# Patient Record
Sex: Female | Born: 1990 | Race: Black or African American | Hispanic: No | Marital: Single | Smoking: Former smoker
Health system: Southern US, Community
[De-identification: ages and names within clinical notes are randomized; demographics above are authoritative.]

## PROBLEM LIST (undated history)

## (undated) ENCOUNTER — Inpatient Hospital Stay: Payer: Self-pay

## (undated) DIAGNOSIS — Z789 Other specified health status: Secondary | ICD-10-CM

## (undated) DIAGNOSIS — O24419 Gestational diabetes mellitus in pregnancy, unspecified control: Secondary | ICD-10-CM

## (undated) DIAGNOSIS — Z803 Family history of malignant neoplasm of breast: Secondary | ICD-10-CM

## (undated) DIAGNOSIS — I10 Essential (primary) hypertension: Secondary | ICD-10-CM

## (undated) HISTORY — DX: Family history of malignant neoplasm of breast: Z80.3

## (undated) HISTORY — DX: Gestational diabetes mellitus in pregnancy, unspecified control: O24.419

## (undated) HISTORY — PX: DILATION AND CURETTAGE OF UTERUS: SHX78

---
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
## 2009-04-16 ENCOUNTER — Emergency Department: Payer: Self-pay | Admitting: Emergency Medicine

## 2011-06-24 ENCOUNTER — Emergency Department: Payer: Self-pay | Admitting: Internal Medicine

## 2013-05-08 ENCOUNTER — Emergency Department: Payer: Self-pay | Admitting: Emergency Medicine

## 2013-05-08 LAB — URINALYSIS, COMPLETE
Bilirubin,UR: NEGATIVE
Blood: NEGATIVE
Glucose,UR: NEGATIVE mg/dL (ref 0–75)
Nitrite: NEGATIVE
Ph: 5 (ref 4.5–8.0)
Protein: 30
RBC,UR: 5 /HPF (ref 0–5)
Specific Gravity: 1.027 (ref 1.003–1.030)
Squamous Epithelial: 6
WBC UR: 14 /HPF (ref 0–5)

## 2013-05-08 LAB — COMPREHENSIVE METABOLIC PANEL
Albumin: 4.2 g/dL (ref 3.4–5.0)
Alkaline Phosphatase: 73 U/L (ref 50–136)
Anion Gap: 6 — ABNORMAL LOW (ref 7–16)
BUN: 14 mg/dL (ref 7–18)
Bilirubin,Total: 0.8 mg/dL (ref 0.2–1.0)
Calcium, Total: 9.5 mg/dL (ref 8.5–10.1)
Chloride: 104 mmol/L (ref 98–107)
Co2: 27 mmol/L (ref 21–32)
Creatinine: 0.74 mg/dL (ref 0.60–1.30)
EGFR (African American): >60
EGFR (Non-African Amer.): >60
Glucose: 96 mg/dL (ref 65–99)
Osmolality: 274 (ref 275–301)
Potassium: 3.2 mmol/L — ABNORMAL LOW (ref 3.5–5.1)
SGOT(AST): 22 U/L (ref 15–37)
SGPT (ALT): 17 U/L (ref 12–78)
Sodium: 137 mmol/L (ref 136–145)
Total Protein: 8.7 g/dL — ABNORMAL HIGH (ref 6.4–8.2)

## 2013-05-08 LAB — CBC
HCT: 41.1 % (ref 35.0–47.0)
HGB: 14.6 g/dL (ref 12.0–16.0)
MCH: 30.3 pg (ref 26.0–34.0)
MCHC: 35.6 g/dL (ref 32.0–36.0)
MCV: 85 fL (ref 80–100)
Platelet: 278 10*3/uL (ref 150–440)
RBC: 4.82 10*6/uL (ref 3.80–5.20)
RDW: 13 % (ref 11.5–14.5)
WBC: 7.7 10*3/uL (ref 3.6–11.0)

## 2013-05-08 LAB — PREGNANCY, URINE: Pregnancy Test, Urine: NEGATIVE m[IU]/mL

## 2013-07-13 ENCOUNTER — Emergency Department: Payer: Self-pay | Admitting: Emergency Medicine

## 2013-07-13 LAB — CBC
HCT: 34.2 % — ABNORMAL LOW (ref 35.0–47.0)
HGB: 12.3 g/dL (ref 12.0–16.0)
MCH: 30.3 pg (ref 26.0–34.0)
MCHC: 36.1 g/dL — ABNORMAL HIGH (ref 32.0–36.0)
MCV: 84 fL (ref 80–100)
Platelet: 268 10*3/uL (ref 150–440)
RBC: 4.08 10*6/uL (ref 3.80–5.20)
RDW: 12.7 % (ref 11.5–14.5)
WBC: 7.7 10*3/uL (ref 3.6–11.0)

## 2013-07-13 LAB — HCG, QUANTITATIVE, PREGNANCY: Beta Hcg, Quant.: 16304 m[IU]/mL — ABNORMAL HIGH

## 2014-01-21 ENCOUNTER — Inpatient Hospital Stay: Payer: Self-pay

## 2014-01-21 LAB — URINALYSIS, COMPLETE
Bacteria: NONE SEEN
Bilirubin,UR: NEGATIVE
Blood: NEGATIVE
Glucose,UR: NEGATIVE mg/dL (ref 0–75)
Leukocyte Esterase: NEGATIVE
Nitrite: NEGATIVE
Ph: 7 (ref 4.5–8.0)
Protein: 30
RBC,UR: 1 /HPF (ref 0–5)
Specific Gravity: 1.027 (ref 1.003–1.030)
Squamous Epithelial: 9
WBC UR: 2 /HPF (ref 0–5)

## 2014-01-21 LAB — COMPREHENSIVE METABOLIC PANEL
Albumin: 3.9 g/dL (ref 3.4–5.0)
Alkaline Phosphatase: 53 U/L
Anion Gap: 12 (ref 7–16)
BUN: 10 mg/dL (ref 7–18)
Bilirubin,Total: 0.5 mg/dL (ref 0.2–1.0)
Calcium, Total: 9.3 mg/dL (ref 8.5–10.1)
Chloride: 105 mmol/L (ref 98–107)
Co2: 22 mmol/L (ref 21–32)
Creatinine: 0.68 mg/dL (ref 0.60–1.30)
EGFR (African American): >60
EGFR (Non-African Amer.): >60
Glucose: 84 mg/dL (ref 65–99)
Osmolality: 276 (ref 275–301)
Potassium: 3.2 mmol/L — ABNORMAL LOW (ref 3.5–5.1)
SGOT(AST): 15 U/L (ref 15–37)
SGPT (ALT): 13 U/L (ref 12–78)
Sodium: 139 mmol/L (ref 136–145)
Total Protein: 8.4 g/dL — ABNORMAL HIGH (ref 6.4–8.2)

## 2014-01-21 LAB — LIPASE, BLOOD: Lipase: 111 U/L (ref 73–393)

## 2014-01-21 LAB — CBC
HCT: 37.5 % (ref 35.0–47.0)
HGB: 13.2 g/dL (ref 12.0–16.0)
MCH: 29.8 pg (ref 26.0–34.0)
MCHC: 35.1 g/dL (ref 32.0–36.0)
MCV: 85 fL (ref 80–100)
Platelet: 270 10*3/uL (ref 150–440)
RBC: 4.43 10*6/uL (ref 3.80–5.20)
RDW: 12.8 % (ref 11.5–14.5)
WBC: 7.7 10*3/uL (ref 3.6–11.0)

## 2014-01-21 LAB — HCG, QUANTITATIVE, PREGNANCY: Beta Hcg, Quant.: 31008 m[IU]/mL — ABNORMAL HIGH

## 2014-01-21 LAB — MAGNESIUM: Magnesium: 1.6 mg/dL — ABNORMAL LOW

## 2014-01-22 LAB — DRUG SCREEN, URINE
Amphetamines, Ur Screen: NEGATIVE (ref ?–1000)
Barbiturates, Ur Screen: NEGATIVE (ref ?–200)
Benzodiazepine, Ur Scrn: NEGATIVE (ref ?–200)
Cannabinoid 50 Ng, Ur ~~LOC~~: POSITIVE (ref ?–50)
Cocaine Metabolite,Ur ~~LOC~~: NEGATIVE (ref ?–300)
MDMA (Ecstasy)Ur Screen: NEGATIVE (ref ?–500)
Methadone, Ur Screen: NEGATIVE (ref ?–300)
Opiate, Ur Screen: NEGATIVE (ref ?–300)
Phencyclidine (PCP) Ur S: NEGATIVE (ref ?–25)
Tricyclic, Ur Screen: NEGATIVE (ref ?–1000)

## 2014-01-22 LAB — BASIC METABOLIC PANEL
Anion Gap: 8 (ref 7–16)
BUN: 3 mg/dL — ABNORMAL LOW (ref 7–18)
Calcium, Total: 9.2 mg/dL (ref 8.5–10.1)
Chloride: 107 mmol/L (ref 98–107)
Co2: 23 mmol/L (ref 21–32)
Creatinine: 0.5 mg/dL — ABNORMAL LOW (ref 0.60–1.30)
EGFR (African American): >60
EGFR (Non-African Amer.): >60
Glucose: 103 mg/dL — ABNORMAL HIGH (ref 65–99)
Osmolality: 272 (ref 275–301)
Potassium: 3.8 mmol/L (ref 3.5–5.1)
Sodium: 138 mmol/L (ref 136–145)

## 2014-01-22 LAB — MAGNESIUM: Magnesium: 2.3 mg/dL

## 2014-01-24 LAB — KETONES, URINE: Ketone: NEGATIVE

## 2014-01-29 ENCOUNTER — Emergency Department: Payer: Self-pay | Admitting: Emergency Medicine

## 2014-01-29 LAB — CBC
HCT: 37.4 % (ref 35.0–47.0)
HGB: 13.2 g/dL (ref 12.0–16.0)
MCH: 29.8 pg (ref 26.0–34.0)
MCHC: 35.4 g/dL (ref 32.0–36.0)
MCV: 84 fL (ref 80–100)
Platelet: 296 10*3/uL (ref 150–440)
RBC: 4.44 10*6/uL (ref 3.80–5.20)
RDW: 12.9 % (ref 11.5–14.5)
WBC: 7 10*3/uL (ref 3.6–11.0)

## 2014-01-29 LAB — URINALYSIS, COMPLETE
Bacteria: NONE SEEN
Bilirubin,UR: NEGATIVE
Glucose,UR: NEGATIVE mg/dL (ref 0–75)
Leukocyte Esterase: NEGATIVE
Nitrite: NEGATIVE
Ph: 6 (ref 4.5–8.0)
Protein: 30
RBC,UR: 2 /HPF (ref 0–5)
Specific Gravity: 1.019 (ref 1.003–1.030)
Squamous Epithelial: 2
WBC UR: 4 /HPF (ref 0–5)

## 2014-01-29 LAB — GC/CHLAMYDIA PROBE AMP

## 2014-01-29 LAB — WET PREP, GENITAL

## 2014-01-29 LAB — HCG, QUANTITATIVE, PREGNANCY: Beta Hcg, Quant.: 51865 m[IU]/mL — ABNORMAL HIGH

## 2014-02-28 ENCOUNTER — Emergency Department: Payer: Self-pay | Admitting: Emergency Medicine

## 2014-02-28 LAB — CBC WITH DIFFERENTIAL/PLATELET
Basophil #: 0.1 10*3/uL (ref 0.0–0.1)
Basophil %: 1.1 %
Eosinophil #: 0.1 10*3/uL (ref 0.0–0.7)
Eosinophil %: 0.8 %
HCT: 35 % (ref 35.0–47.0)
HGB: 12.2 g/dL (ref 12.0–16.0)
Lymphocyte #: 3.4 10*3/uL (ref 1.0–3.6)
Lymphocyte %: 51.3 %
MCH: 29.5 pg (ref 26.0–34.0)
MCHC: 34.8 g/dL (ref 32.0–36.0)
MCV: 85 fL (ref 80–100)
Monocyte #: 0.3 x10 3/mm (ref 0.2–0.9)
Monocyte %: 5.2 %
Neutrophil #: 2.8 10*3/uL (ref 1.4–6.5)
Neutrophil %: 41.6 %
Platelet: 291 10*3/uL (ref 150–440)
RBC: 4.12 10*6/uL (ref 3.80–5.20)
RDW: 13.1 % (ref 11.5–14.5)
WBC: 6.7 10*3/uL (ref 3.6–11.0)

## 2014-02-28 LAB — URINALYSIS, COMPLETE
Bacteria: NONE SEEN
Bilirubin,UR: NEGATIVE
Blood: NEGATIVE
Glucose,UR: NEGATIVE mg/dL (ref 0–75)
Leukocyte Esterase: NEGATIVE
Nitrite: NEGATIVE
Ph: 7 (ref 4.5–8.0)
Protein: NEGATIVE
RBC,UR: NONE SEEN /HPF (ref 0–5)
Specific Gravity: 1.004 (ref 1.003–1.030)
Squamous Epithelial: 1
WBC UR: NONE SEEN /HPF (ref 0–5)

## 2014-02-28 LAB — GC/CHLAMYDIA PROBE AMP

## 2014-02-28 LAB — HCG, QUANTITATIVE, PREGNANCY: Beta Hcg, Quant.: 1317 m[IU]/mL — ABNORMAL HIGH

## 2014-02-28 LAB — WET PREP, GENITAL

## 2014-08-03 ENCOUNTER — Emergency Department: Payer: Self-pay | Admitting: Emergency Medicine

## 2014-08-03 LAB — CBC
HCT: 36.6 % (ref 35.0–47.0)
HGB: 12.6 g/dL (ref 12.0–16.0)
MCH: 29.4 pg (ref 26.0–34.0)
MCHC: 34.5 g/dL (ref 32.0–36.0)
MCV: 85 fL (ref 80–100)
Platelet: 286 10*3/uL (ref 150–440)
RBC: 4.29 10*6/uL (ref 3.80–5.20)
RDW: 13.2 % (ref 11.5–14.5)
WBC: 5.4 10*3/uL (ref 3.6–11.0)

## 2014-08-03 LAB — COMPREHENSIVE METABOLIC PANEL
Albumin: 3.3 g/dL — ABNORMAL LOW (ref 3.4–5.0)
Alkaline Phosphatase: 47 U/L
Anion Gap: 6 — ABNORMAL LOW (ref 7–16)
BUN: 14 mg/dL (ref 7–18)
Bilirubin,Total: 0.4 mg/dL (ref 0.2–1.0)
Calcium, Total: 8.3 mg/dL — ABNORMAL LOW (ref 8.5–10.1)
Chloride: 108 mmol/L — ABNORMAL HIGH (ref 98–107)
Co2: 25 mmol/L (ref 21–32)
Creatinine: 0.7 mg/dL (ref 0.60–1.30)
EGFR (African American): >60
EGFR (Non-African Amer.): >60
Glucose: 85 mg/dL (ref 65–99)
Osmolality: 277 (ref 275–301)
Potassium: 3.9 mmol/L (ref 3.5–5.1)
SGOT(AST): 11 U/L — ABNORMAL LOW (ref 15–37)
SGPT (ALT): 17 U/L
Sodium: 139 mmol/L (ref 136–145)
Total Protein: 7.6 g/dL (ref 6.4–8.2)

## 2014-08-03 LAB — URINALYSIS, COMPLETE
Bacteria: NONE SEEN
Bilirubin,UR: NEGATIVE
Glucose,UR: NEGATIVE mg/dL (ref 0–75)
Nitrite: NEGATIVE
Ph: 6 (ref 4.5–8.0)
Protein: NEGATIVE
RBC,UR: 31 /HPF (ref 0–5)
Specific Gravity: 1.023 (ref 1.003–1.030)
Squamous Epithelial: 3
WBC UR: 11 /HPF (ref 0–5)

## 2014-08-03 LAB — WET PREP, GENITAL

## 2014-08-03 LAB — GC/CHLAMYDIA PROBE AMP

## 2014-08-05 LAB — URINE CULTURE

## 2015-02-04 NOTE — H&P (Signed)
L&D Evaluation:  History Expanded:  HPI 24yo G4P0 at [facility] 2 days by LMP and US in the ER. SHe has had two sab at 9 weeks and 8 weeks and an EAB at 14 weeks. SHe presents to the ER with n/v that will not stop after phenergan and zofran. she has 2+ ketones in her urine, she also has pain in her midepigastric area from the wretching. She just found out she is pregnant. Her ua is clear.   Gravida 4   Term 0   PreTerm 0   Abortion 3   Living 0   Blood Type (Maternal) pending   Group B Strep Results Maternal (Result >5wks must be treated as unknown) unknown/result > 5 weeks ago   Maternal HIV Unknown   Maternal Syphilis Ab Unknown   Maternal Varicella Unknown   Rubella Results (Maternal) unknown   Maternal T-Dap Unknown   [facility]   Presents with nausea/vomiting   Patient's Medical History No Chronic Illness   Patient's Surgical History none   Medications none   Allergies NKDA   Social History none  MJ every day   Family History Non-Contributory   ROS:  ROS All systems were reviewed.  HEENT, CNS, GI, GU, Respiratory, CV, Renal and Musculoskeletal systems were found to be normal.   Exam:  Vital Signs stable   Urine Protein negative dipstick, ketones   General no apparent distress, looks very ill   Mental Status clear   Chest clear   Heart normal sinus rhythm   Abdomen soft NT   Back no CVAT   Edema no edema   Pelvic no external lesions   Mebranes Intact   FHT fhy 160 on us   Skin dry   Lymph no lymphadenopathy   Other dehydrated   Impression:  Impression dehydration, hyperemesis   Plan:  Plan UA, fluids   Comments will replenish with banana bag and iv nausea meds. will advance to [facility] in am if feeling better. will do continuous n/v meds and start zantac for her burning, no pain meds for her epigastric pain as we need to follow this.   Follow Up Appointment need to schedule. in 1 week   Electronic Signatures: Adria DevonKlett, Danyela Posas  (MD)  (Signed 27-Apr-15 21:34)  Authored: L&D Evaluation   Last Updated: 27-Apr-15 21:34 by Adria DevonKlett, Renesmay Nesbitt (MD)

## 2015-04-01 ENCOUNTER — Emergency Department
Admission: EM | Admit: 2015-04-01 | Discharge: 2015-04-01 | Disposition: A | Discharge status: Home / Self Care | Payer: 59 | Attending: Emergency Medicine | Admitting: Emergency Medicine

## 2015-04-01 ENCOUNTER — Encounter: Payer: Self-pay | Admitting: Emergency Medicine

## 2015-04-01 DIAGNOSIS — Z720 Tobacco use: Secondary | ICD-10-CM | POA: Insufficient documentation

## 2015-04-01 DIAGNOSIS — J029 Acute pharyngitis, unspecified: Secondary | ICD-10-CM | POA: Diagnosis present

## 2015-04-01 DIAGNOSIS — J020 Streptococcal pharyngitis: Secondary | ICD-10-CM | POA: Insufficient documentation

## 2015-04-01 MED ORDER — MAGIC MOUTHWASH W/LIDOCAINE: 5.0000 mL | Freq: Four times a day (QID) | ORAL | Status: DC

## 2015-04-01 MED ORDER — MAGIC MOUTHWASH
10.0000 mL | Freq: Once | ORAL | Status: AC
  Administered 2015-04-01: 10 mL via ORAL
  Filled 2015-04-01: qty 10

## 2015-04-01 MED ORDER — AMOXICILLIN 500 MG PO CAPS: 500.0000 mg | ORAL_CAPSULE | Freq: Three times a day (TID) | ORAL | Status: DC

## 2015-04-01 NOTE — ED Notes (Signed)
Swollen tonsils since yesterday

## 2015-04-01 NOTE — Discharge Instructions (Signed)

## 2015-04-01 NOTE — ED Provider Notes (Signed)
[facility] Emergency Department Provider Note  ____________________________________________  Time seen: Approximately 7:59 AM  I have reviewed the triage vital signs and the nursing notes.   HISTORY  Chief Complaint Sore Throat    HPI Sierra Jordan is a 24 y.o. female  complaining of swollen tonsils were started yesterday. Patient denies any URI signs and symptoms. Patient denies any nausea, vomiting or diarrhea. Patient's is painful to swallow although she can tolerate fluids. Patient rated her pain discomfort as a 5/10. Describes the pain as sharp swallowing.   History reviewed. No pertinent past medical history.  There are no active problems to display for this patient.   History reviewed. No pertinent past surgical history.  Current Outpatient Rx  Name  Route  Sig  Dispense  Refill  . Alum & Mag Hydroxide-Simeth (MAGIC MOUTHWASH W/LIDOCAINE) SOLN   Oral   Take 5 mLs by mouth 4 (four) times daily.   100 mL   0   . amoxicillin (AMOXIL) 500 MG capsule   Oral   Take 1 capsule (500 mg total) by mouth 3 (three) times daily.   30 capsule   0     Allergies Review of patient's allergies indicates no known allergies.  No family history on file.  Social History History  Substance Use Topics  . Smoking status: Current Every Day Smoker  . Smokeless tobacco: Not on file  . Alcohol Use: Yes    Review of Systems Constitutional: No fever/chills Eyes: No visual changes. ENT service throat and swollen glands.  Cardiovascular: Denies chest pain. Respiratory: Denies shortness of breath. Gastrointestinal: No abdominal pain.  No nausea, no vomiting.  No diarrhea.  No constipation. Genitourinary: Negative for dysuria. Musculoskeletal: Negative for back pain. Skin: Negative for rash. Neurological: Negative for headaches, focal weakness or numbness. 10-point ROS otherwise negative.  ____________________________________________   PHYSICAL  EXAM:  VITAL SIGNS: ED Triage Vitals  Enc Vitals Group     BP 04/01/15 0740 122/80 mmHg     Pulse Rate 04/01/15 0740 76     Resp 04/01/15 0740 18     Temp 04/01/15 0740 98.3 F (36.8 C)     Temp Source 04/01/15 0740 Oral     SpO2 04/01/15 0740 100 %     Weight 04/01/15 0740 205 lb (92.987 kg)     Height 04/01/15 0740 5\' 4"  (1.626 m)     Head Cir --      Peak Flow --      Pain Score --      Pain Loc --      Pain Edu? --      Excl. in GC? --     Constitutional: Alert and oriented. Well appearing and in no acute distress. Eyes: Conjunctivae are normal. PERRL. EOMI. Head: Atraumatic. Nose: No congestion/rhinnorhea. Mouth/Throat: Mucous membranes are moist.  Oropharynx  erythematous. Bilateral exudative tonsils which are edematous  Neck: No stridor.No cervical spine tenderness to palpation. Hematological/Lymphatic/Immunilogical: Bilateral cervical lymphadenopathy. Cardiovascular: Normal rate, regular rhythm. Grossly normal heart sounds.  Good peripheral circulation. Respiratory: Normal respiratory effort.  No retractions. Lungs CTAB. Gastrointestinal: Soft and nontender. No distention. No abdominal bruits. No CVA tenderness. Musculoskeletal: No lower extremity tenderness nor edema.  No joint effusions. Neurologic:  Normal speech and language. No gross focal neurologic deficits are appreciated. Speech is normal. No gait instability. Skin:  Skin is warm, dry and intact. No rash noted. Psychiatric: Mood and affect are normal. Speech and behavior are normal.  ____________________________________________  LABS (all labs ordered are listed, but only abnormal results are displayed)  Labs Reviewed - No data to display ____________________________________ positive rapid strep ________   ____________________________________________  RADIOLOGY   ____________________________________________   PROCEDURES  Procedure(s) performed: None  Critical Care performed:  No  ____________________________________________   INITIAL IMPRESSION / ASSESSMENT AND PLAN / ED COURSE  Pertinent labs & imaging results that were available during my care of the patient were reviewed by me and considered in my medical decision making (see chart for details).  Strep pharyngitis. Patient given Magic mouthwash is given immediate relief. Patient advised to take amoxicillin's Magic mouthwash as needed. Patient given a work note for today. Patient advised follow-up family doctor "clinic. Patient advised return ER if condition worsens. ____________________________________________   FINAL CLINICAL IMPRESSION(S) / ED DIAGNOSES  Final diagnoses:  Strep pharyngitis      Joni Reining, PA-C 04/01/15 1027  Loleta Rose, MD 04/05/15 [phone]

## 2015-04-04 LAB — POCT RAPID STREP A: Streptococcus, Group A Screen (Direct): POSITIVE — AB

## 2016-03-13 ENCOUNTER — Encounter: Payer: Self-pay | Admitting: Emergency Medicine

## 2016-03-13 DIAGNOSIS — Z792 Long term (current) use of antibiotics: Secondary | ICD-10-CM | POA: Diagnosis not present

## 2016-03-13 DIAGNOSIS — F17200 Nicotine dependence, unspecified, uncomplicated: Secondary | ICD-10-CM | POA: Insufficient documentation

## 2016-03-13 DIAGNOSIS — O26891 Other specified pregnancy related conditions, first trimester: Secondary | ICD-10-CM | POA: Diagnosis not present

## 2016-03-13 DIAGNOSIS — O209 Hemorrhage in early pregnancy, unspecified: Secondary | ICD-10-CM | POA: Diagnosis present

## 2016-03-13 DIAGNOSIS — O200 Threatened abortion: Secondary | ICD-10-CM | POA: Diagnosis not present

## 2016-03-13 DIAGNOSIS — Z3A01 Less than 8 weeks gestation of pregnancy: Secondary | ICD-10-CM | POA: Diagnosis not present

## 2016-03-13 DIAGNOSIS — N760 Acute vaginitis: Secondary | ICD-10-CM | POA: Diagnosis not present

## 2016-03-13 LAB — POCT PREGNANCY, URINE: Preg Test, Ur: POSITIVE — AB

## 2016-03-13 NOTE — ED Notes (Signed)
Patient states that she is [redacted] weeks pregnant. Patient reports that she has had vaginal bleeding and abd cramping times 3 days. Patient reports that she has had 3 previous miscarriages and 2 abortions.

## 2016-03-14 ENCOUNTER — Emergency Department: Payer: Medicaid Other

## 2016-03-14 ENCOUNTER — Emergency Department
Admission: EM | Admit: 2016-03-14 | Discharge: 2016-03-14 | Disposition: A | Discharge status: Home / Self Care | Payer: Medicaid Other | Attending: Emergency Medicine | Admitting: Emergency Medicine

## 2016-03-14 DIAGNOSIS — B9689 Other specified bacterial agents as the cause of diseases classified elsewhere: Secondary | ICD-10-CM

## 2016-03-14 DIAGNOSIS — N760 Acute vaginitis: Secondary | ICD-10-CM

## 2016-03-14 DIAGNOSIS — O200 Threatened abortion: Secondary | ICD-10-CM

## 2016-03-14 LAB — CBC WITH DIFFERENTIAL/PLATELET
Basophils Absolute: 0.2 10*3/uL — ABNORMAL HIGH (ref 0–0.1)
Basophils Relative: 2 %
Eosinophils Absolute: 0.1 10*3/uL (ref 0–0.7)
Eosinophils Relative: 1 %
HCT: 33.2 % — ABNORMAL LOW (ref 35.0–47.0)
Hemoglobin: 12 g/dL (ref 12.0–16.0)
Lymphocytes Relative: 41 %
Lymphs Abs: 3.5 10*3/uL (ref 1.0–3.6)
MCH: 29.6 pg (ref 26.0–34.0)
MCHC: 36 g/dL (ref 32.0–36.0)
MCV: 82.3 fL (ref 80.0–100.0)
Monocytes Absolute: 0.6 10*3/uL (ref 0.2–0.9)
Monocytes Relative: 6 %
Neutro Abs: 4.3 10*3/uL (ref 1.4–6.5)
Neutrophils Relative %: 50 %
Platelets: 251 10*3/uL (ref 150–440)
RBC: 4.04 MIL/uL (ref 3.80–5.20)
RDW: 12.8 % (ref 11.5–14.5)
WBC: 8.5 10*3/uL (ref 3.6–11.0)

## 2016-03-14 LAB — URINALYSIS COMPLETE WITH MICROSCOPIC (ARMC ONLY)
Bacteria, UA: NONE SEEN
Bilirubin Urine: NEGATIVE
Glucose, UA: NEGATIVE mg/dL
Hgb urine dipstick: NEGATIVE
Ketones, ur: NEGATIVE mg/dL
Leukocytes, UA: NEGATIVE
Nitrite: NEGATIVE
Protein, ur: NEGATIVE mg/dL
RBC / HPF: NONE SEEN RBC/hpf (ref 0–5)
Specific Gravity, Urine: 1.026 (ref 1.005–1.030)
pH: 6 (ref 5.0–8.0)

## 2016-03-14 LAB — CHLAMYDIA/NGC RT PCR (ARMC ONLY)
Chlamydia Tr: NOT DETECTED
N gonorrhoeae: NOT DETECTED

## 2016-03-14 LAB — ABO/RH: ABO/RH(D): A POS

## 2016-03-14 LAB — HCG, QUANTITATIVE, PREGNANCY: hCG, Beta Chain, Quant, S: 7830 m[IU]/mL — ABNORMAL HIGH (ref <5)

## 2016-03-14 LAB — WET PREP, GENITAL
Sperm: NONE SEEN
Trich, Wet Prep: NONE SEEN
Yeast Wet Prep HPF POC: NONE SEEN

## 2016-03-14 MED ORDER — METRONIDAZOLE 0.75 % VA GEL: 1.0000 | Freq: Every day | VAGINAL | Status: DC

## 2016-03-14 NOTE — ED Provider Notes (Signed)
[facility] Emergency Department Provider Note   ____________________________________________  Time seen: Approximately 1:47 AM  I have reviewed the triage vital signs and the nursing notes.   HISTORY  Chief Complaint Vaginal Bleeding    HPI Sierra Jordan is a 25 y.o. female who presents to the ED from home with a chief complaint of vaginal bleeding. Patient reports she is approximately [redacted] weeks pregnant, [id number] who began to have vaginal bleeding x 2 days; tonight passing clots. Last sexual intercourse 2 weeks ago. Denies associated lightheadedness, chest pain, shortness of breath, nausea, burning, dysuria, diarrhea. Denies recent travel or trauma. Nothing makes her symptoms better or worse.   Past medical history None  There are no active problems to display for this patient.   History reviewed. No pertinent past surgical history.  Current Outpatient Rx  Name  Route  Sig  Dispense  Refill  . Alum & Mag Hydroxide-Simeth (MAGIC MOUTHWASH W/LIDOCAINE) SOLN   Oral   Take 5 mLs by mouth 4 (four) times daily.   100 mL   0   . amoxicillin (AMOXIL) 500 MG capsule   Oral   Take 1 capsule (500 mg total) by mouth 3 (three) times daily.   30 capsule   0   . metroNIDAZOLE (METROGEL VAGINAL) 0.75 % vaginal gel   Vaginal   Place 1 Applicatorful vaginally at bedtime. x5 nights   70 g   1     Allergies Review of patient's allergies indicates no known allergies.  No family history on file.  Social History Social History  Substance Use Topics  . Smoking status: Current Every Day Smoker  . Smokeless tobacco: None  . Alcohol Use: Yes    Review of Systems  Constitutional: No fever/chills. Eyes: No visual changes. ENT: No sore throat. Cardiovascular: Denies chest pain. Respiratory: Denies shortness of breath. Gastrointestinal: No abdominal pain.  No nausea, no vomiting.  No diarrhea.  No constipation. Genitourinary: Positive for vaginal  bleeding. Negative for dysuria. Musculoskeletal: Negative for back pain. Skin: Negative for rash. Neurological: Negative for headaches, focal weakness or numbness.  10-point ROS otherwise negative.  ____________________________________________   PHYSICAL EXAM:  VITAL SIGNS: ED Triage Vitals  Enc Vitals Group     BP 03/13/16 2335 117/72 mmHg     Pulse Rate 03/13/16 2335 118     Resp 03/13/16 2335 18     Temp 03/13/16 2335 98.1 F (36.7 C)     Temp Source 03/13/16 2335 Oral     SpO2 03/13/16 2335 98 %     Weight 03/13/16 2335 210 lb (95.255 kg)     Height 03/13/16 2335  (1.6 m)     Head Cir --      Peak Flow --      Pain Score 03/13/16 2336 5     Pain Loc --      Pain Edu? --      Excl. in GC? --     Constitutional: Alert and oriented. Well appearing and in no acute distress. Eyes: Conjunctivae are normal. PERRL. EOMI. Head: Atraumatic. Nose: No congestion/rhinnorhea. Mouth/Throat: Mucous membranes are moist.  Oropharynx non-erythematous. Neck: No stridor.   Cardiovascular: Normal rate, regular rhythm. Grossly normal heart sounds.  Good peripheral circulation. Respiratory: Normal respiratory effort.  No retractions. Lungs CTAB. Gastrointestinal: Soft and nontender. No distention. No abdominal bruits. No CVA tenderness. Musculoskeletal: No lower extremity tenderness nor edema.  No joint effusions. Neurologic:  Normal speech and language. No  gross focal neurologic deficits are appreciated. No gait instability. Skin:  Skin is warm, dry and intact. No rash noted. Psychiatric: Mood and affect are normal. Speech and behavior are normal.  ____________________________________________   LABS (all labs ordered are listed, but only abnormal results are displayed)  Labs Reviewed  WET PREP, GENITAL - Abnormal; Notable for the following:    Clue Cells Wet Prep HPF POC PRESENT (*)    WBC, Wet Prep HPF POC FEW (*)    All other components within normal limits  HCG,  QUANTITATIVE, PREGNANCY - Abnormal; Notable for the following:    hCG, Beta Chain, Quant, S 7830 (*)    All other components within normal limits  CBC WITH DIFFERENTIAL/PLATELET - Abnormal; Notable for the following:    HCT 33.2 (*)    Basophils Absolute 0.2 (*)    All other components within normal limits  URINALYSIS COMPLETEWITH MICROSCOPIC ([facility] ONLY) - Abnormal; Notable for the following:    Color, Urine YELLOW (*)    APPearance CLEAR (*)    Squamous Epithelial / LPF 0-5 (*)    All other components within normal limits  POCT PREGNANCY, URINE - Abnormal; Notable for the following:    Preg Test, Ur POSITIVE (*)    All other components within normal limits  CHLAMYDIA/NGC RT PCR ([facility] ONLY)  ABO/RH   ____________________________________________  EKG  None ____________________________________________  RADIOLOGY  OB ultrasound interpreted per Dr. Manus GunningEhinger: Probable early intrauterine gestational sac and yolk sac, but no fetal pole, or cardiac activity yet visualized. Recommend follow-up quantitative B-HCG levels and follow-up US in 10 days to confirm and assess viability. ____________________________________________   PROCEDURES  Procedure(s) performed:   Pelvic exam: External exam WNL without rashes, lesions or vesicles. Speculum exam reveals no vaginal bleeding; mild discharge. Cervical os closed. Bimanual exam WNL.  Critical Care performed: No  ____________________________________________   INITIAL IMPRESSION / ASSESSMENT AND PLAN / ED COURSE  Pertinent labs & imaging results that were available during my care of the patient were reviewed by me and considered in my medical decision making (see chart for details).  25 year old female approximately [redacted] weeks pregnant with reported vaginal bleeding and pelvic cramping. Laboratory and urinalysis results are unremarkable. Will proceed with OB ultrasound.  ----------------------------------------- 3:26 AM on  03/14/2016 -----------------------------------------  Updated patient of imaging results. Prescription for MetroGel, advised pelvic rest and close follow-up with OB. Strict return precautions given. Patient verbalizes understanding and agrees with plan of care. ____________________________________________   FINAL CLINICAL IMPRESSION(S) / ED DIAGNOSES  Final diagnoses:  Threatened abortion in first trimester  Bacterial vaginosis      NEW MEDICATIONS STARTED DURING THIS VISIT:  New Prescriptions   METRONIDAZOLE (METROGEL VAGINAL) 0.75 % VAGINAL GEL    Place 1 Applicatorful vaginally at bedtime. x5 nights     Note:  This document was prepared using Dragon voice recognition software and may include unintentional dictation errors.    Irean HongJade J Sung, MD 03/14/16 [phone]

## 2016-03-14 NOTE — Discharge Instructions (Signed)
1. Apply MetroGel vaginal suppositories nightly 5 nights. 2. Avoid using tampons, douching or sexual intercourse until seen by your doctor. 3. Return to the ER for worsening symptoms, soaking more than 1 pad per hour, feeling faint or other concerns.  Bacterial Vaginosis Bacterial vaginosis is an infection of the vagina. It happens when too many germs (bacteria) grow in the vagina. Having this infection puts you at risk for getting other infections from sex. Treating this infection can help lower your risk for other infections, such as:   Chlamydia.  Gonorrhea.  HIV.  Herpes. HOME CARE  Take your medicine as told by your doctor.  Finish your medicine even if you start to feel better.  Tell your sex partner that you have an infection. They should see their doctor for treatment.  During treatment:  Avoid sex or use condoms correctly.  Do not douche.  Do not drink alcohol unless your doctor tells you it is ok.  Do not breastfeed unless your doctor tells you it is ok. GET HELP IF:  You are not getting better after 3 days of treatment.  You have more grey fluid (discharge) coming from your vagina than before.  You have more pain than before.  You have a fever. MAKE SURE YOU:   Understand these instructions.  Will watch your condition.  Will get help right away if you are not doing well or get worse.   This information is not intended to replace advice given to you by your health care provider. Make sure you discuss any questions you have with your health care provider.   Document Released: 06/22/2008 Document Revised: 10/04/2014 Document Reviewed: 04/25/2013 Elsevier Interactive Patient Education 2016 [employer]  Vaginal Bleeding During Pregnancy, First Trimester A small amount of bleeding (spotting) from the vagina is common in early pregnancy. Sometimes the bleeding is normal and is not a problem, and sometimes it is a sign of something serious. Be sure to tell  your doctor about any bleeding from your vagina right away. HOME CARE  Watch your condition for any changes.  Follow your doctor's instructions about how active you can be.  If you are on bed rest:  You may need to stay in bed and only get up to use the bathroom.  You may be allowed to do some activities.  If you need help, make plans for someone to help you.  Write down:  The number of pads you use each day.  How often you change pads.  How soaked (saturated) your pads are.  Do not use tampons.  Do not douche.  Do not have sex or orgasms until your doctor says it is okay.  If you pass any tissue from your vagina, save the tissue so you can show it to your doctor.  Only take medicines as told by your doctor.  Do not take aspirin because it can make you bleed.  Keep all follow-up visits as told by your doctor. GET HELP IF:   You bleed from your vagina.  You have cramps.  You have labor pains.  You have a fever that does not go away after you take medicine. GET HELP RIGHT AWAY IF:   You have very bad cramps in your back or belly (abdomen).  You pass large clots or tissue from your vagina.  You bleed more.  You feel light-headed or weak.  You pass out (faint).  You have chills.  You are leaking fluid or have a gush of fluid  from your vagina.  You pass out while pooping (having a bowel movement). MAKE SURE YOU:  Understand these instructions.  Will watch your condition.  Will get help right away if you are not doing well or get worse.   This information is not intended to replace advice given to you by your health care provider. Make sure you discuss any questions you have with your health care provider.   Document Released: 01/28/2014 Document Reviewed: 01/28/2014 Elsevier Interactive Patient Education [employer]  Threatened Miscarriage A threatened miscarriage is when you have vaginal bleeding during your first 20 weeks of pregnancy  but the pregnancy has not ended. Your doctor will do tests to make sure you are still pregnant. The cause of the bleeding may not be known. This condition does not mean your pregnancy will end. It does increase the risk of it ending (complete miscarriage). HOME CARE   Make sure you keep all your doctor visits for prenatal care.  Get plenty of rest.  Do not have sex or use tampons if you have vaginal bleeding.  Do not douche.  Do not smoke or use drugs.  Do not drink alcohol.  Avoid caffeine. GET HELP IF:  You have light bleeding from your vagina.  You have belly pain or cramping.  You have a fever. GET HELP RIGHT AWAY IF:   You have heavy bleeding from your vagina.  You have clots of blood coming from your vagina.  You have bad pain or cramps in your low back or belly.  You have fever, chills, and bad belly pain. MAKE SURE YOU:   Understand these instructions.  Will watch your condition.  Will get help right away if you are not doing well or get worse.   This information is not intended to replace advice given to you by your health care provider. Make sure you discuss any questions you have with your health care provider.   Document Released: 08/26/2008 Document Revised: 09/18/2013 Document Reviewed: 07/10/2013 Elsevier Interactive Patient Education 2016 [employer] Inc[employer]  Pelvic Rest Pelvic rest is sometimes recommended for women when:   The placenta is partially or completely covering the opening of the cervix (placenta previa).  There is bleeding between the uterine wall and the amniotic sac in the first trimester (subchorionic hemorrhage).  The cervix begins to open without labor starting (incompetent cervix, cervical insufficiency).  The labor is too early (preterm labor). HOME CARE INSTRUCTIONS  Do not have sexual intercourse, stimulation, or an orgasm.  Do not use tampons, douche, or put anything in the vagina.  Do not lift anything over 10 pounds (4.5  kg).  Avoid strenuous activity or straining your pelvic muscles. SEEK MEDICAL CARE IF:  You have any vaginal bleeding during pregnancy. Treat this as a potential emergency.  You have cramping pain felt low in the stomach (stronger than menstrual cramps).  You notice vaginal discharge (watery, mucus, or bloody).  You have a low, dull backache.  There are regular contractions or uterine tightening. SEEK IMMEDIATE MEDICAL CARE IF: You have vaginal bleeding and have placenta previa.    This information is not intended to replace advice given to you by your health care provider. Make sure you discuss any questions you have with your health care provider.   Document Released: 01/08/2011 Document Revised: 12/06/2011 Document Reviewed: 03/17/2015 Elsevier Interactive Patient Education [employer]

## 2016-03-14 NOTE — ED Notes (Signed)
Discharge instructions reviewed with patient. Patient verbalized understanding. Patient ambulated to lobby without difficulty.   

## 2016-04-17 ENCOUNTER — Encounter: Payer: Self-pay | Admitting: Emergency Medicine

## 2016-04-17 DIAGNOSIS — O210 Mild hyperemesis gravidarum: Secondary | ICD-10-CM | POA: Insufficient documentation

## 2016-04-17 DIAGNOSIS — Z792 Long term (current) use of antibiotics: Secondary | ICD-10-CM | POA: Insufficient documentation

## 2016-04-17 DIAGNOSIS — O99511 Diseases of the respiratory system complicating pregnancy, first trimester: Secondary | ICD-10-CM | POA: Insufficient documentation

## 2016-04-17 DIAGNOSIS — R05 Cough: Secondary | ICD-10-CM | POA: Insufficient documentation

## 2016-04-17 DIAGNOSIS — Z3A10 10 weeks gestation of pregnancy: Secondary | ICD-10-CM | POA: Diagnosis not present

## 2016-04-17 DIAGNOSIS — O219 Vomiting of pregnancy, unspecified: Secondary | ICD-10-CM | POA: Diagnosis present

## 2016-04-17 DIAGNOSIS — R509 Fever, unspecified: Secondary | ICD-10-CM | POA: Insufficient documentation

## 2016-04-17 LAB — CBC
HCT: 35.2 % (ref 35.0–47.0)
Hemoglobin: 12.6 g/dL (ref 12.0–16.0)
MCH: 29.5 pg (ref 26.0–34.0)
MCHC: 35.8 g/dL (ref 32.0–36.0)
MCV: 82.3 fL (ref 80.0–100.0)
Platelets: 285 10*3/uL (ref 150–440)
RBC: 4.27 MIL/uL (ref 3.80–5.20)
RDW: 12.6 % (ref 11.5–14.5)
WBC: 9.4 10*3/uL (ref 3.6–11.0)

## 2016-04-17 LAB — URINALYSIS COMPLETE WITH MICROSCOPIC (ARMC ONLY)
Bacteria, UA: NONE SEEN
Bilirubin Urine: NEGATIVE
Glucose, UA: NEGATIVE mg/dL
Hgb urine dipstick: NEGATIVE
Leukocytes, UA: NEGATIVE
Nitrite: NEGATIVE
Protein, ur: 100 mg/dL — AB
Specific Gravity, Urine: 1.028 (ref 1.005–1.030)
pH: 6 (ref 5.0–8.0)

## 2016-04-17 LAB — COMPREHENSIVE METABOLIC PANEL
ALT: 17 U/L (ref 14–54)
AST: 20 U/L (ref 15–41)
Albumin: 4.1 g/dL (ref 3.5–5.0)
Alkaline Phosphatase: 45 U/L (ref 38–126)
Anion gap: 10 (ref 5–15)
BUN: 15 mg/dL (ref 6–20)
CO2: 20 mmol/L — ABNORMAL LOW (ref 22–32)
Calcium: 9.6 mg/dL (ref 8.9–10.3)
Chloride: 106 mmol/L (ref 101–111)
Creatinine, Ser: 0.38 mg/dL — ABNORMAL LOW (ref 0.44–1.00)
GFR calc Af Amer: >60 mL/min (ref >60)
GFR calc non Af Amer: >60 mL/min (ref >60)
Glucose, Bld: 113 mg/dL — ABNORMAL HIGH (ref 65–99)
Potassium: 3.4 mmol/L — ABNORMAL LOW (ref 3.5–5.1)
Sodium: 136 mmol/L (ref 135–145)
Total Bilirubin: 0.5 mg/dL (ref 0.3–1.2)
Total Protein: 8 g/dL (ref 6.5–8.1)

## 2016-04-17 LAB — POCT PREGNANCY, URINE: Preg Test, Ur: POSITIVE — AB

## 2016-04-17 LAB — LIPASE, BLOOD: Lipase: 21 U/L (ref 11–51)

## 2016-04-17 MED ORDER — METOCLOPRAMIDE HCL 5 MG/ML IJ SOLN
10.0000 mg | Freq: Once | INTRAMUSCULAR | Status: AC
  Administered 2016-04-17: 10 mg via INTRAVENOUS

## 2016-04-17 MED ORDER — METOCLOPRAMIDE HCL 5 MG/ML IJ SOLN
INTRAMUSCULAR | Status: AC
  Administered 2016-04-17: 10 mg via INTRAVENOUS
  Filled 2016-04-17: qty 2

## 2016-04-17 MED ORDER — SODIUM CHLORIDE 0.9 % IV BOLUS (SEPSIS)
1000.0000 mL | Freq: Once | INTRAVENOUS | Status: AC
  Administered 2016-04-17: 1000 mL via INTRAVENOUS

## 2016-04-17 NOTE — ED Notes (Signed)
Patient reports that she is [redacted] weeks pregnant and has nausea and vomiting all day. Patient reports decrease intake from the nausea.

## 2016-04-18 ENCOUNTER — Emergency Department
Admission: EM | Admit: 2016-04-18 | Discharge: 2016-04-18 | Disposition: A | Discharge status: Home / Self Care | Payer: Medicaid Other | Attending: Emergency Medicine | Admitting: Emergency Medicine

## 2016-04-18 MED ORDER — METOCLOPRAMIDE HCL 5 MG/ML IJ SOLN
INTRAMUSCULAR | Status: AC
  Filled 2016-04-18: qty 2

## 2016-04-18 MED ORDER — ALUM & MAG HYDROXIDE-SIMETH 200-200-20 MG/5ML PO SUSP
ORAL | Status: AC
  Filled 2016-04-18: qty 30

## 2016-04-18 NOTE — ED Notes (Signed)
Pt states is [redacted] weeks pregnant and has had nausea and vomiting since this am. Pt states she is not able to hold fluids down. Pt states has generalized abd pain with vomiting. Pt denies known fever or diarrhea. Pt states has had chills. Pt appears in no acute distress with moist oral mucus membranes, skin normal color warm and dry. Call bell at side, warm blankets provided and explanation of delay provided. Pt verbalizes understanding. Pt denies vaginal discharge or bleeding.

## 2016-04-22 ENCOUNTER — Encounter: Payer: Self-pay | Admitting: Emergency Medicine

## 2016-04-22 ENCOUNTER — Observation Stay
Admission: EM | Admit: 2016-04-22 | Discharge: 2016-04-24 | Disposition: A | Discharge status: Home / Self Care | Payer: Medicaid Other | Attending: Obstetrics and Gynecology | Admitting: Obstetrics and Gynecology

## 2016-04-22 DIAGNOSIS — O211 Hyperemesis gravidarum with metabolic disturbance: Secondary | ICD-10-CM | POA: Diagnosis not present

## 2016-04-22 DIAGNOSIS — E876 Hypokalemia: Secondary | ICD-10-CM

## 2016-04-22 DIAGNOSIS — Z3A11 11 weeks gestation of pregnancy: Secondary | ICD-10-CM | POA: Diagnosis not present

## 2016-04-22 DIAGNOSIS — O99281 Endocrine, nutritional and metabolic diseases complicating pregnancy, first trimester: Secondary | ICD-10-CM | POA: Diagnosis not present

## 2016-04-22 DIAGNOSIS — R824 Acetonuria: Secondary | ICD-10-CM | POA: Diagnosis not present

## 2016-04-22 DIAGNOSIS — E860 Dehydration: Secondary | ICD-10-CM | POA: Diagnosis not present

## 2016-04-22 DIAGNOSIS — O26891 Other specified pregnancy related conditions, first trimester: Secondary | ICD-10-CM | POA: Insufficient documentation

## 2016-04-22 DIAGNOSIS — Z87891 Personal history of nicotine dependence: Secondary | ICD-10-CM | POA: Insufficient documentation

## 2016-04-22 DIAGNOSIS — O210 Mild hyperemesis gravidarum: Secondary | ICD-10-CM | POA: Diagnosis present

## 2016-04-22 HISTORY — DX: Hyperemesis gravidarum with metabolic disturbance: O21.1

## 2016-04-22 LAB — URINALYSIS COMPLETE WITH MICROSCOPIC (ARMC ONLY)
Bacteria, UA: NONE SEEN
Glucose, UA: NEGATIVE mg/dL
Hgb urine dipstick: NEGATIVE
Leukocytes, UA: NEGATIVE
Nitrite: NEGATIVE
Protein, ur: 100 mg/dL — AB
Specific Gravity, Urine: 1.03 (ref 1.005–1.030)
pH: 6 (ref 5.0–8.0)

## 2016-04-22 LAB — COMPREHENSIVE METABOLIC PANEL WITH GFR
ALT: 51 U/L (ref 14–54)
AST: 48 U/L — ABNORMAL HIGH (ref 15–41)
Albumin: 4.2 g/dL (ref 3.5–5.0)
Alkaline Phosphatase: 45 U/L (ref 38–126)
Anion gap: 11 (ref 5–15)
BUN: 10 mg/dL (ref 6–20)
CO2: 24 mmol/L (ref 22–32)
Calcium: 9.3 mg/dL (ref 8.9–10.3)
Chloride: 100 mmol/L — ABNORMAL LOW (ref 101–111)
Creatinine, Ser: 0.49 mg/dL (ref 0.44–1.00)
GFR calc Af Amer: >60 mL/min
GFR calc non Af Amer: >60 mL/min
Glucose, Bld: 115 mg/dL — ABNORMAL HIGH (ref 65–99)
Potassium: 2.8 mmol/L — ABNORMAL LOW (ref 3.5–5.1)
Sodium: 135 mmol/L (ref 135–145)
Total Bilirubin: 1.1 mg/dL (ref 0.3–1.2)
Total Protein: 8.1 g/dL (ref 6.5–8.1)

## 2016-04-22 LAB — CBC
HCT: 36.5 % (ref 35.0–47.0)
Hemoglobin: 13.1 g/dL (ref 12.0–16.0)
MCH: 29.7 pg (ref 26.0–34.0)
MCHC: 35.9 g/dL (ref 32.0–36.0)
MCV: 82.8 fL (ref 80.0–100.0)
Platelets: 312 10*3/uL (ref 150–440)
RBC: 4.41 MIL/uL (ref 3.80–5.20)
RDW: 12.7 % (ref 11.5–14.5)
WBC: 9 10*3/uL (ref 3.6–11.0)

## 2016-04-22 LAB — HCG, QUANTITATIVE, PREGNANCY: hCG, Beta Chain, Quant, S: 90125 m[IU]/mL — ABNORMAL HIGH (ref <5)

## 2016-04-22 LAB — LIPASE, BLOOD: Lipase: 19 U/L (ref 11–51)

## 2016-04-22 MED ORDER — FAMOTIDINE 20 MG PO TABS: 10.0000 mg | ORAL_TABLET | Freq: Two times a day (BID) | ORAL | Status: DC

## 2016-04-22 MED ORDER — LACTATED RINGERS IV SOLN: INTRAVENOUS | Status: DC

## 2016-04-22 MED ORDER — CALCIUM CARBONATE ANTACID 500 MG PO CHEW
2.0000 | CHEWABLE_TABLET | ORAL | Status: DC | PRN
  Administered 2016-04-22: 400 mg via ORAL
  Filled 2016-04-22: qty 2

## 2016-04-22 MED ORDER — SODIUM CHLORIDE 0.9 % IV SOLN
25.0000 mg | Freq: Four times a day (QID) | INTRAVENOUS | Status: DC | PRN
  Filled 2016-04-22: qty 1

## 2016-04-22 MED ORDER — ZOLPIDEM TARTRATE 5 MG PO TABS: 5.0000 mg | ORAL_TABLET | Freq: Every evening | ORAL | Status: DC | PRN

## 2016-04-22 MED ORDER — PROMETHAZINE HCL 25 MG/ML IJ SOLN
25.0000 mg | Freq: Four times a day (QID) | INTRAMUSCULAR | Status: DC | PRN
  Administered 2016-04-22 – 2016-04-23 (×4): 25 mg via INTRAVENOUS
  Filled 2016-04-22 (×4): qty 1

## 2016-04-22 MED ORDER — ACETAMINOPHEN 325 MG PO TABS: 650.0000 mg | ORAL_TABLET | ORAL | Status: DC | PRN

## 2016-04-22 MED ORDER — METOCLOPRAMIDE HCL 5 MG/ML IJ SOLN
10.0000 mg | INTRAMUSCULAR | Status: DC | PRN
  Administered 2016-04-22 – 2016-04-23 (×4): 10 mg via INTRAVENOUS
  Filled 2016-04-22 (×4): qty 2

## 2016-04-22 MED ORDER — SODIUM CHLORIDE 0.9 % IJ SOLN
INTRAMUSCULAR | Status: AC
  Filled 2016-04-22: qty 10

## 2016-04-22 MED ORDER — PROMETHAZINE HCL 25 MG RE SUPP
50.0000 mg | RECTAL | Status: DC | PRN
Start: 2016-04-22 — End: 2016-04-23

## 2016-04-22 MED ORDER — DEXTROSE IN LACTATED RINGERS 5 % IV SOLN
INTRAVENOUS | Status: DC
  Administered 2016-04-22: 18:00:00 via INTRAVENOUS
  Filled 2016-04-22: qty 10

## 2016-04-22 MED ORDER — SODIUM CHLORIDE 0.9 % IV BOLUS (SEPSIS)
1000.0000 mL | Freq: Once | INTRAVENOUS | Status: AC
  Administered 2016-04-22: 1000 mL via INTRAVENOUS

## 2016-04-22 MED ORDER — ONDANSETRON HCL 4 MG/2ML IJ SOLN
4.0000 mg | Freq: Once | INTRAMUSCULAR | Status: AC
  Administered 2016-04-22: 4 mg via INTRAVENOUS
  Filled 2016-04-22: qty 2

## 2016-04-22 MED ORDER — PRENATAL MULTIVITAMIN CH: 1.0000 | ORAL_TABLET | Freq: Every day | ORAL | Status: DC

## 2016-04-22 MED ORDER — DEXTROSE IN LACTATED RINGERS 5 % IV SOLN
INTRAVENOUS | Status: DC
  Administered 2016-04-23 – 2016-04-24 (×5): via INTRAVENOUS

## 2016-04-22 MED ORDER — POTASSIUM CHLORIDE 10 MEQ/100ML IV SOLN
10.0000 meq | Freq: Once | INTRAVENOUS | Status: AC
  Administered 2016-04-22: 10 meq via INTRAVENOUS
  Filled 2016-04-22: qty 100

## 2016-04-22 MED ORDER — DOCUSATE SODIUM 100 MG PO CAPS: 100.0000 mg | ORAL_CAPSULE | Freq: Every day | ORAL | Status: DC

## 2016-04-22 NOTE — ED Notes (Signed)
Pt states she has been having N/V for the last three days.  She states she has thrown everything up in her stomach and is now just throwing up spit.

## 2016-04-22 NOTE — ED Provider Notes (Addendum)
[facility] Emergency Department Provider Note   ____________________________________________   First MD Initiated Contact with Patient 04/22/16 1334     (approximate)  I have reviewed the triage vital signs and the nursing notes.   HISTORY  Chief Complaint Hyperemesis Gravidarum    HPI Sierra Jordan is a 25 y.o. female with no chronic medical problems, G6P0 at approximately 11 weeks estimated gestational age, first trimester ultrasound obtained last month showed both gestational sac and yolk sac, who presents for evaluation for one week of continued nausea and vomiting, gradual onset, constant, severe, no modifying factors. Patient was seen in this emergency department on 04/18/2016 for similar symptoms, diagnosed with hyperemesis gravidarum, discharged with Reglan. She reports that she has had persistent vomiting despite Reglan. No blood in her vomit or stool. She had normal bowel movement yesterday. She is complaining of mild diffuse abdominal cramping. No fevers or chills. No chest pain or difficulty breathing. She denies any abnormal vaginal bleeding or vaginal discharge, no loss of fluid from her vagina.   History reviewed. No pertinent past medical history.  There are no active problems to display for this patient.   History reviewed. No pertinent surgical history.  Prior to Admission medications   Medication Sig Start Date End Date Taking? Authorizing Provider  Alum & Mag Hydroxide-Simeth (MAGIC MOUTHWASH W/LIDOCAINE) SOLN Take 5 mLs by mouth 4 (four) times daily. 04/01/15   Joni Reining, PA-C  amoxicillin (AMOXIL) 500 MG capsule Take 1 capsule (500 mg total) by mouth 3 (three) times daily. 04/01/15   Joni Reining, PA-C  metroNIDAZOLE (METROGEL VAGINAL) 0.75 % vaginal gel Place 1 Applicatorful vaginally at bedtime. x5 nights 03/14/16   Irean Hong, MD    Allergies Review of patient's allergies indicates no known allergies.  No family history on  file.  Social History Social History  Substance Use Topics  . Smoking status: Former Games developer  . Smokeless tobacco: Never Used  . Alcohol use No    Review of Systems Constitutional: No fever/chills Eyes: No visual changes. ENT: No sore throat. Cardiovascular: Denies chest pain. Respiratory: Denies shortness of breath. Gastrointestinal: + abdominal pain.  + nausea, + vomiting.  No diarrhea.  No constipation. Genitourinary: Negative for dysuria. Musculoskeletal: Negative for back pain. Skin: Negative for rash. Neurological: Negative for headaches, focal weakness or numbness.  10-point ROS otherwise negative.  ____________________________________________   PHYSICAL EXAM:  VITAL SIGNS: ED Triage Vitals [04/22/16 1039]  Enc Vitals Group     BP 131/69     Pulse Rate 90     Resp 18     Temp 98.6 F (37 C)     Temp Source Oral     SpO2 99 %     Weight 214 lb (97.1 kg)     Height  (1.6 m)     Head Circumference      Peak Flow      Pain Score 7     Pain Loc      Pain Edu?      Excl. in GC?     Constitutional: Alert and oriented. Nontoxic appearing but actively retching. Eyes: Conjunctivae are normal. PERRL. EOMI. Head: Atraumatic. Nose: No congestion/rhinnorhea. Mouth/Throat: Mucous membranes are moist.  Oropharynx non-erythematous. Neck: No stridor.  Supple without meningismus. Cardiovascular: Normal rate, regular rhythm. Grossly normal heart sounds.  Good peripheral circulation. Respiratory: Normal respiratory effort.  No retractions. Lungs CTAB. Gastrointestinal: Soft and nontender. No distention.  No CVA tenderness.  Genitourinary: deferred Musculoskeletal: No lower extremity tenderness nor edema.  No joint effusions. Neurologic:  Normal speech and language. No gross focal neurologic deficits are appreciated. No gait instability. Skin:  Skin is warm, dry and intact. No rash noted. Psychiatric: Mood and affect are normal. Speech and behavior are  normal.  ____________________________________________   LABS (all labs ordered are listed, but only abnormal results are displayed)  Labs Reviewed  COMPREHENSIVE METABOLIC PANEL - Abnormal; Notable for the following:       Result Value   Potassium 2.8 (*)    Chloride 100 (*)    Glucose, Bld 115 (*)    AST 48 (*)    All other components within normal limits  URINALYSIS COMPLETEWITH MICROSCOPIC ([facility] ONLY) - Abnormal; Notable for the following:    Color, Urine AMBER (*)    APPearance HAZY (*)    Bilirubin Urine 1+ (*)    Ketones, ur 2+ (*)    Protein, ur 100 (*)    Squamous Epithelial / LPF 6-30 (*)    All other components within normal limits  HCG, QUANTITATIVE, PREGNANCY - Abnormal; Notable for the following:    hCG, Beta Chain, Quant, S 90,125 (*)    All other components within normal limits  LIPASE, BLOOD  CBC   ____________________________________________  EKG  none  ____________________________________________  RADIOLOGY  Limited Emergency department bedside ultrasound performed by me shows a live intrauterine pregnancy with a fetal heart rate of 176 bpm. ____________________________________________   PROCEDURES  Procedure(s) performed: None  Procedures  Critical Care performed: No  ____________________________________________   INITIAL IMPRESSION / ASSESSMENT AND PLAN / ED COURSE  Pertinent labs & imaging results that were available during my care of the patient were reviewed by me and considered in my medical decision making (see chart for details).  Sierra Jordan is a 25 y.o. female with no chronic medical problems, G6P0 at approximately 11 weeks estimated gestational age, first trimester ultrasound obtained last month showed both gestational sac and yolk sac, who presents for evaluation for one week of continued nausea and vomiting, gradual onset, constant, severe, no modifying factors, recently seen and treated in this emergency department for  hyperemesis gravidarum. On exam, she is nontoxic but actively retching. She has a benign soft abdomen. Her vital signs are stable and she is afebrile. Suspect continued hyperemesis gravidarum, we'll obtain screening labs, treat her symptomatically, give IV fluids and reassess for disposition.  ----------------------------------------- 3:13 PM on 04/22/2016 ----------------------------------------- Potassium 2.8, will replete. Unremarkable CBC. Urinalysis with 2+ ketones, continue IV fluids. At this point, the patient spelled outpatient management and given hypokalemia, I discussed the case with Dr. Dalbert Garnet who agrees with admission. She also recommended I speak with CNM Jones who will admit the patient.     Clinical Course     ____________________________________________   FINAL CLINICAL IMPRESSION(S) / ED DIAGNOSES  Final diagnoses:  Hyperemesis gravidarum  Hypokalemia      NEW MEDICATIONS STARTED DURING THIS VISIT:  New Prescriptions   No medications on file     Note:  This document was prepared using Dragon voice recognition software and may include unintentional dictation errors.     Gayla Doss, MD 04/22/16 1516    Gayla Doss, MD 04/22/16 8185    Gayla Doss, MD 04/22/16 [phone]

## 2016-04-22 NOTE — Progress Notes (Signed)
S:Feels better now, less vomiting and nausea with the Phenergan.  O: Vitals:   04/22/16 1400 04/22/16 1530 04/22/16 1650 04/22/16 1947  BP: (!) 126/58 138/89 129/76 (!) 107/57  Pulse: 69 63 67 79  Resp:  15 16 20   Temp:   97.9 F (36.6 C) 98.9 F (37.2 C)  TempSrc:   Oral Oral  SpO2: 100% 100% 100% 100%  Weight:      Height:         Gen: NAD, AAOx3      Abd: NT      Ext: Non-tender, Nonedmeatous     A/P:  25 y.o. yo G1P0 at [redacted]w[redacted]d for Hyperemesis Gravidarum.   Viable pregnancy with + FHR in ER.   IUP at 11 1/7 weeks  Continue hydration.   Sharee Pimple 10:24 PM

## 2016-04-22 NOTE — ED Triage Notes (Signed)
Patient presents to the ED with nausea, vomiting, and lower abdominal cramping.  Patient states she was seen in the ED on Saturday and was diagnosed with hyperemesis gravidarum and instructed to follow-up with her OB-GYN.  Patient tearful in triage stating that she lost her insurance and does not have an OB-Gyn.  Patient states vomiting has continued and lower abdominal cramping began this morning.  Patient states, "I literally can't keep anything down."  Patient states this is her 6th pregnancy and she has no children.

## 2016-04-22 NOTE — H&P (Addendum)
HISTORY AND PHYSICAL  HISTORY OF PRESENT ILLNESS: Sierra Jordan is a 25 y.o. G6P0 (3 Spont AB & 2 TAB) at [redacted]w[redacted]d by LMP 02/04/15  consistent with 6 week ultrasound with a pregnancy complicated by chills, vomiting, abdominal discomfort presenting for evaluation. Pt also was seen on 04/18/16 for hyperemesis gravidarum and was discharged at home on Reglan. Pt does not feel the Reglan is working for her and she wants to try the Zofran as they did in the ER.   She has no been having contractions, mild generalized cramping.    REVIEW OF SYSTEMS: A complete review of systems was performed and was specifically negative for headache, changes in vision, RUQ pain, shortness of breath, chest pain, lower extremity edema and dysuria. +Nausea, +vomiting, no reflux.   HISTORY:  History reviewed. No pertinent past medical history.  History reviewed. No pertinent surgical history.  No current facility-administered medications on file prior to encounter.    No current outpatient prescriptions on file prior to encounter.     No Known Allergies  G6P0  Gynecologic History: Recent BV tx with Metrogel History of Abnormal Pap Smear:  History of STI:   Social History  Substance Use Topics  . Smoking status: Former Games developer  . Smokeless tobacco: Never Used  . Alcohol use No    PHYSICAL EXAM: Temp:  [97.7 F (36.5 C)-98.9 F (37.2 C)] 98.9 F (37.2 C) (07/27 1947) Pulse Rate:  [63-90] 79 (07/27 1947) Resp:  [15-20] 20 (07/27 1947) BP: (107-138)/(57-89) 107/57 (07/27 1947) SpO2:  [99 %-100 %] 100 % (07/27 1947) Weight:  [214 lb (97.1 kg)] 214 lb (97.1 kg) (07/27 1039)  GENERAL: NAD AAOx3 CHEST:CTAB no increased work of breathing. NO W/R/R. CV:RRR no appreciable murmurs, rubs, gallops, No M/R/G. ABDOMEN: flat, +aud BS, no appreciable tendernness  EXTREMITIES:  Warm and well-perfused, nontender, nonedematous, 1+ DTRs0clonus CERVIX:not evaluated   DIAGNOSTIC STUDIES:  Recent Labs Lab 04/17/16 2202  04/22/16 1045  WBC 9.4 9.0  HGB 12.6 13.1  HCT 35.2 36.5  PLT 285 312  NA 136 135  K 3.4* 2.8*  CL 106 100*  CO2 20* 24  BUN 15 10  CREATININE 0.38* 0.49  GLUCOSE 113* 115*  CALCIUM 9.6 9.3  ALKPHOS 45 45  AST 20 48*  ALT 17 51  PROT 8.0 8.1    PRENATAL STUDIES:  Prenatal Labs:  Not drawn yet  Last [place] 11  wks in the ER, has +FHR of 176.   ASSESSMENT AND PLAN:  1. Fetal Well being  - Fetal Heart Rate: 176 BPM on [place] in ER - Ultrasound:  reviewed, as above: singleton IUP at 11 weeks   2. Hypoemesis Gravidarum - Hydration with IV D5LR with MVI at 125 ml/hr - Sips as tol Reglan ordered Phenergan ordered Pt may demand Zofran as she wants it if Phenergan does not hold the N&V (Explained that the FDA warns that there is increased risk for cardiac and facial defects) Pt accepts the risks and may want if the other drugs do not work  3. Hypokalemia ((2.8) Potassium replacement per orders  4. BHCG in 48 hours

## 2016-04-23 MED ORDER — SODIUM CHLORIDE 0.9 % IJ SOLN
INTRAMUSCULAR | Status: AC
  Filled 2016-04-23: qty 10

## 2016-04-23 MED ORDER — DIPHENHYDRAMINE HCL 50 MG/ML IJ SOLN
12.5000 mg | Freq: Four times a day (QID) | INTRAMUSCULAR | Status: DC
  Administered 2016-04-23 – 2016-04-24 (×6): 12.5 mg via INTRAVENOUS
  Filled 2016-04-23 (×6): qty 1

## 2016-04-23 MED ORDER — SCOPOLAMINE 1 MG/3DAYS TD PT72
1.0000 | MEDICATED_PATCH | TRANSDERMAL | Status: DC
  Administered 2016-04-23 (×2): 1.5 mg via TRANSDERMAL
  Filled 2016-04-23 (×2): qty 1

## 2016-04-23 MED ORDER — SODIUM CHLORIDE 0.9 % IV SOLN
8.0000 mg | Freq: Three times a day (TID) | INTRAVENOUS | Status: DC
  Administered 2016-04-23 – 2016-04-24 (×3): 8 mg via INTRAVENOUS
  Filled 2016-04-23 (×7): qty 4

## 2016-04-23 MED ORDER — PROMETHAZINE HCL 25 MG/ML IJ SOLN
25.0000 mg | Freq: Four times a day (QID) | INTRAMUSCULAR | Status: DC
Start: 2016-04-23 — End: 2016-04-24
  Administered 2016-04-23 – 2016-04-24 (×3): 25 mg via INTRAVENOUS
  Filled 2016-04-23 (×3): qty 1

## 2016-04-23 MED ORDER — FAMOTIDINE IN NACL 20-0.9 MG/50ML-% IV SOLN
20.0000 mg | Freq: Two times a day (BID) | INTRAVENOUS | Status: DC
  Administered 2016-04-23 – 2016-04-24 (×3): 20 mg via INTRAVENOUS
  Filled 2016-04-23 (×3): qty 50

## 2016-04-23 NOTE — Progress Notes (Signed)
Subjective: Patient reports nausea., +voiding, minimal vomiting  Objective: I have reviewed patient's vital signs, intake and output, medications and labs.  General: alert, cooperative, appears stated age and no distress Resp: clear to auscultation bilaterally Cardio: regular rate and rhythm, S1, S2 normal, no murmur, click, rub or gallop GI: soft, non-tender; bowel sounds normal; no masses,  no organomegaly   Assessment/Plan: Scheduled zofran, phenergan, iv pepcid, scopolamine and benadryl. NPO for now. Continue D5LR at 125mg /hr. Continue measuring Is/Os.   LOS: 0 days    Sierra Jordan 04/23/2016, 2:14 PM

## 2016-04-24 LAB — URINE CULTURE: Special Requests: NORMAL

## 2016-04-24 MED ORDER — PROMETHAZINE HCL 25 MG PO TABS: 25.0000 mg | ORAL_TABLET | Freq: Four times a day (QID) | ORAL | 0 refills | Status: DC | PRN

## 2016-04-24 MED ORDER — ONDANSETRON HCL 4 MG PO TABS
4.0000 mg | ORAL_TABLET | Freq: Three times a day (TID) | ORAL | Status: DC | PRN
Start: 2016-04-24 — End: 2016-04-25
  Administered 2016-04-24: 4 mg via ORAL
  Filled 2016-04-24 (×2): qty 1

## 2016-04-24 MED ORDER — PROMETHAZINE HCL 25 MG RE SUPP: 25.0000 mg | Freq: Four times a day (QID) | RECTAL | Status: DC | PRN

## 2016-04-24 MED ORDER — FAMOTIDINE 20 MG PO TABS: 20.0000 mg | ORAL_TABLET | Freq: Two times a day (BID) | ORAL | 3 refills | Status: DC

## 2016-04-24 MED ORDER — PROMETHAZINE HCL 25 MG PO TABS
25.0000 mg | ORAL_TABLET | Freq: Four times a day (QID) | ORAL | Status: DC | PRN
  Administered 2016-04-24 (×2): 25 mg via ORAL
  Filled 2016-04-24 (×2): qty 1

## 2016-04-24 MED ORDER — PROMETHAZINE HCL 25 MG RE SUPP: 25.0000 mg | Freq: Four times a day (QID) | RECTAL | 0 refills | Status: DC | PRN

## 2016-04-24 MED ORDER — ONDANSETRON HCL 4 MG PO TABS: 4.0000 mg | ORAL_TABLET | Freq: Three times a day (TID) | ORAL | 0 refills | Status: DC | PRN

## 2016-04-24 MED ORDER — PRENATAL MULTIVITAMIN CH: 1.0000 | ORAL_TABLET | Freq: Every day | ORAL | 3 refills | Status: DC

## 2016-04-24 NOTE — Discharge Planning (Signed)
Spoke with Dr. Dalbert Garnet at 2035 regarding pt desire to dc to home. Tolerating po meds and regular diet. No emesis. Awaiting dc from MD to print and will dc pt to home. Prescriptions will be sent to [facility] as per pt request.

## 2016-04-24 NOTE — Progress Notes (Signed)
HD#2  Subjective: Patient reports nausea.  No problems voiding.  Objective: I have reviewed patient's vital signs, intake and output, medications and labs.  General: alert, cooperative and appears stated age GI: soft, non-tender; bowel sounds normal; no masses,  no organomegaly Extremities: extremities normal, atraumatic, no cyanosis or edema  Assessment: Hyperemesis improved with medications, including scopolamine patch, benadryl, Pepcid, phenergan, zofran  Plan: Advance diet Advance to PO medication Clear liquids  See how she tolerates clear diet and advance as tolerated If tolerating po tonight, will d/c home with above scripts. If not, will d/c when clinically appropriate.  Christeen Douglas 04/24/2016, 12:03 PM

## 2016-04-24 NOTE — Discharge Summary (Signed)
IV dc'd. Dc reviewed and signed. Prescription given for PNV. Phenergan and Zofran have been sent to pharmacy. Pt dc'd to home at 2130.

## 2016-04-24 NOTE — Discharge Summary (Signed)
Physician Discharge Summary  Patient ID: Sierra Jordan MRN: [id number] DOB/AGE: 22-May-1991 25 y.o.  Admit date: 04/22/2016 Discharge date: 04/24/2016  Admission Diagnoses: Hyperemesis gravidarum requiring IV antiemetics Ketones in urine Dehydration requiring IV hydration Hypokalemia with repletion inhouse Hypocloridemia  Discharge Diagnoses:  Active Problems:   Hyperemesis affecting pregnancy, antepartum   Discharged Condition: stable  Hospital Course: Pt is 25yo G6P0050 at [redacted]w[redacted]d andwas admitted with hyperemesis including the above dx. She was made NPO, started on antiemetics and IVF, and monitored. FHT were monitored and found daily in the 150s. After 36hours, her nausea and vomiting were not significantly improved, and zofran was added to her regimen after discussion of the slight risk to her fetus. She improved significantly with pepcid, scopolamine patch, phenergen and Zofran. Her diet was cautiously advanced until she was tolerating a liquid diet with occasional solids. She was discharged home in stable condition.  Consults: None  Significant Diagnostic Studies: No components found for: CBC, CMP    Treatments: IV hydration and meds  Discharge Exam: Blood pressure 120/72, pulse 75, temperature 98.6 F (37 C), temperature source Oral, resp. rate 18, height 5\' 3"  (1.6 m), weight 214 lb (97.1 kg), last menstrual period 02/04/2016, SpO2 100 %. General appearance: alert, cooperative, appears stated age and no distress  Disposition: 01-Home or Self Care     Medication List    TAKE these medications   famotidine 20 MG tablet Commonly known as:  PEPCID Take 1 tablet (20 mg total) by mouth 2 (two) times daily.   ondansetron 4 MG tablet Commonly known as:  ZOFRAN Take 1 tablet (4 mg total) by mouth every 8 (eight) hours as needed for nausea, vomiting or refractory nausea / vomiting.   prenatal multivitamin Tabs tablet Take 1 tablet by mouth daily.   promethazine 25 MG  tablet Commonly known as:  PHENERGAN Take 1 tablet (25 mg total) by mouth every 6 (six) hours as needed for nausea.   promethazine 25 MG suppository Commonly known as:  PHENERGAN Place 1 suppository (25 mg total) rectally every 6 (six) hours as needed for nausea or refractory nausea / vomiting.        SignedChristeen Douglas 04/24/2016, 9:38 PM

## 2016-05-26 LAB — OB RESULTS CONSOLE HEPATITIS B SURFACE ANTIGEN: Hepatitis B Surface Ag: NEGATIVE

## 2016-05-26 LAB — OB RESULTS CONSOLE RUBELLA ANTIBODY, IGM: Rubella: IMMUNE

## 2016-05-26 LAB — OB RESULTS CONSOLE RPR: RPR: NONREACTIVE

## 2016-05-26 LAB — OB RESULTS CONSOLE HIV ANTIBODY (ROUTINE TESTING): HIV: NONREACTIVE

## 2016-05-26 LAB — OB RESULTS CONSOLE VARICELLA ZOSTER ANTIBODY, IGG: Varicella: IMMUNE

## 2016-09-27 NOTE — L&D Delivery Note (Signed)
Delivery Note Primary OB: [facility] Delivery Physician: Annamarie MajorPaul Mansel Strother, MD Gestational Age: Full term Antepartum complications: gestational diabetes and pregnancy-induced hypertension Intrapartum complications: None  A viable Female was delivered via vertex perentation.  Apgars:8 ,9  Weight:  6 lb 3 oz .   Placenta status: manual removal and Intact.  Cord: 3+ vessels;  with the following complications: none.  Anesthesia:  epidural Episiotomy:  none Lacerations:  periuretheral Suture Repair: 2.0 vicryl Est. Blood Loss (mL):  300 mL  Mom to postpartum.  Baby to Couplet care / Skin to Skin.  Annamarie MajorPaul Pia Jedlicka, MD Dept of OB/GYN [phone]

## 2016-10-05 LAB — OB RESULTS CONSOLE GBS: GBS: NEGATIVE

## 2016-10-11 ENCOUNTER — Encounter: Payer: Self-pay | Admitting: Dietician

## 2016-10-11 ENCOUNTER — Encounter: Payer: Medicaid Other | Attending: Obstetrics & Gynecology | Admitting: Dietician

## 2016-10-11 VITALS — BP 134/94 | Ht 63.0 in | Wt 242.9 lb

## 2016-10-11 DIAGNOSIS — Z713 Dietary counseling and surveillance: Secondary | ICD-10-CM | POA: Insufficient documentation

## 2016-10-11 DIAGNOSIS — O24419 Gestational diabetes mellitus in pregnancy, unspecified control: Secondary | ICD-10-CM | POA: Insufficient documentation

## 2016-10-11 DIAGNOSIS — O24410 Gestational diabetes mellitus in pregnancy, diet controlled: Secondary | ICD-10-CM

## 2016-10-11 NOTE — Progress Notes (Addendum)
Diabetes Self-Management Education  Visit Type: First/Initial  Appt. Start Time:1330 Appt. End Time:1500  10/11/2016  [name], identified by name and date of birth, is a 26 y.o. female with a diagnosis of Diabetes: Gestational Diabetes.   ASSESSMENT  Blood pressure (!) 134/94, height 5\' 3"  (1.6 m), weight 242 lb 14.4 oz (110.2 kg), last menstrual period 02/04/2016. Body mass index is 43.03 kg/m. Lacks knowledge of diabetes care Does not have a BG meter     Diabetes Self-Management Education - 10/11/16 1606      Visit Information   Visit Type First/Initial     Initial Visit   Diabetes Type Gestational Diabetes     Health Coping   How would you rate your overall health? Good     Psychosocial Assessment   Patient Belief/Attitude about Diabetes Motivated to manage diabetes   Self-care barriers None   Patient Concerns Weight Control;Glycemic Control  become more fit   Special Needs None   Preferred Learning Style Visual;Hands on   Learning Readiness Ready   What is the last grade level you completed in school? 12+     Pre-Education Assessment   Patient understands the diabetes disease and treatment process. Needs Instruction   Patient understands incorporating nutritional management into lifestyle. Needs Instruction   Patient undertands incorporating physical activity into lifestyle. Needs Instruction   Patient understands using medications safely. Needs Instruction   Patient understands monitoring blood glucose, interpreting and using results Needs Instruction   Patient understands prevention, detection, and treatment of acute complications. Needs Instruction   Patient understands prevention, detection, and treatment of chronic complications. Needs Instruction   Patient understands how to develop strategies to address psychosocial issues. Needs Instruction   Patient understands how to develop strategies to promote health/change behavior. Needs Instruction     Complications   How often do you check your blood sugar? 0 times/day (not testing)   Have you had a dilated eye exam in the past 12 months? No  04-2014   Have you had a dental exam in the past 12 months? Yes  11-2015   Are you checking your feet? Yes  reports some swelling both feet   How many days per week are you checking your feet? 7     Dietary Intake   Breakfast --  eats breakfast at 8am   Snack (morning) --  eats chips at 10a   Lunch --  eats lunch at 12p-eats out 5x/wk.-eats fried foods daily   Snack (afternoon) --  eats chips at 2p and 6p   Dinner --  eats late supper at 8p-eats sweets 2-3x/wk   Snack (evening) --  none   Beverage(s) --  drinks fruit juice 4x/day, sweet tea 2-3x/day,milk 1x/day and water 4-5x/day     Exercise   Exercise Type Light (walking / raking leaves);ADL's   How many days per week to you exercise? 7   How many minutes per day do you exercise? 30   Total minutes per week of exercise 210     Patient Education   Previous Diabetes Education Yes (please comment)  some education when both parents had diabetes education   Disease state  Factors that contribute to the development of diabetes;Explored patient's options for treatment of their diabetes;Definition of diabetes, type 1 and 2, and the diagnosis of diabetes   Nutrition management  Role of diet in the treatment of diabetes and the relationship between the three main macronutrients and blood glucose level;Food label reading, portion  sizes and measuring food.;Carbohydrate counting   Physical activity and exercise  Helped patient identify appropriate exercises in relation to his/her diabetes, diabetes complications and other health issue.   Medications --  discussed medications used for GDM   Monitoring Taught/evaluated SMBG meter.;Taught/discussed recording of test results and interpretation of SMBG.;Purpose and frequency of SMBG.  gave pt Accuchek Guide meter and instructed on its use-BG 99 (2 hr  pp)   Acute complications --  reviewed symptoms of high and low BG   Psychosocial adjustment Role of stress on diabetes   Preconception care Pregnancy and GDM  Role of pre-pregnancy blood glucose control on the development of the fetus;Reviewed with patient blood glucose goals with pregnancy;Role of family planning for patients with diabetes   Personal strategies to promote health Lifestyle issues that need to be addressed for better diabetes care;Helped patient develop diabetes management plan for (enter comment)      Individualized Plan for Diabetes Self-Management Training:   Learning Objective:  Patient will have a greater understanding of diabetes self-management. Patient education plan is to attend individual and/or group sessions per assessed needs and concerns.   Plan:   Patient Instructions  Read booklet on Gestational Diabetes Follow Gestational Meal Planning Guidelines Complete a 3 Day Food Record and bring to next appointment Check blood sugars 4 x day - before breakfast and 2 hrs after every meal and record  Bring blood sugar log to all appointments Call MD for prescription for meter strips and lancets Strips:  Accuchek Guide test strips Lancets:   Accuchek Fast Clix lancets Walk 20-30 minutes at least 5 x week if permitted by MD Next appointment    10-22-16   Expected Outcomes:   positive  Education material provided: General Meal Planning Guidelines, GDM booklet and GDM video, Accuchek Guide meter   If problems or questions, patient to contact team via:  [phone]  Future DSME appointment:   10-22-16

## 2016-10-11 NOTE — Patient Instructions (Signed)
Read booklet on Gestational Diabetes Follow Gestational Meal Planning Guidelines Complete a 3 Day Food Record and bring to next appointment Check blood sugars 4 x day - before breakfast and 2 hrs after every meal and record  Bring blood sugar log to all appointments Call MD for prescription for meter strips and lancets Strips:  Accuchek Guide test strips Lancets:   Accuchek Fast Clix lancets Walk 20-30 minutes at least 5 x week if permitted by MD Next appointment    10-22-16

## 2016-10-22 ENCOUNTER — Ambulatory Visit: Payer: Medicaid Other | Admitting: Dietician

## 2016-10-29 ENCOUNTER — Encounter: Payer: Medicaid Other | Attending: Obstetrics & Gynecology | Admitting: Dietician

## 2016-10-29 ENCOUNTER — Encounter: Payer: Self-pay | Admitting: Dietician

## 2016-10-29 VITALS — Ht 63.0 in | Wt 241.6 lb

## 2016-10-29 DIAGNOSIS — O24419 Gestational diabetes mellitus in pregnancy, unspecified control: Secondary | ICD-10-CM | POA: Diagnosis present

## 2016-10-29 DIAGNOSIS — Z713 Dietary counseling and surveillance: Secondary | ICD-10-CM | POA: Diagnosis present

## 2016-10-29 DIAGNOSIS — O24410 Gestational diabetes mellitus in pregnancy, diet controlled: Secondary | ICD-10-CM

## 2016-10-29 NOTE — Patient Instructions (Signed)
   Keep starch portions to 1 cup (fist-size) or less with meals  Include snacks during the day to better control hunger at supper.   Limit juice to 4oz and include some protein when you drink juice. Try Healthy Balance (low sugar) juice which you can have in larger portions.   Keep up your great vegetable intake, and your exercise, good job!

## 2016-10-29 NOTE — Progress Notes (Signed)
   Patient did not have BG record; verbal recall indicates BGs are in goal range fasting, but elevated in the evenings. She reports an average of 190mg /dl after supper. She has been using her parents' supplies, as she has not been able to obtain her own supply of strips yet.   Patient did not bring a food diary, but verbal recall indicates likely some large portions of starches, and some fruit juices, which patient feels are reasons for elevated BGs in the evenings.    Provided 1800kcal meal plan, and wrote individualized menus based on patient's food preferences. Advised patient to include snacks during the day to better control hunger at supper. Also advised increasing portions of protein foods and/or vegetables if needed to satisfy hunger. Discussed options for further limiting intake of fruit juices.   Instructed patient on food safety, including avoidance of Listeriosis, and limiting mercury from fish.  Discussed importance of maintaining healthy lifestyle habits to reduce risk of Type 2 DM as well as Gestational DM with any future pregnancies.  Advised patient to use any remaining testing supplies to test some BGs after delivery, and to have BG tested ideally annually, as well as prior to attempting future pregnancies.

## 2016-11-01 ENCOUNTER — Inpatient Hospital Stay
Admission: EM | Admit: 2016-11-01 | Discharge: 2016-11-01 | Disposition: A | Discharge status: Home / Self Care | Payer: Medicaid Other | Source: Home / Self Care | Admitting: Obstetrics and Gynecology

## 2016-11-01 ENCOUNTER — Encounter: Payer: Self-pay | Admitting: *Deleted

## 2016-11-01 DIAGNOSIS — O4292 Full-term premature rupture of membranes, unspecified as to length of time between rupture and onset of labor: Secondary | ICD-10-CM | POA: Insufficient documentation

## 2016-11-01 DIAGNOSIS — Z3A38 38 weeks gestation of pregnancy: Secondary | ICD-10-CM | POA: Insufficient documentation

## 2016-11-01 DIAGNOSIS — Z87891 Personal history of nicotine dependence: Secondary | ICD-10-CM | POA: Insufficient documentation

## 2016-11-01 NOTE — Discharge Summary (Signed)
Physician Final Progress Note  Patient ID: Sierra Jordan MRN: [id number] DOB/AGE: 27/04/1991 25 y.o.  Admit date: 11/01/2016 Admitting provider: Tresea MallJane Rashaad Hallstrom, CNM Discharge date: 11/01/2016   Admission Diagnoses: G6 P0050 at 2252w5d with c/o fluid leaking. Pt admits positive fetal movement. She denies contractions, vaginal bleeding.   Discharge Diagnoses:  Active Problems:   Labor and delivery indication for care or intervention IUP at 3852w5d with reactive NST. Membranes intact. Not in labor.  History of Present Illness: Patient was admitted for observation, placed on monitors, tested for rupture of membranes.  History reviewed. No pertinent past medical history.  History reviewed. No pertinent surgical history.  No current facility-administered medications on file prior to encounter.    Current Outpatient Prescriptions on File Prior to Encounter  Medication Sig Dispense Refill  . acetaminophen (TYLENOL) 325 MG tablet Take 325 mg by mouth every 4 (four) hours as needed.    . Prenatal Vit-Fe Fumarate-FA (PRENATAL MULTIVITAMIN) TABS tablet Take 1 tablet by mouth daily. 60 tablet 3  . promethazine (PHENERGAN) 25 MG tablet Take 1 tablet (25 mg total) by mouth every 6 (six) hours as needed for nausea. 30 tablet 0    No Known Allergies  Social History   Social History  . Marital status: Single    Spouse name: N/A  . Number of children: N/A  . Years of education: N/A   Occupational History  . Not on file.   Social History Main Topics  . Smoking status: Former Games developermoker  . Smokeless tobacco: Never Used  . Alcohol use No  . Drug use: No  . Sexual activity: Yes   Other Topics Concern  . Not on file   Social History Narrative  . No narrative on file    Physical Exam: BP 132/84   Pulse (!) 101   LMP 02/04/2016 Comment: pt is pregnant; [id number]  Gen: NAD CV: RRR Pulm: CTAB Pelvic: deferred Toco: acontractile Fetal Well Being: 135 bpm, moderate variability, +accelerations,  -decelerations Membranes: negative nitrazine Ext: no evidence of DVT  Consults: None  Significant Findings/ Diagnostic Studies: none  Procedures: NST  Discharge Condition: good  Disposition: 01-Home or Self Care  Diet: Regular diet  Discharge Activity: Activity as tolerated  Discharge Instructions    Discharge activity:  No Restrictions    Complete by:  As directed    Discharge diet:  No restrictions    Complete by:  As directed    Fetal Kick Count:  Lie on our left side for one hour after a meal, and count the number of times your baby kicks.  If it is less than 5 times, get up, move around and drink some juice.  Repeat the test 30 minutes later.  If it is still less than 5 kicks in an hour, notify your doctor.    Complete by:  As directed    LABOR:  When conractions begin, you should start to time them from the beginning of one contraction to the beginning  of the next.  When contractions are 5 - 10 minutes apart or less and have been regular for at least an hour, you should call your health care provider.    Complete by:  As directed    No sexual activity restrictions    Complete by:  As directed    Notify physician for bleeding from the vagina    Complete by:  As directed    Notify physician for blurring of vision or spots before the eyes  Complete by:  As directed    Notify physician for chills or fever    Complete by:  As directed    Notify physician for fainting spells, "black outs" or loss of consciousness    Complete by:  As directed    Notify physician for increase in vaginal discharge    Complete by:  As directed    Notify physician for leaking of fluid    Complete by:  As directed    Notify physician for pain or burning when urinating    Complete by:  As directed    Notify physician for pelvic pressure (sudden increase)    Complete by:  As directed    Notify physician for severe or continued nausea or vomiting    Complete by:  As directed    Notify  physician for sudden gushing of fluid from the vagina (with or without continued leaking)    Complete by:  As directed    Notify physician for sudden, constant, or occasional abdominal pain    Complete by:  As directed    Notify physician if baby moving less than usual    Complete by:  As directed      Allergies as of 11/01/2016   No Known Allergies     Medication List    TAKE these medications   acetaminophen 325 MG tablet Commonly known as:  TYLENOL Take 325 mg by mouth every 4 (four) hours as needed.   prenatal multivitamin Tabs tablet Take 1 tablet by mouth daily.   promethazine 25 MG tablet Commonly known as:  PHENERGAN Take 1 tablet (25 mg total) by mouth every 6 (six) hours as needed for nausea.      Follow-up Information    [facility] Follow up.   Why:  go to regular scheduled prenatal appointment Contact information: [address] [place] [address] [phone]          Total time spent taking care of this patient: 15 minutes  Signed: Tresea Mall, CNM  11/01/2016, 8:55 AM

## 2016-11-01 NOTE — OB Triage Note (Signed)
Pt. Instructed to come to unit for possible SROM [facility]  Pt. States sudden gush of fluid at 0600 this morning. Positive for fetal movement, denies vaginal bleeding. Nitrazine neg. EFM applied FHR 140s, U/S applied - abd. Soft. Pt. Denies feeling any contractions.  Pain 0/10.

## 2016-11-01 NOTE — Progress Notes (Signed)
Discharge instructions reviewed with patient, patient verbalized understanding.  Labor precautions also discussed.  Copies signed, copy given.  Pt. Encouraged to continue with scheduled OB appointments.

## 2016-11-02 ENCOUNTER — Inpatient Hospital Stay: Payer: Medicaid Other | Admitting: Anesthesiology

## 2016-11-02 ENCOUNTER — Encounter: Payer: Self-pay | Admitting: Obstetrics and Gynecology

## 2016-11-02 ENCOUNTER — Inpatient Hospital Stay
Admission: EM | Admit: 2016-11-02 | Discharge: 2016-11-05 | DRG: 767 | Disposition: A | Discharge status: Home / Self Care | Payer: Medicaid Other | Attending: Obstetrics & Gynecology | Admitting: Obstetrics & Gynecology

## 2016-11-02 DIAGNOSIS — O99324 Drug use complicating childbirth: Secondary | ICD-10-CM | POA: Diagnosis present

## 2016-11-02 DIAGNOSIS — K219 Gastro-esophageal reflux disease without esophagitis: Secondary | ICD-10-CM | POA: Diagnosis present

## 2016-11-02 DIAGNOSIS — O24429 Gestational diabetes mellitus in childbirth, unspecified control: Secondary | ICD-10-CM | POA: Diagnosis present

## 2016-11-02 DIAGNOSIS — O99214 Obesity complicating childbirth: Secondary | ICD-10-CM | POA: Diagnosis present

## 2016-11-02 DIAGNOSIS — O0990 Supervision of high risk pregnancy, unspecified, unspecified trimester: Secondary | ICD-10-CM

## 2016-11-02 DIAGNOSIS — O134 Gestational [pregnancy-induced] hypertension without significant proteinuria, complicating childbirth: Principal | ICD-10-CM | POA: Diagnosis present

## 2016-11-02 DIAGNOSIS — O139 Gestational [pregnancy-induced] hypertension without significant proteinuria, unspecified trimester: Secondary | ICD-10-CM

## 2016-11-02 DIAGNOSIS — O0993 Supervision of high risk pregnancy, unspecified, third trimester: Secondary | ICD-10-CM

## 2016-11-02 DIAGNOSIS — O9081 Anemia of the puerperium: Secondary | ICD-10-CM | POA: Diagnosis not present

## 2016-11-02 DIAGNOSIS — O133 Gestational [pregnancy-induced] hypertension without significant proteinuria, third trimester: Secondary | ICD-10-CM | POA: Diagnosis present

## 2016-11-02 DIAGNOSIS — O24419 Gestational diabetes mellitus in pregnancy, unspecified control: Secondary | ICD-10-CM | POA: Diagnosis present

## 2016-11-02 DIAGNOSIS — F1290 Cannabis use, unspecified, uncomplicated: Secondary | ICD-10-CM | POA: Diagnosis present

## 2016-11-02 DIAGNOSIS — O9962 Diseases of the digestive system complicating childbirth: Secondary | ICD-10-CM | POA: Diagnosis present

## 2016-11-02 DIAGNOSIS — Z6837 Body mass index (BMI) 37.0-37.9, adult: Secondary | ICD-10-CM

## 2016-11-02 DIAGNOSIS — Z87891 Personal history of nicotine dependence: Secondary | ICD-10-CM

## 2016-11-02 DIAGNOSIS — Z6841 Body Mass Index (BMI) 40.0 and over, adult: Secondary | ICD-10-CM | POA: Diagnosis not present

## 2016-11-02 DIAGNOSIS — O0991 Supervision of high risk pregnancy, unspecified, first trimester: Secondary | ICD-10-CM

## 2016-11-02 DIAGNOSIS — Z713 Dietary counseling and surveillance: Secondary | ICD-10-CM | POA: Diagnosis present

## 2016-11-02 DIAGNOSIS — O99211 Obesity complicating pregnancy, first trimester: Secondary | ICD-10-CM | POA: Diagnosis present

## 2016-11-02 DIAGNOSIS — Z3A38 38 weeks gestation of pregnancy: Secondary | ICD-10-CM | POA: Diagnosis not present

## 2016-11-02 DIAGNOSIS — D649 Anemia, unspecified: Secondary | ICD-10-CM | POA: Diagnosis not present

## 2016-11-02 DIAGNOSIS — O99213 Obesity complicating pregnancy, third trimester: Secondary | ICD-10-CM | POA: Diagnosis present

## 2016-11-02 HISTORY — DX: Other specified health status: Z78.9

## 2016-11-02 HISTORY — DX: Gestational (pregnancy-induced) hypertension without significant proteinuria, unspecified trimester: O13.9

## 2016-11-02 LAB — URINE DRUG SCREEN, QUALITATIVE (ARMC ONLY)
Amphetamines, Ur Screen: NOT DETECTED
Barbiturates, Ur Screen: NOT DETECTED
Benzodiazepine, Ur Scrn: NOT DETECTED
Cannabinoid 50 Ng, Ur ~~LOC~~: POSITIVE — AB
Cocaine Metabolite,Ur ~~LOC~~: NOT DETECTED
MDMA (Ecstasy)Ur Screen: NOT DETECTED
Methadone Scn, Ur: NOT DETECTED
Opiate, Ur Screen: NOT DETECTED
Phencyclidine (PCP) Ur S: NOT DETECTED
Tricyclic, Ur Screen: NOT DETECTED

## 2016-11-02 LAB — COMPREHENSIVE METABOLIC PANEL
ALT: 12 U/L — ABNORMAL LOW (ref 14–54)
AST: 19 U/L (ref 15–41)
Albumin: 2.8 g/dL — ABNORMAL LOW (ref 3.5–5.0)
Alkaline Phosphatase: 181 U/L — ABNORMAL HIGH (ref 38–126)
Anion gap: 7 (ref 5–15)
BUN: 9 mg/dL (ref 6–20)
CO2: 23 mmol/L (ref 22–32)
Calcium: 8.5 mg/dL — ABNORMAL LOW (ref 8.9–10.3)
Chloride: 104 mmol/L (ref 101–111)
Creatinine, Ser: 0.48 mg/dL (ref 0.44–1.00)
GFR calc Af Amer: >60 mL/min (ref >60)
GFR calc non Af Amer: >60 mL/min (ref >60)
Glucose, Bld: 98 mg/dL (ref 65–99)
Potassium: 3.6 mmol/L (ref 3.5–5.1)
Sodium: 134 mmol/L — ABNORMAL LOW (ref 135–145)
Total Bilirubin: 0.4 mg/dL (ref 0.3–1.2)
Total Protein: 6.9 g/dL (ref 6.5–8.1)

## 2016-11-02 LAB — CBC
HCT: 29.5 % — ABNORMAL LOW (ref 35.0–47.0)
Hemoglobin: 10.2 g/dL — ABNORMAL LOW (ref 12.0–16.0)
MCH: 28.1 pg (ref 26.0–34.0)
MCHC: 34.7 g/dL (ref 32.0–36.0)
MCV: 80.9 fL (ref 80.0–100.0)
Platelets: 360 10*3/uL (ref 150–440)
RBC: 3.64 MIL/uL — ABNORMAL LOW (ref 3.80–5.20)
RDW: 12.8 % (ref 11.5–14.5)
WBC: 8.9 10*3/uL (ref 3.6–11.0)

## 2016-11-02 LAB — PROTEIN / CREATININE RATIO, URINE
Creatinine, Urine: 86 mg/dL
Protein Creatinine Ratio: 0.21 mg/mg{Cre} — ABNORMAL HIGH (ref 0.00–0.15)
Total Protein, Urine: 18 mg/dL

## 2016-11-02 LAB — GLUCOSE, CAPILLARY
Glucose-Capillary: 93 mg/dL (ref 65–99)
Glucose-Capillary: 68 mg/dL (ref 65–99)

## 2016-11-02 MED ORDER — OXYTOCIN 40 UNITS IN LACTATED RINGERS INFUSION - SIMPLE MED
1.0000 m[IU]/min | INTRAVENOUS | Status: DC
  Administered 2016-11-03: 1 m[IU]/min via INTRAVENOUS

## 2016-11-02 MED ORDER — SODIUM CHLORIDE 0.9% FLUSH: 3.0000 mL | INTRAVENOUS | Status: DC | PRN

## 2016-11-02 MED ORDER — NALBUPHINE HCL 10 MG/ML IJ SOLN: 5.0000 mg | INTRAMUSCULAR | Status: DC | PRN

## 2016-11-02 MED ORDER — MISOPROSTOL 200 MCG PO TABS
ORAL_TABLET | ORAL | Status: AC
  Filled 2016-11-02: qty 4

## 2016-11-02 MED ORDER — LIDOCAINE HCL (PF) 1 % IJ SOLN
INTRAMUSCULAR | Status: DC | PRN
  Administered 2016-11-02: 3 mL via SUBCUTANEOUS

## 2016-11-02 MED ORDER — KETOROLAC TROMETHAMINE 30 MG/ML IJ SOLN: 30.0000 mg | Freq: Four times a day (QID) | INTRAMUSCULAR | Status: DC | PRN

## 2016-11-02 MED ORDER — LIDOCAINE HCL (PF) 1 % IJ SOLN
INTRAMUSCULAR | Status: AC
  Filled 2016-11-02: qty 30

## 2016-11-02 MED ORDER — DINOPROSTONE 10 MG VA INST
10.0000 mg | VAGINAL_INSERT | Freq: Once | VAGINAL | Status: AC
  Administered 2016-11-02: 10 mg via VAGINAL
  Filled 2016-11-02: qty 1

## 2016-11-02 MED ORDER — TERBUTALINE SULFATE 1 MG/ML IJ SOLN: 0.2500 mg | Freq: Once | INTRAMUSCULAR | Status: DC | PRN

## 2016-11-02 MED ORDER — SODIUM CHLORIDE 0.9 % IV SOLN
INTRAVENOUS | Status: DC | PRN
  Administered 2016-11-02 (×2): 5 mL via EPIDURAL

## 2016-11-02 MED ORDER — LACTATED RINGERS IV SOLN
500.0000 mL | INTRAVENOUS | Status: DC | PRN
  Administered 2016-11-02: 1000 mL via INTRAVENOUS

## 2016-11-02 MED ORDER — OXYTOCIN 10 UNIT/ML IJ SOLN
INTRAMUSCULAR | Status: AC
  Filled 2016-11-02: qty 2

## 2016-11-02 MED ORDER — AMMONIA AROMATIC IN INHA
RESPIRATORY_TRACT | Status: AC
  Filled 2016-11-02: qty 10

## 2016-11-02 MED ORDER — FENTANYL 2.5 MCG/ML W/ROPIVACAINE 0.2% IN NS 100 ML EPIDURAL INFUSION (ARMC-ANES)
10.0000 mL/h | EPIDURAL | Status: DC
  Filled 2016-11-02 (×2): qty 100

## 2016-11-02 MED ORDER — ONDANSETRON HCL 4 MG/2ML IJ SOLN: 4.0000 mg | Freq: Three times a day (TID) | INTRAMUSCULAR | Status: DC | PRN

## 2016-11-02 MED ORDER — SOD CITRATE-CITRIC ACID 500-334 MG/5ML PO SOLN: 30.0000 mL | ORAL | Status: DC | PRN

## 2016-11-02 MED ORDER — NALBUPHINE HCL 10 MG/ML IJ SOLN: 5.0000 mg | Freq: Once | INTRAMUSCULAR | Status: DC | PRN

## 2016-11-02 MED ORDER — OXYTOCIN 10 UNIT/ML IJ SOLN: 10.0000 [IU] | Freq: Once | INTRAMUSCULAR | Status: DC

## 2016-11-02 MED ORDER — DIPHENHYDRAMINE HCL 50 MG/ML IJ SOLN: 12.5000 mg | INTRAMUSCULAR | Status: DC | PRN

## 2016-11-02 MED ORDER — LIDOCAINE HCL (PF) 1 % IJ SOLN: 30.0000 mL | INTRAMUSCULAR | Status: DC | PRN

## 2016-11-02 MED ORDER — DIPHENHYDRAMINE HCL 25 MG PO CAPS: 25.0000 mg | ORAL_CAPSULE | ORAL | Status: DC | PRN

## 2016-11-02 MED ORDER — LIDOCAINE-EPINEPHRINE (PF) 1.5 %-1:200000 IJ SOLN
INTRAMUSCULAR | Status: DC | PRN
  Administered 2016-11-02: 3 mL via EPIDURAL

## 2016-11-02 MED ORDER — ONDANSETRON HCL 4 MG/2ML IJ SOLN
4.0000 mg | Freq: Four times a day (QID) | INTRAMUSCULAR | Status: DC | PRN
  Administered 2016-11-02 – 2016-11-03 (×3): 4 mg via INTRAVENOUS
  Filled 2016-11-02 (×3): qty 2

## 2016-11-02 MED ORDER — NALOXONE HCL 2 MG/2ML IJ SOSY
1.0000 ug/kg/h | PREFILLED_SYRINGE | INTRAVENOUS | Status: DC | PRN
  Filled 2016-11-02: qty 2

## 2016-11-02 MED ORDER — OXYTOCIN BOLUS FROM INFUSION
500.0000 mL | Freq: Once | INTRAVENOUS | Status: AC
  Administered 2016-11-03: 500 mL via INTRAVENOUS

## 2016-11-02 MED ORDER — NALOXONE HCL 0.4 MG/ML IJ SOLN: 0.4000 mg | INTRAMUSCULAR | Status: DC | PRN

## 2016-11-02 MED ORDER — LACTATED RINGERS IV SOLN
INTRAVENOUS | Status: DC
  Administered 2016-11-02 – 2016-11-03 (×3): via INTRAVENOUS

## 2016-11-02 MED ORDER — OXYTOCIN 40 UNITS IN LACTATED RINGERS INFUSION - SIMPLE MED
2.5000 [IU]/h | INTRAVENOUS | Status: DC
  Administered 2016-11-03: 2.5 [IU]/h via INTRAVENOUS
  Filled 2016-11-02 (×2): qty 1000

## 2016-11-02 MED ORDER — MEPERIDINE HCL 25 MG/ML IJ SOLN: 6.2500 mg | INTRAMUSCULAR | Status: DC | PRN

## 2016-11-02 MED ORDER — FENTANYL 2.5 MCG/ML W/ROPIVACAINE 0.2% IN NS 100 ML EPIDURAL INFUSION (ARMC-ANES)
EPIDURAL | Status: DC | PRN
  Administered 2016-11-02: 10 mL/h via EPIDURAL

## 2016-11-02 NOTE — H&P (Addendum)
OB History & Physical   History of Present Illness:  Chief Complaint: contractions  HPI:  ZEA KOSTKA is a 26 y.o. G72P0050 female at [redacted]w[redacted]d dated by LMP consistent with 17 week ultrasound.  Her pregnancy has been complicated by obesity (initial BMI 37, gestational diabetes (uncontrolled and not on medication), late entry into prenatal care, marijuana use.    She reports contractions about every 15 minutes.   She denies leakage of fluid.   She report vaginal bleeding (spotting).   She reports fetal movement.   She denies HAs, visual changes, and RUQ pain.   She does check her blood glucose levels. She reports that they are normal fasting but have ranged from 130s-170s after meals.   Maternal Medical History:   Past Medical History:  Diagnosis Date  . Medical history non-contributory     Past Surgical History:  Procedure Laterality Date  . DILATION AND CURETTAGE OF UTERUS  2008, 2012   D&E EAB   Allergies: No Known Allergies  Prior to Admission medications   Medication Sig Start Date End Date Taking? Authorizing Provider  Prenatal Vit-Fe Fumarate-FA (PRENATAL MULTIVITAMIN) TABS tablet Take 1 tablet by mouth daily. 04/24/16   Christeen Douglas, MD    OB History  Gravida Para Term Preterm AB Living  6 0 0 0 5 0  SAB TAB Ectopic Multiple Live Births  3 0 0 0 0    # Outcome Date GA Lbr Len/2nd Weight Sex Delivery Anes PTL Lv  6 Current           5 SAB 07/2015          4 AB 01/2015          3 SAB 06/2014          2 SAB 02/2011          1 AB 07/2007              Prenatal care site: [facility]  Social History: She  reports that she has quit smoking. She has never used smokeless tobacco. She reports that she uses drugs, including Marijuana. She reports that she does not drink alcohol.  Family History: family history includes Breast cancer in her maternal aunt; Depression in her mother; Diabetes in her father and mother; Hyperlipidemia in her mother; Hypertension in her  mother; Pancreatitis in her mother.   Review of Systems: Negative x 10 systems reviewed except as noted in the HPI.    Physical Exam:  Vital Signs: BP (!) 141/96   Pulse 92   LMP 02/04/2016 Comment: pt is pregnant; G6P0A5  BPs consistently > 140/90.   Constitutional: Well nourished, well developed female in no acute distress.  HEENT: normal Skin: Warm and dry.  Cardiovascular: Regular rate and rhythm.   Extremity: no edema Respiratory: Clear to auscultation bilateral. Normal respiratory effort Abdomen: gravid, non-tender, EFW 8 pounds Back: no CVAT Neuro: DTRs 2+, Cranial nerves grossly intact Psych: Alert and Oriented x3. No memory deficits. Normal mood and affect.  MS: normal gait, normal bilateral lower extremity ROM/strength/stability.  Pelvic exam: 1cm per RN  Pertinent Results:  Prenatal Labs: Blood type/Rh A positive  Antibody screen negative  Rubella Immune  Varicella Immune    RPR NR  HBsAg negative  HIV negative  GC negative  Chlamydia negative  Hgb [place] screen Hgb C trait  Genetic screening declined  1 hour GTT 225  3 hour GTT 106/230/201/107  GBS negative on 10/15/16   Baseline FHR: 140  beats/min   Variability: moderate   Accelerations: present   Decelerations: absent Contractions: present frequency: 1-2 q 10 min Overall assessment: category 1  Lab Results  Component Value Date   WBC 8.9 11/02/2016   HGB 10.2 (L) 11/02/2016   HCT 29.5 (L) 11/02/2016   PLT 360 11/02/2016   CREATININE 0.48 11/02/2016   PROTCRRATIO 0.21 (H) 11/02/2016     Assessment:  [name] L Hank is a 26 y.o. 226P0050 female at 315w6d with gestational hypertension, uncontrolled gestational diabetes.   Plan:  1. Admit to Labor & Delivery  2. CBC, T&S, Clrs, IVF 3. GBS negative.   4. Fetwal well-being: reassuring 5. GHTN: monitor BPs, No symptoms or lab abnormalities.  UPC ratio < 300.   6. GDMA1: monitor BG levels for need for treatment.  ~25 pound weight gain this pregnancy.  No  recent growth u/s.   7. History of marijuana use: UDS on admission.  Will advise patient regarding breastfeeding with marijuana use.  Conard NovakJackson, Maureen Delatte D, MD 11/02/2016 2:32 PM

## 2016-11-02 NOTE — Plan of Care (Signed)
Pt presents to l/d with c/o contractions q 20 min. No leaking of fluid or vaginal bleeding. dtr's 2+, no clonus, denies blurred vision or headaches. Dr Jean Rosenthaljackson notified of bp's and pt's complaint. Orders received.

## 2016-11-02 NOTE — Plan of Care (Signed)
Dr Jean Rosenthaljackson notified of cervidil being removed. No further orders received.

## 2016-11-02 NOTE — Plan of Care (Signed)
Cervidil removed due to pt stating she is having contractions , cervical change made. Pt removed toco because they are not picking up contractions and it is hard for her to breathe with them on

## 2016-11-02 NOTE — Anesthesia Preprocedure Evaluation (Signed)
Anesthesia Evaluation  Patient identified by MRN, date of birth, ID band Patient awake    Reviewed: Allergy & Precautions, H&P , NPO status , Patient's Chart, lab work & pertinent test results, reviewed documented beta blocker date and time   History of Anesthesia Complications Negative for: history of anesthetic complications  Airway Mallampati: III  TM Distance: >3 FB Neck ROM: full    Dental  (+) Teeth Intact   Pulmonary neg pulmonary ROS, former smoker,           Cardiovascular Exercise Tolerance: Good hypertension (Gestational), (-) angina(-) CAD, (-) Past MI, (-) Cardiac Stents and (-) CABG (-) dysrhythmias (-) Valvular Problems/Murmurs     Neuro/Psych negative neurological ROS  negative psych ROS   GI/Hepatic Neg liver ROS, GERD  ,  Endo/Other  diabetes (gestational)Morbid obesity  Renal/GU negative Renal ROS  negative genitourinary   Musculoskeletal   Abdominal   Peds  Hematology negative hematology ROS (+)   Anesthesia Other Findings Past Medical History: No date: Medical history non-contributory   Reproductive/Obstetrics (+) Pregnancy                             Anesthesia Physical Anesthesia Plan  ASA: III  Anesthesia Plan: Epidural   Post-op Pain Management:    Induction:   Airway Management Planned:   Additional Equipment:   Intra-op Plan:   Post-operative Plan:   Informed Consent: I have reviewed the patients History and Physical, chart, labs and discussed the procedure including the risks, benefits and alternatives for the proposed anesthesia with the patient or authorized representative who has indicated his/her understanding and acceptance.   Dental Advisory Given  Plan Discussed with: Anesthesiologist, CRNA and Surgeon  Anesthesia Plan Comments:         Anesthesia Quick Evaluation

## 2016-11-02 NOTE — Progress Notes (Signed)
Pt up on birthing ball.

## 2016-11-02 NOTE — Plan of Care (Signed)
Dr Jean Rosenthaljackson in to discuss plan of care with pt. Labs reviewed by dr Jean Rosenthaljackson.

## 2016-11-02 NOTE — Progress Notes (Signed)
[facility]

## 2016-11-02 NOTE — Anesthesia Procedure Notes (Signed)
Epidural Patient location during procedure: OB Start time: 11/02/2016 9:23 PM End time: 11/02/2016 9:37 PM  Staffing Anesthesiologist: Lenard SimmerKARENZ, Ramadan Couey Performed: anesthesiologist   Preanesthetic Checklist Completed: patient identified, site marked, surgical consent, pre-op evaluation, timeout performed, IV checked, risks and benefits discussed and monitors and equipment checked  Epidural Patient position: sitting Prep: ChloraPrep Patient monitoring: heart rate, continuous pulse ox and blood pressure Approach: midline Location: L3-L4 Injection technique: LOR saline  Needle:  Needle type: Tuohy  Needle gauge: 17 G Needle length: 9 cm and 9 Needle insertion depth: 6 cm Catheter type: closed end flexible Catheter size: 19 Gauge Catheter at skin depth: 11 cm Test dose: negative and 1.5% lidocaine with Epi 1:200 K  Assessment Sensory level: T10 Events: blood not aspirated, injection not painful, no injection resistance, negative IV test and no paresthesia  Additional Notes Pt. Evaluated and documentation done after procedure finished. Patient identified. Risks/Benefits/Options discussed with patient including but not limited to bleeding, infection, nerve damage, paralysis, failed block, incomplete pain control, headache, blood pressure changes, nausea, vomiting, reactions to medication both or allergic, itching and postpartum back pain. Confirmed with bedside nurse the patient's most recent platelet count. Confirmed with patient that they are not currently taking any anticoagulation, have any bleeding history or any family history of bleeding disorders. Patient expressed understanding and wished to proceed. All questions were answered. Sterile technique was used throughout the entire procedure. Please see nursing notes for vital signs. Test dose was given through epidural catheter and negative prior to continuing to dose epidural or start infusion. Warning signs of high block given to the  patient including shortness of breath, tingling/numbness in hands, complete motor block, or any concerning symptoms with instructions to call for help. Patient was given instructions on fall risk and not to get out of bed. All questions and concerns addressed with instructions to call with any issues or inadequate analgesia.   Patient tolerated the insertion well without immediate complications.Reason for block:procedure for pain

## 2016-11-03 ENCOUNTER — Inpatient Hospital Stay: Payer: Medicaid Other | Admitting: Anesthesiology

## 2016-11-03 ENCOUNTER — Encounter
Admission: EM | Disposition: A | Discharge status: Home / Self Care | Payer: Self-pay | Source: Home / Self Care | Attending: Obstetrics & Gynecology

## 2016-11-03 DIAGNOSIS — Z713 Dietary counseling and surveillance: Secondary | ICD-10-CM | POA: Diagnosis not present

## 2016-11-03 DIAGNOSIS — O24419 Gestational diabetes mellitus in pregnancy, unspecified control: Secondary | ICD-10-CM

## 2016-11-03 HISTORY — PX: DILATION AND CURETTAGE OF UTERUS: SHX78

## 2016-11-03 LAB — CBC
HCT: 27.1 % — ABNORMAL LOW (ref 35.0–47.0)
Hemoglobin: 9.3 g/dL — ABNORMAL LOW (ref 12.0–16.0)
MCH: 27.9 pg (ref 26.0–34.0)
MCHC: 34.3 g/dL (ref 32.0–36.0)
MCV: 81.3 fL (ref 80.0–100.0)
Platelets: 318 10*3/uL (ref 150–440)
RBC: 3.34 MIL/uL — ABNORMAL LOW (ref 3.80–5.20)
RDW: 12.7 % (ref 11.5–14.5)
WBC: 21 10*3/uL — ABNORMAL HIGH (ref 3.6–11.0)

## 2016-11-03 LAB — GLUCOSE, CAPILLARY
Glucose-Capillary: 219 mg/dL — ABNORMAL HIGH (ref 65–99)
Glucose-Capillary: 77 mg/dL (ref 65–99)
Glucose-Capillary: 82 mg/dL (ref 65–99)
Glucose-Capillary: 84 mg/dL (ref 65–99)
Glucose-Capillary: 90 mg/dL (ref 65–99)

## 2016-11-03 LAB — RPR: RPR Ser Ql: NONREACTIVE

## 2016-11-03 SURGERY — DILATION AND CURETTAGE
Anesthesia: Epidural | Site: Vagina | Wound class: Clean Contaminated

## 2016-11-03 MED ORDER — MIDAZOLAM HCL 2 MG/2ML IJ SOLN
INTRAMUSCULAR | Status: DC | PRN
  Administered 2016-11-03: 2 mg via INTRAVENOUS

## 2016-11-03 MED ORDER — OXYCODONE-ACETAMINOPHEN 5-325 MG PO TABS: 1.0000 | ORAL_TABLET | ORAL | Status: DC | PRN

## 2016-11-03 MED ORDER — MIDAZOLAM HCL 2 MG/2ML IJ SOLN
INTRAMUSCULAR | Status: AC
  Filled 2016-11-03: qty 2

## 2016-11-03 MED ORDER — METHYLERGONOVINE MALEATE 0.2 MG/ML IJ SOLN
0.2000 mg | Freq: Once | INTRAMUSCULAR | Status: AC
  Administered 2016-11-03: 0.2 mg via INTRAMUSCULAR

## 2016-11-03 MED ORDER — ESMOLOL HCL 100 MG/10ML IV SOLN
INTRAVENOUS | Status: AC
  Filled 2016-11-03: qty 10

## 2016-11-03 MED ORDER — MORPHINE SULFATE (PF) 2 MG/ML IV SOLN
INTRAVENOUS | Status: AC
  Administered 2016-11-03: 2 mg via INTRAVENOUS
  Filled 2016-11-03: qty 1

## 2016-11-03 MED ORDER — LIDOCAINE HCL (CARDIAC) 20 MG/ML IV SOLN
INTRAVENOUS | Status: DC | PRN
  Administered 2016-11-03: 50 mg via INTRAVENOUS

## 2016-11-03 MED ORDER — BENZOCAINE-MENTHOL 20-0.5 % EX AERO
1.0000 "application " | INHALATION_SPRAY | CUTANEOUS | Status: DC | PRN
  Administered 2016-11-03: 1 via TOPICAL
  Filled 2016-11-03: qty 56

## 2016-11-03 MED ORDER — ACETAMINOPHEN 325 MG PO TABS
650.0000 mg | ORAL_TABLET | ORAL | Status: DC | PRN
Start: 2016-11-03 — End: 2016-11-05

## 2016-11-03 MED ORDER — CEFAZOLIN IN D5W 1 GM/50ML IV SOLN
1.0000 g | Freq: Once | INTRAVENOUS | Status: AC
  Administered 2016-11-03: 1 g via INTRAVENOUS

## 2016-11-03 MED ORDER — ONDANSETRON HCL 4 MG/2ML IJ SOLN
INTRAMUSCULAR | Status: AC
  Filled 2016-11-03: qty 2

## 2016-11-03 MED ORDER — COCONUT OIL OIL
1.0000 | TOPICAL_OIL | Status: DC | PRN
Start: 2016-11-03 — End: 2016-11-04

## 2016-11-03 MED ORDER — FENTANYL CITRATE (PF) 100 MCG/2ML IJ SOLN
INTRAMUSCULAR | Status: AC
  Filled 2016-11-03: qty 2

## 2016-11-03 MED ORDER — DIPHENHYDRAMINE HCL 25 MG PO CAPS
25.0000 mg | ORAL_CAPSULE | Freq: Four times a day (QID) | ORAL | Status: DC | PRN
  Administered 2016-11-04: 25 mg via ORAL
  Filled 2016-11-03: qty 1

## 2016-11-03 MED ORDER — LANOLIN HYDROUS EX OINT: TOPICAL_OINTMENT | CUTANEOUS | Status: DC | PRN

## 2016-11-03 MED ORDER — ONDANSETRON HCL 4 MG PO TABS
4.0000 mg | ORAL_TABLET | ORAL | Status: DC | PRN
Start: 2016-11-03 — End: 2016-11-05

## 2016-11-03 MED ORDER — MORPHINE SULFATE (PF) 2 MG/ML IV SOLN: 1.0000 mg | INTRAVENOUS | Status: DC | PRN

## 2016-11-03 MED ORDER — SENNOSIDES-DOCUSATE SODIUM 8.6-50 MG PO TABS
2.0000 | ORAL_TABLET | ORAL | Status: DC
  Administered 2016-11-04 – 2016-11-05 (×2): 2 via ORAL
  Filled 2016-11-03: qty 2

## 2016-11-03 MED ORDER — FENTANYL CITRATE (PF) 100 MCG/2ML IJ SOLN
INTRAMUSCULAR | Status: DC | PRN
  Administered 2016-11-03 (×3): 50 ug via INTRAVENOUS

## 2016-11-03 MED ORDER — PROPOFOL 10 MG/ML IV BOLUS
INTRAVENOUS | Status: AC
  Filled 2016-11-03: qty 20

## 2016-11-03 MED ORDER — ZOLPIDEM TARTRATE 5 MG PO TABS: 5.0000 mg | ORAL_TABLET | Freq: Every evening | ORAL | Status: DC | PRN

## 2016-11-03 MED ORDER — OXYCODONE HCL 5 MG PO TABS: 5.0000 mg | ORAL_TABLET | Freq: Once | ORAL | Status: DC | PRN

## 2016-11-03 MED ORDER — SODIUM CHLORIDE 0.9 % IV SOLN: 250.0000 mL | INTRAVENOUS | Status: DC | PRN

## 2016-11-03 MED ORDER — LACTATED RINGERS IV SOLN
INTRAVENOUS | Status: DC | PRN
  Administered 2016-11-03: 20:00:00 via INTRAVENOUS

## 2016-11-03 MED ORDER — SODIUM CHLORIDE 0.9% FLUSH: 3.0000 mL | INTRAVENOUS | Status: DC | PRN

## 2016-11-03 MED ORDER — LIDOCAINE HCL (PF) 2 % IJ SOLN
INTRAMUSCULAR | Status: AC
  Filled 2016-11-03: qty 2

## 2016-11-03 MED ORDER — MORPHINE SULFATE (PF) 2 MG/ML IV SOLN
2.0000 mg | Freq: Once | INTRAVENOUS | Status: AC
  Administered 2016-11-03: 2 mg via INTRAVENOUS

## 2016-11-03 MED ORDER — IBUPROFEN 400 MG PO TABS: 400.0000 mg | ORAL_TABLET | Freq: Four times a day (QID) | ORAL | Status: DC | PRN

## 2016-11-03 MED ORDER — CEFAZOLIN IN D5W 1 GM/50ML IV SOLN
INTRAVENOUS | Status: AC
  Administered 2016-11-03: 1 g via INTRAVENOUS
  Filled 2016-11-03: qty 50

## 2016-11-03 MED ORDER — SODIUM CHLORIDE 0.9 % IV SOLN
INTRAVENOUS | Status: DC | PRN
  Administered 2016-11-03: 20:00:00 via INTRAVENOUS

## 2016-11-03 MED ORDER — PROPOFOL 10 MG/ML IV BOLUS
INTRAVENOUS | Status: DC | PRN
  Administered 2016-11-03: 160 mg via INTRAVENOUS

## 2016-11-03 MED ORDER — SODIUM CHLORIDE 0.9% FLUSH
3.0000 mL | Freq: Two times a day (BID) | INTRAVENOUS | Status: DC
  Administered 2016-11-03 – 2016-11-04 (×2): 3 mL via INTRAVENOUS

## 2016-11-03 MED ORDER — ROPIVACAINE HCL 2 MG/ML IJ SOLN
10.0000 mL/h | INTRAMUSCULAR | Status: DC
  Administered 2016-11-03: 10 mL/h via EPIDURAL
  Filled 2016-11-03 (×3): qty 5

## 2016-11-03 MED ORDER — OXYCODONE HCL 5 MG/5ML PO SOLN: 5.0000 mg | Freq: Once | ORAL | Status: DC | PRN

## 2016-11-03 MED ORDER — FENTANYL CITRATE (PF) 100 MCG/2ML IJ SOLN: 25.0000 ug | INTRAMUSCULAR | Status: DC | PRN

## 2016-11-03 MED ORDER — SIMETHICONE 80 MG PO CHEW: 80.0000 mg | CHEWABLE_TABLET | ORAL | Status: DC | PRN

## 2016-11-03 MED ORDER — OXYTOCIN BOLUS FROM INFUSION
500.0000 mL | Freq: Once | INTRAVENOUS | Status: AC
  Administered 2016-11-03: 500 mL via INTRAVENOUS

## 2016-11-03 MED ORDER — DIBUCAINE 1 % RE OINT: 1.0000 "application " | TOPICAL_OINTMENT | RECTAL | Status: DC | PRN

## 2016-11-03 MED ORDER — SUCCINYLCHOLINE CHLORIDE 20 MG/ML IJ SOLN
INTRAMUSCULAR | Status: DC | PRN
  Administered 2016-11-03: 200 mg via INTRAVENOUS

## 2016-11-03 MED ORDER — ONDANSETRON HCL 4 MG/2ML IJ SOLN
INTRAMUSCULAR | Status: DC | PRN
  Administered 2016-11-03: 4 mg via INTRAVENOUS

## 2016-11-03 MED ORDER — IBUPROFEN 600 MG PO TABS
600.0000 mg | ORAL_TABLET | Freq: Four times a day (QID) | ORAL | Status: DC
  Administered 2016-11-03 – 2016-11-05 (×8): 600 mg via ORAL
  Filled 2016-11-03 (×8): qty 1

## 2016-11-03 MED ORDER — METHYLERGONOVINE MALEATE 0.2 MG/ML IJ SOLN
INTRAMUSCULAR | Status: AC
  Administered 2016-11-03: 0.2 mg via INTRAMUSCULAR
  Filled 2016-11-03: qty 1

## 2016-11-03 MED ORDER — WITCH HAZEL-GLYCERIN EX PADS: 1.0000 "application " | MEDICATED_PAD | CUTANEOUS | Status: DC | PRN

## 2016-11-03 MED ORDER — ONDANSETRON HCL 4 MG/2ML IJ SOLN: 4.0000 mg | INTRAMUSCULAR | Status: DC | PRN

## 2016-11-03 MED ORDER — SUCCINYLCHOLINE CHLORIDE 200 MG/10ML IV SOSY
PREFILLED_SYRINGE | INTRAVENOUS | Status: AC
  Filled 2016-11-03: qty 10

## 2016-11-03 MED ORDER — OXYCODONE-ACETAMINOPHEN 5-325 MG PO TABS: 2.0000 | ORAL_TABLET | ORAL | Status: DC | PRN

## 2016-11-03 SURGICAL SUPPLY — 21 items
BAG COUNTER SPONGE EZ (MISCELLANEOUS) ×3 IMPLANT
CANISTER SUC SOCK COL 7IN (MISCELLANEOUS) ×3 IMPLANT
CATH ROBINSON RED A/P 16FR (CATHETERS) ×3 IMPLANT
FILTER UTR ASPR SPEC (MISCELLANEOUS) ×2 IMPLANT
FLTR UTR ASPR SPEC (MISCELLANEOUS) ×3
GLOVE BIO SURGEON STRL SZ8 (GLOVE) ×3 IMPLANT
GOWN STRL REUS W/ TWL LRG LVL3 (GOWN DISPOSABLE) ×2 IMPLANT
GOWN STRL REUS W/ TWL XL LVL3 (GOWN DISPOSABLE) ×2 IMPLANT
GOWN STRL REUS W/TWL LRG LVL3 (GOWN DISPOSABLE) ×1
GOWN STRL REUS W/TWL XL LVL3 (GOWN DISPOSABLE) ×1
KIT BERKELEY 1ST TRIMESTER 3/8 (MISCELLANEOUS) ×3 IMPLANT
KIT RM TURNOVER CYSTO AR (KITS) ×3 IMPLANT
NS IRRIG 500ML POUR BTL (IV SOLUTION) ×3 IMPLANT
PACK DNC HYST (MISCELLANEOUS) ×3 IMPLANT
PAD OB MATERNITY 4.3X12.25 (PERSONAL CARE ITEMS) ×3 IMPLANT
PAD PREP 24X41 OB/GYN DISP (PERSONAL CARE ITEMS) ×3 IMPLANT
SET BERKELEY SUCTION TUBING (SUCTIONS) ×3 IMPLANT
TOWEL OR 17X26 4PK STRL BLUE (TOWEL DISPOSABLE) ×3 IMPLANT
VACURETTE 10 RIGID CVD (CANNULA) IMPLANT
VACURETTE 12 RIGID CVD (CANNULA) IMPLANT
VACURETTE 8 RIGID CVD (CANNULA) IMPLANT

## 2016-11-03 NOTE — Progress Notes (Signed)
Called to see patient due to pain in hips and bleeding/clots noted on exam. Pt had tachycardia and higher BP's, also BS of 219 (drank three sodas). Pt currently describes constant, mod-sev non-radiating pain with no other assoc sx's. Fundus firm and abd mildly T to palpation. Vagina/vulva intact.  Plan to monitor. Pitocin in IVF and also received one dose Methergine (BP was normal at that time of administration). Consider D&C under anesthesia for potential retained placenta Pros and cons discussed as far as surgery for this is concerned in a postpartum state (uterine nfection or perforation are risks, as well as bleeding and injury to adjacent organs, and anesthesia risks).

## 2016-11-03 NOTE — Anesthesia Postprocedure Evaluation (Signed)
Anesthesia Post Note  Patient: Sierra Jordan  Procedure(s) Performed: Procedure(s) (LRB): DILATATION AND CURETTAGE (N/A)  Patient location during evaluation: PACU Anesthesia Type: Epidural Level of consciousness: awake and alert Pain management: pain level controlled Vital Signs Assessment: post-procedure vital signs reviewed and stable Respiratory status: spontaneous breathing, nonlabored ventilation, respiratory function stable and patient connected to nasal cannula oxygen Cardiovascular status: blood pressure returned to baseline and stable Postop Assessment: no signs of nausea or vomiting Anesthetic complications: no     Last Vitals:  Vitals:   11/03/16 2106 11/03/16 2107  BP: (!) 143/90 139/90  Pulse: 92 91  Resp: 20   Temp: 36.8 C     Last Pain:  Vitals:   11/03/16 2106  TempSrc: Oral  PainSc:                  Cleda MccreedyJoseph K Piscitello

## 2016-11-03 NOTE — Discharge Summary (Signed)
OB Discharge Summary     Patient Name: Sierra Jordan DOB: 04/13/1991 MRN: [id number]  Date of admission: 11/02/2016 Delivering MD: Letitia Libraobert Paul Harris, MD  Date of Delivery: 11/03/2016  Date of discharge: 11/05/2016  Admitting diagnosis: 39 wks, contractions na Intrauterine pregnancy: 7334w0d     Secondary diagnosis: Gestational Hypertension     Discharge diagnosis: Term Pregnancy Delivered, Gestational Hypertension, GDM A1 and Anemia                                                                                                Post partum procedures:curettage   Augmentation: AROM and Pitocin  Complications: None  Hospital course:  Induction of Labor With Vaginal Delivery   26 y.o. yo G6P0050 at 1434w0d was admitted to the hospital 11/02/2016 for induction of labor.  Indication for induction: Gestational hypertension and A1 DM.  Patient had an uncomplicated labor course as follows: Membrane Rupture Time/Date: 8:20 AM ,11/03/2016   Intrapartum Procedures: Episiotomy: None [1] None                                        Lacerations:  Periurethral [8] Left Periurethral Patient had delivery of a Viable infant.  Emerie. Information for the patient's newborn:  Jackelyn PolingLeath, Girl Naveena [[id number]][[id number]]  Delivery Method: Vag-Spont   11/03/2016  Details of delivery can be found in separate delivery note.  Patient had a routine postpartum course.  Patient had PPH and hip pain postpartum. Taken to OR on 2/7 for D&C.  Monitored for blood counts postpartum  Discharged hgb 6.8.  Minimal lochia. Patient completely asymptomatic while ambulating.   Otherwise, doing very well.  Patient is discharged home 11/05/16.  Physical exam  Vitals:   11/04/16 1919 11/04/16 2304 11/05/16 0405 11/05/16 0721  BP: 122/64 131/78 129/82 (!) 145/86  Pulse: (!) 105 91 83 89  Resp: 20 20 20 14   Temp: 98.4 F (36.9 C) 97.2 F (36.2 C) 98.3 F (36.8 C) 98.2 F (36.8 C)  TempSrc: Oral Oral Oral Oral  SpO2:    100%  Weight:       Height:       General: alert, cooperative and no distress Lochia: appropriate Uterine Fundus: firm Incision: N/A DVT Evaluation: No evidence of DVT seen on physical exam. No cords or calf tenderness. No significant calf/ankle edema.  Labs: Lab Results  Component Value Date   WBC 17.6 (H) 11/04/2016   HGB 6.8 (L) 11/05/2016   HCT 21.1 (L) 11/04/2016   MCV 79.9 (L) 11/04/2016   PLT 306 11/04/2016   CMP Latest Ref Rng & Units 11/02/2016  Glucose 65 - 99 mg/dL 98  BUN 6 - 20 mg/dL 9  Creatinine 6.290.44 - 5.281.00 mg/dL 4.130.48  Sodium 244135 - 010145 mmol/L 134(L)  Potassium 3.5 - 5.1 mmol/L 3.6  Chloride 101 - 111 mmol/L 104  CO2 22 - 32 mmol/L 23  Calcium 8.9 - 10.3 mg/dL 2.7(O8.5(L)  Total Protein 6.5 - 8.1 g/dL 6.9  Total Bilirubin 0.3 - 1.2 mg/dL  0.4  Alkaline Phos 38 - 126 U/L 181(H)  AST 15 - 41 U/L 19  ALT 14 - 54 U/L 12(L)    Discharge instruction: per After Visit Summary.  Medications:  Allergies as of 11/05/2016   No Known Allergies     Medication List    STOP taking these medications   promethazine 25 MG tablet Commonly known as:  PHENERGAN     TAKE these medications   acetaminophen 325 MG tablet Commonly known as:  TYLENOL Take 325 mg by mouth every 4 (four) hours as needed.   naproxen sodium 550 MG tablet Commonly known as:  ANAPROX Take 1 tablet (550 mg total) by mouth 2 (two) times daily as needed for mild pain or moderate pain.   prenatal multivitamin Tabs tablet Take 1 tablet by mouth daily.       Diet: routine diet  Activity: Advance as tolerated. Pelvic rest for 6 weeks.   Outpatient follow up: Follow-up Information    Letitia Libra, MD. Schedule an appointment as soon as possible for a visit in 6 week(s).   Specialty:  Obstetrics and Gynecology Contact information: [address] [place] [address] [phone]            Rhogam Given postpartum: no Rubella vaccine given postpartum: no Varicella vaccine given postpartum:  no TDaP given antepartum or postpartum: in pregnancy  Newborn Data: Live born unspecified sex  Birth Weight: 6 lb 3.5 oz (2820 g) APGAR: ,    Baby Feeding: formula  Disposition:home with mother  SIGNED Conard Novak, MD 11/05/2016 10:13 AM

## 2016-11-03 NOTE — Progress Notes (Signed)
  Labor Progress Note   26 y.o. [id number] @ 1369w0d , admitted for  Pregnancy, Labor Management. GDM and gHTN.  Subjective:  No pain after Epidural. BS 77  Objective:  BP 134/76   Pulse (!) 105   Temp 98.4 F (36.9 C) (Oral)   Resp 20   Ht 5\' 3"  (1.6 m)   Wt 241 lb (109.3 kg)   LMP 02/04/2016 Comment: pt is pregnant; G6P0A5  SpO2 100%   BMI 42.69 kg/m  Abd: gravid VTX Extr: trace to 1+ bilateral pedal edema SVE: CERVIX: 7 cm dilated, 90 effaced, +1 station  EFM: FHR: 140 bpm, variability: moderate,  accelerations:  Present,  decelerations:  Absent Toco: Frequency: Every 3-4 minutes Labs: I have reviewed the patient's lab results.   Assessment & Plan:  [id number] @ 6069w0d, admitted for  Pregnancy and Labor/Delivery Management  1. Pain management: epidural. 2. FWB: FHT category 1.  3. ID: GBS negative 4. Labor management: AROM now clear; IUPC placed; Progressing well.  BS normal.  BP under control.   All discussed with patient, see orders

## 2016-11-03 NOTE — Transfer of Care (Signed)
Immediate Anesthesia Transfer of Care Note  Patient: Sierra Jordan  Procedure(s) Performed: Procedure(s): DILATATION AND CURETTAGE (N/A)  Patient Location: PACU  Anesthesia Type:General  Level of Consciousness: awake, alert , oriented and patient cooperative  Airway & Oxygen Therapy: Patient Spontanous Breathing and Patient connected to face mask oxygen  Post-op Assessment: Report given to RN and Post -op Vital signs reviewed and stable  Post vital signs: Reviewed and stable  Last Vitals:  Vitals:   11/03/16 1839 11/03/16 2016  BP: (!) 139/94   Pulse: 91   Resp:    Temp:  (P) 36.1 C    Last Pain:  Vitals:   11/03/16 1830  TempSrc:   PainSc: 7          Complications: No apparent anesthesia complications

## 2016-11-03 NOTE — BH Specialist Note (Signed)
Firm fundus 2 fingerbreadths below umbilicus.

## 2016-11-03 NOTE — Op Note (Signed)
Operative Note  11/03/2016  PRE-OP DIAGNOSIS: Post Partum Hemorrhage and pain  POST-OP DIAGNOSIS: same   SURGEON: Annamarie MajorPaul Lasonya Hubner, MD, FACOG  PROCEDURE: Procedure(s): DILATATION AND CURETTAGE   ANESTHESIA: General   ESTIMATED BLOOD LOSS: 100 mL  SPECIMENS:  EMC/ POC  COMPLICATIONS: None  DISPOSITION: PACU - hemodynamically stable.  CONDITION: stable  FINDINGS: Exam under anesthesia revealed small, mobile enlarged post partum uterus  PROCEDURE IN DETAIL: After informed consent was obtained, the patient was taken to the operating room where anesthesia was obtained without difficulty. The patient was positioned in the dorsal lithotomy position in [place] stirrups. The patient's bladder was catheterized with an in and out foley catheter. The patient was examined under anesthesia, with the above noted findings. The weighted speculum was placed inside the patient's vagina, and the the anterior lip of the cervix was seen and grasped with the tenaculum. The uterine cavity was curetted until a gritty texture was noted, yielding specimen for endometrial curettings. Clot and some tissue that may be placental is removed.  Excellent hemostasis was noted, and all instruments were removed, with excellent hemostasis noted throughout. She was then taken out of dorsal lithotomy. The patient tolerated the procedure well. Sponge, lap and needle counts were correct x2. The patient was taken to recovery room in excellent condition.

## 2016-11-03 NOTE — Anesthesia Preprocedure Evaluation (Signed)
Anesthesia Evaluation  Patient identified by MRN, date of birth, ID band Patient awake    Reviewed: Allergy & Precautions, H&P , NPO status , Patient's Chart, lab work & pertinent test results, reviewed documented beta blocker date and time   History of Anesthesia Complications Negative for: history of anesthetic complications  Airway Mallampati: III  TM Distance: >3 FB Neck ROM: full    Dental  (+) Poor Dentition, Chipped   Pulmonary neg shortness of breath, former smoker,           Cardiovascular Exercise Tolerance: Good hypertension (Gestational), (-) angina(-) CAD, (-) Past MI, (-) Cardiac Stents and (-) CABG (-) dysrhythmias (-) Valvular Problems/Murmurs Rhythm:regular Rate:Tachycardia     Neuro/Psych negative neurological ROS  negative psych ROS   GI/Hepatic Neg liver ROS, GERD  Controlled,  Endo/Other  diabetes (gestational)Morbid obesity  Renal/GU negative Renal ROS  negative genitourinary   Musculoskeletal   Abdominal   Peds  Hematology negative hematology ROS (+)   Anesthesia Other Findings Post partum hemorrhage  BMI    Body Mass Index:  42.69 kg/m      Reproductive/Obstetrics (+) Pregnancy                             Anesthesia Physical  Anesthesia Plan  ASA: IV and emergent  Anesthesia Plan: Epidural and General ETT   Post-op Pain Management:    Induction:   Airway Management Planned:   Additional Equipment:   Intra-op Plan:   Post-operative Plan:   Informed Consent: I have reviewed the patients History and Physical, chart, labs and discussed the procedure including the risks, benefits and alternatives for the proposed anesthesia with the patient or authorized representative who has indicated his/her understanding and acceptance.   Dental Advisory Given  Plan Discussed with: Anesthesiologist, CRNA and Surgeon  Anesthesia Plan Comments:          Anesthesia Quick Evaluation

## 2016-11-03 NOTE — Anesthesia Procedure Notes (Addendum)
Procedure Name: Intubation Date/Time: 11/03/2016 7:47 PM Performed by: Waldo LaineJUSTIS, Rashanda Magloire Pre-anesthesia Checklist: Patient identified, Emergency Drugs available, Suction available, Patient being monitored and Timeout performed Patient Re-evaluated:Patient Re-evaluated prior to inductionOxygen Delivery Method: Circle system utilized Preoxygenation: Pre-oxygenation with 100% oxygen Intubation Type: IV induction and Rapid sequence Laryngoscope Size: Miller and 2 Grade View: Grade II Laser Tube: Cuffed inflated with minimal occlusive pressure - saline Number of attempts: 1 Airway Equipment and Method: Stylet Placement Confirmation: ETT inserted through vocal cords under direct vision,  positive ETCO2 and breath sounds checked- equal and bilateral Secured at: 21 cm Tube secured with: Tape Dental Injury: Teeth and Oropharynx as per pre-operative assessment

## 2016-11-03 NOTE — Anesthesia Post-op Follow-up Note (Cosign Needed)
Anesthesia QCDR form completed.        

## 2016-11-04 ENCOUNTER — Encounter: Payer: Self-pay | Admitting: Obstetrics & Gynecology

## 2016-11-04 LAB — CBC
HCT: 21.1 % — ABNORMAL LOW (ref 35.0–47.0)
Hemoglobin: 7.5 g/dL — ABNORMAL LOW (ref 12.0–16.0)
MCH: 28.3 pg (ref 26.0–34.0)
MCHC: 35.4 g/dL (ref 32.0–36.0)
MCV: 79.9 fL — ABNORMAL LOW (ref 80.0–100.0)
Platelets: 306 10*3/uL (ref 150–440)
RBC: 2.64 MIL/uL — ABNORMAL LOW (ref 3.80–5.20)
RDW: 12.8 % (ref 11.5–14.5)
WBC: 17.6 10*3/uL — ABNORMAL HIGH (ref 3.6–11.0)

## 2016-11-04 LAB — GLUCOSE, CAPILLARY: Glucose-Capillary: 99 mg/dL (ref 65–99)

## 2016-11-04 NOTE — Progress Notes (Signed)
Dr. Tiburcio PeaHarris present when M. Jeffreys CNA reported patient current VS and said ok to saline lock patient IV and will draw labs in the morning.

## 2016-11-04 NOTE — Clinical Social Work Note (Signed)
The following is the CSW documentation placed on the patient's newborn's [place] MATERNAL/CHILD NOTE  Patient Details  Name: Sierra Jordan MRN: [id number] Date of Birth: 11/03/2017  Date:  11/04/2016  Clinical Social Worker Initiating Note:   [facility] Nicholaus Corolla MSW,LCSW)      Date/ Time Initiated:   /      Child's Name:      Legal Guardian:  Mother   Need for Interpreter:  None   Date of Referral:        Reason for Referral:  Current Substance Use/Substance Use During Pregnancy    Referral Source:  RN   Address:     Phone number:      Household Members: Self, Parents   Natural Supports (not living in the home): Friends, Spouse/significant other   Professional Supports:    Employment:    Type of Work:     Education:      Museum/gallery curator Resources:Medicaid   Other Resources:     Cultural/Religious Considerations Which May Impact Care: none  Strengths: Ability to meet basic needs , Compliance with medical plan , Home prepared for child    Risk Factors/Current Problems: Substance Use    Cognitive State: Alert    Mood/Affect:  (pleasant)   CSW Assessment:CSW consulted due to patient's mother testing positive for marijuana on admission. Patient's urine was tested and came back negative. The cord was sent off for testing and if positive, a DSS CPS report will be made by CSW. CSW met with patient's mother who was holding and bottle feeding her newborn. Patient's mother states that she has all necessities for her newborn. She states that she will be living with her mother and that this is her first child. Patient's mother aware that both their UDS' came back negative. CSW explained that if the cord blood returned as positive, that a DSS report would be made. Patient's mother quickly changed the subject and asked how she could apply for medicaid for her newborn. CSW answered her questions.   CSW  Plan/Description: Psychosocial Support and Ongoing Assessment of Needs, Information/Referral to [employer], [place] 11/04/2016, 1:14 PM

## 2016-11-04 NOTE — Anesthesia Postprocedure Evaluation (Addendum)
Anesthesia Post Note  Patient: Celestia KhatEricka L Barraclough  Procedure(s) Performed: * No procedures listed *  Patient location during evaluation: Mother Baby Anesthesia Type: Epidural Level of consciousness: awake and alert and oriented Pain management: pain level controlled Vital Signs Assessment: post-procedure vital signs reviewed and stable Respiratory status: spontaneous breathing Cardiovascular status: stable Postop Assessment: adequate PO intake and no signs of nausea or vomiting Anesthetic complications: no     Last Vitals:  Vitals:   11/04/16 0430 11/04/16 0741  BP: 122/68 121/71  Pulse: (!) 117 (!) 102  Resp: 18 18  Temp: 36.7 C 36.6 C    Last Pain:  Vitals:   11/04/16 0741  TempSrc: Oral  PainSc:                  Rica MastBachich,  Alayiah Fontes M

## 2016-11-04 NOTE — Progress Notes (Signed)
Admit Date: 11/02/2016 Today's Date: 11/04/2016  Post Partum Day 1, POD#1 D&C for PPH  Subjective:  no complaints, voiding and tolerating PO  Pt denies dizziness or heavy bleeding.  Min pain since having surgery last night.  Objective: Temp:  [97 F (36.1 C)-98.3 F (36.8 C)] 97.8 F (36.6 C) (02/08 0741) Pulse Rate:  [91-138] 102 (02/08 0741) Resp:  [17-20] 18 (02/08 0741) BP: (102-148)/(68-98) 121/71 (02/08 0741) SpO2:  [98 %-100 %] 100 % (02/08 0741)  Physical Exam:  General: alert, cooperative and no distress Lochia: appropriate Uterine Fundus: firm Incision: none DVT Evaluation: No evidence of DVT seen on physical exam. Negative Homan's sign.   Recent Labs  11/03/16 2009 11/04/16 0556  HGB 9.3* 7.5*  HCT 27.1* 21.1*    Assessment/Plan: Contraception (plans OCPs), Bottle Feeding and Infant doing well  Pt doing well, has anemia after PPH    Asymptomatic at this time but need to see how she does once ambulating.  VSS.    Consideration for transfusion discussed.    Fe. D&C and its findings d/w pt.  Likely small amount of retained placenta but difficult to tell at time of surgery, but as she has done considerably well since surgery then confirms liklihood of this etiology   LOS: 2 days   Letitia Libraobert Paul Gladie [facility] 11/04/2016, 7:53 AM

## 2016-11-05 LAB — TYPE AND SCREEN
ABO/RH(D): A POS
Antibody Screen: NEGATIVE
Unit division: 0
Unit division: 0

## 2016-11-05 LAB — HEMOGLOBIN: Hemoglobin: 6.8 g/dL — ABNORMAL LOW (ref 12.0–16.0)

## 2016-11-05 LAB — PREPARE RBC (CROSSMATCH)

## 2016-11-05 LAB — SURGICAL PATHOLOGY

## 2016-11-05 MED ORDER — NAPROXEN SODIUM 550 MG PO TABS: 550.0000 mg | ORAL_TABLET | Freq: Two times a day (BID) | ORAL | 0 refills | Status: DC | PRN

## 2016-11-05 NOTE — Progress Notes (Signed)
Discharge inst reviewed with pt.  Verb u/o 

## 2016-11-05 NOTE — Discharge Instructions (Signed)
Please call your doctor or return to the ER if you experience any chest pains, shortness of breath, fever greater than 101, any heavy bleeding or large clots, and foul smelling vaginal discharge, any worsening abdominal pain & cramping that is not controlled by pain medication, or any signs of post partum depression.  No tampons, enemas, douches, or sexual intercourse for 6 weeks.  Also avoid tub baths, hot tubs, or swimming for 6 weeks.  Vaginal Delivery, Care After Refer to this sheet in the next few weeks. These instructions provide you with information about caring for yourself after vaginal delivery. Your health care provider may also give you more specific instructions. Your treatment has been planned according to current medical practices, but problems sometimes occur. Call your health care provider if you have any problems or questions. What can I expect after the procedure? After vaginal delivery, it is common to have:  Some bleeding from your vagina.  Soreness in your abdomen, your vagina, and the area of skin between your vaginal opening and your anus (perineum).  Pelvic cramps.  Fatigue. Follow these instructions at home: Medicines  Take over-the-counter and prescription medicines only as told by your health care provider.  If you were prescribed an antibiotic medicine, take it as told by your health care provider. Do not stop taking the antibiotic until it is finished. Driving  Do not drive or operate heavy machinery while taking prescription pain medicine.  Do not drive for 24 hours if you received a sedative. Lifestyle  Do not drink alcohol. This is especially important if you are breastfeeding or taking medicine to relieve pain.  Do not use tobacco products, including cigarettes, chewing tobacco, or e-cigarettes. If you need help quitting, ask your health care provider. Eating and drinking  Drink at least 8 eight-ounce glasses of water every day unless you are told not  to by your health care provider. If you choose to breastfeed your baby, you may need to drink more water than this.  Eat high-fiber foods every day. These foods may help prevent or relieve constipation. High-fiber foods include:  Whole grain cereals and breads.  Brown rice.  Beans.  Fresh fruits and vegetables. Activity  Return to your normal activities as told by your health care provider. Ask your health care provider what activities are safe for you.  Rest as much as possible. Try to rest or take a nap when your baby is sleeping.  Do not lift anything that is heavier than your baby or 10 lb (4.5 kg) until your health care provider says that it is safe.  Talk with your health care provider about when you can engage in sexual activity. This may depend on your:  Risk of infection.  Rate of healing.  Comfort and desire to engage in sexual activity. Vaginal Care  If you have an episiotomy or a vaginal tear, check the area every day for signs of infection. Check for:  More redness, swelling, or pain.  More fluid or blood.  Warmth.  Pus or a bad smell.  Do not use tampons or douches until your health care provider says this is safe.  Watch for any blood clots that may pass from your vagina. These may look like clumps of dark red, brown, or black discharge. General instructions  Keep your perineum clean and dry as told by your health care provider.  Wear loose, comfortable clothing.  Wipe from front to back when you use the toilet.  Ask your health  care provider if you can shower or take a bath. If you had an episiotomy or a perineal tear during labor and delivery, your health care provider may tell you not to take baths for a certain length of time.  Wear a bra that supports your breasts and fits you well.  If possible, have someone help you with household activities and help care for your baby for at least a few days after you leave the hospital.  Keep all  follow-up visits for you and your baby as told by your health care provider. This is important. Contact a health care provider if:  You have:  Vaginal discharge that has a bad smell.  Difficulty urinating.  Pain when urinating.  A sudden increase or decrease in the frequency of your bowel movements.  More redness, swelling, or pain around your episiotomy or vaginal tear.  More fluid or blood coming from your episiotomy or vaginal tear.  Pus or a bad smell coming from your episiotomy or vaginal tear.  A fever.  A rash.  Little or no interest in activities you used to enjoy.  Questions about caring for yourself or your baby.  Your episiotomy or vaginal tear feels warm to the touch.  Your episiotomy or vaginal tear is separating or does not appear to be healing.  Your breasts are painful, hard, or turn red.  You feel unusually sad or worried.  You feel nauseous or you vomit.  You pass large blood clots from your vagina. If you pass a blood clot from your vagina, save it to show to your health care provider. Do not flush blood clots down the toilet without having your health care provider look at them.  You urinate more than usual.  You are dizzy or light-headed.  You have not breastfed at all and you have not had a menstrual period for 12 weeks after delivery.  You have stopped breastfeeding and you have not had a menstrual period for 12 weeks after you stopped breastfeeding. Get help right away if:  You have:  Pain that does not go away or does not get better with medicine.  Chest pain.  Difficulty breathing.  Blurred vision or spots in your vision.  Thoughts about hurting yourself or your baby.  You develop pain in your abdomen or in one of your legs.  You develop a severe headache.  You faint.  You bleed from your vagina so much that you fill two sanitary pads in one hour. This information is not intended to replace advice given to you by your health  care provider. Make sure you discuss any questions you have with your health care provider. Document Released: 09/10/2000 Document Revised: 02/25/2016 Document Reviewed: 09/28/2015 Elsevier Interactive Patient Education  2017 [employer]

## 2016-11-05 NOTE — Progress Notes (Signed)
Patient watched Period of Purple Crying video and given a copy to take home for family

## 2016-12-17 ENCOUNTER — Encounter: Payer: Self-pay | Admitting: Obstetrics & Gynecology

## 2017-05-29 ENCOUNTER — Emergency Department
Admission: EM | Admit: 2017-05-29 | Discharge: 2017-05-29 | Disposition: A | Discharge status: Home / Self Care | Payer: Self-pay | Attending: Emergency Medicine | Admitting: Emergency Medicine

## 2017-05-29 ENCOUNTER — Encounter: Payer: Self-pay | Admitting: Emergency Medicine

## 2017-05-29 DIAGNOSIS — R1110 Vomiting, unspecified: Secondary | ICD-10-CM | POA: Insufficient documentation

## 2017-05-29 DIAGNOSIS — Z5321 Procedure and treatment not carried out due to patient leaving prior to being seen by health care provider: Secondary | ICD-10-CM | POA: Insufficient documentation

## 2017-05-29 LAB — COMPREHENSIVE METABOLIC PANEL
ALT: 11 U/L — ABNORMAL LOW (ref 14–54)
AST: 17 U/L (ref 15–41)
Albumin: 3.7 g/dL (ref 3.5–5.0)
Alkaline Phosphatase: 61 U/L (ref 38–126)
Anion gap: 5 (ref 5–15)
BUN: 19 mg/dL (ref 6–20)
CO2: 26 mmol/L (ref 22–32)
Calcium: 8.7 mg/dL — ABNORMAL LOW (ref 8.9–10.3)
Chloride: 105 mmol/L (ref 101–111)
Creatinine, Ser: 0.65 mg/dL (ref 0.44–1.00)
GFR calc Af Amer: >60 mL/min (ref >60)
GFR calc non Af Amer: >60 mL/min (ref >60)
Glucose, Bld: 103 mg/dL — ABNORMAL HIGH (ref 65–99)
Potassium: 3.8 mmol/L (ref 3.5–5.1)
Sodium: 136 mmol/L (ref 135–145)
Total Bilirubin: 0.4 mg/dL (ref 0.3–1.2)
Total Protein: 7.3 g/dL (ref 6.5–8.1)

## 2017-05-29 LAB — CBC
HCT: 30.9 % — ABNORMAL LOW (ref 35.0–47.0)
Hemoglobin: 10.8 g/dL — ABNORMAL LOW (ref 12.0–16.0)
MCH: 24 pg — ABNORMAL LOW (ref 26.0–34.0)
MCHC: 34.9 g/dL (ref 32.0–36.0)
MCV: 68.8 fL — ABNORMAL LOW (ref 80.0–100.0)
Platelets: 358 10*3/uL (ref 150–440)
RBC: 4.5 MIL/uL (ref 3.80–5.20)
RDW: 19.9 % — ABNORMAL HIGH (ref 11.5–14.5)
WBC: 6.7 10*3/uL (ref 3.6–11.0)

## 2017-05-29 LAB — URINALYSIS, COMPLETE (UACMP) WITH MICROSCOPIC
Bacteria, UA: NONE SEEN
Bilirubin Urine: NEGATIVE
Glucose, UA: NEGATIVE mg/dL
Ketones, ur: NEGATIVE mg/dL
Leukocytes, UA: NEGATIVE
Nitrite: NEGATIVE
Protein, ur: NEGATIVE mg/dL
Specific Gravity, Urine: 1.016 (ref 1.005–1.030)
pH: 7 (ref 5.0–8.0)

## 2017-05-29 LAB — POCT PREGNANCY, URINE: Preg Test, Ur: NEGATIVE

## 2017-05-29 LAB — LIPASE, BLOOD: Lipase: 28 U/L (ref 11–51)

## 2017-05-29 NOTE — ED Notes (Signed)
Called for room again, Dr. Cyril LoosenKinner attempted to call pt, no answer.  LBWS.

## 2017-05-29 NOTE — ED Notes (Signed)
Called for room no answer

## 2017-05-29 NOTE — ED Notes (Signed)
FIRST NURSE NOTE:   Pt states she has been vomiting since 4am.  Pt states she can't keep anything down, vomited multiple times today.

## 2017-05-29 NOTE — ED Triage Notes (Signed)
Pt c/o vomiting x 3 days. Pt reports emesis x8 since 0400 this morning. Pt states she was able to keep food down all day yesterday. Pt reports no difficulty keeping liquids down.

## 2017-05-29 NOTE — ED Notes (Signed)
POC urine preg negative 

## 2018-07-15 ENCOUNTER — Emergency Department: Payer: Self-pay

## 2018-07-15 ENCOUNTER — Encounter: Payer: Self-pay | Admitting: Emergency Medicine

## 2018-07-15 ENCOUNTER — Inpatient Hospital Stay
Admission: EM | Admit: 2018-07-15 | Discharge: 2018-07-17 | DRG: 833 | Disposition: A | Discharge status: Home / Self Care | Payer: Self-pay | Attending: Obstetrics and Gynecology | Admitting: Obstetrics and Gynecology

## 2018-07-15 ENCOUNTER — Other Ambulatory Visit: Payer: Self-pay

## 2018-07-15 DIAGNOSIS — Z3A08 8 weeks gestation of pregnancy: Secondary | ICD-10-CM

## 2018-07-15 DIAGNOSIS — O211 Hyperemesis gravidarum with metabolic disturbance: Principal | ICD-10-CM

## 2018-07-15 DIAGNOSIS — Z87891 Personal history of nicotine dependence: Secondary | ICD-10-CM

## 2018-07-15 DIAGNOSIS — N939 Abnormal uterine and vaginal bleeding, unspecified: Secondary | ICD-10-CM

## 2018-07-15 DIAGNOSIS — R112 Nausea with vomiting, unspecified: Secondary | ICD-10-CM

## 2018-07-15 LAB — COMPREHENSIVE METABOLIC PANEL
ALT: 14 U/L (ref 0–44)
ALT: 15 U/L (ref 0–44)
AST: 26 U/L (ref 15–41)
AST: 18 U/L (ref 15–41)
Albumin: 4.2 g/dL (ref 3.5–5.0)
Albumin: 3.4 g/dL — ABNORMAL LOW (ref 3.5–5.0)
Alkaline Phosphatase: 46 U/L (ref 38–126)
Alkaline Phosphatase: 39 U/L (ref 38–126)
Anion gap: 12 (ref 5–15)
Anion gap: 6 (ref 5–15)
BUN: 16 mg/dL (ref 6–20)
BUN: 9 mg/dL (ref 6–20)
CO2: 20 mmol/L — ABNORMAL LOW (ref 22–32)
CO2: 21 mmol/L — ABNORMAL LOW (ref 22–32)
Calcium: 8.9 mg/dL (ref 8.9–10.3)
Calcium: 7.8 mg/dL — ABNORMAL LOW (ref 8.9–10.3)
Chloride: 103 mmol/L (ref 98–111)
Chloride: 110 mmol/L (ref 98–111)
Creatinine, Ser: 0.72 mg/dL (ref 0.44–1.00)
Creatinine, Ser: 0.48 mg/dL (ref 0.44–1.00)
GFR calc Af Amer: >60 mL/min (ref >60)
GFR calc Af Amer: >60 mL/min (ref >60)
GFR calc non Af Amer: >60 mL/min (ref >60)
GFR calc non Af Amer: >60 mL/min (ref >60)
Glucose, Bld: 168 mg/dL — ABNORMAL HIGH (ref 70–99)
Glucose, Bld: 100 mg/dL — ABNORMAL HIGH (ref 70–99)
Potassium: 3 mmol/L — ABNORMAL LOW (ref 3.5–5.1)
Potassium: 3.5 mmol/L (ref 3.5–5.1)
Sodium: 135 mmol/L (ref 135–145)
Sodium: 137 mmol/L (ref 135–145)
Total Bilirubin: 0.8 mg/dL (ref 0.3–1.2)
Total Bilirubin: 0.8 mg/dL (ref 0.3–1.2)
Total Protein: 7.9 g/dL (ref 6.5–8.1)
Total Protein: 6.6 g/dL (ref 6.5–8.1)

## 2018-07-15 LAB — URINALYSIS, COMPLETE (UACMP) WITH MICROSCOPIC
Bacteria, UA: NONE SEEN
Bilirubin Urine: NEGATIVE
Glucose, UA: NEGATIVE mg/dL
Hgb urine dipstick: NEGATIVE
Ketones, ur: 80 mg/dL — AB
Nitrite: NEGATIVE
Protein, ur: 100 mg/dL — AB
Specific Gravity, Urine: 1.032 — ABNORMAL HIGH (ref 1.005–1.030)
pH: 6 (ref 5.0–8.0)

## 2018-07-15 LAB — ABO/RH: ABO/RH(D): A POS

## 2018-07-15 LAB — CBC WITH DIFFERENTIAL/PLATELET
Abs Immature Granulocytes: 0.05 10*3/uL (ref 0.00–0.07)
Basophils Absolute: 0 10*3/uL (ref 0.0–0.1)
Basophils Relative: 0 %
Eosinophils Absolute: 0 10*3/uL (ref 0.0–0.5)
Eosinophils Relative: 0 %
HCT: 34 % — ABNORMAL LOW (ref 36.0–46.0)
Hemoglobin: 12.2 g/dL (ref 12.0–15.0)
Immature Granulocytes: 1 %
Lymphocytes Relative: 32 %
Lymphs Abs: 2.9 10*3/uL (ref 0.7–4.0)
MCH: 27.5 pg (ref 26.0–34.0)
MCHC: 35.9 g/dL (ref 30.0–36.0)
MCV: 76.6 fL — ABNORMAL LOW (ref 80.0–100.0)
Monocytes Absolute: 0.3 10*3/uL (ref 0.1–1.0)
Monocytes Relative: 4 %
Neutro Abs: 5.8 10*3/uL (ref 1.7–7.7)
Neutrophils Relative %: 63 %
Platelets: 398 10*3/uL (ref 150–400)
RBC: 4.44 MIL/uL (ref 3.87–5.11)
RDW: 14.9 % (ref 11.5–15.5)
WBC: 9.1 10*3/uL (ref 4.0–10.5)
nRBC: 0 % (ref 0.0–0.2)

## 2018-07-15 LAB — LIPASE, BLOOD: Lipase: 24 U/L (ref 11–51)

## 2018-07-15 LAB — HCG, QUANTITATIVE, PREGNANCY: hCG, Beta Chain, Quant, S: 88876 m[IU]/mL — ABNORMAL HIGH (ref <5)

## 2018-07-15 MED ORDER — THIAMINE HCL 100 MG/ML IJ SOLN
Freq: Once | INTRAVENOUS | Status: AC
  Administered 2018-07-15: 17:00:00 via INTRAVENOUS
  Filled 2018-07-15: qty 1000

## 2018-07-15 MED ORDER — POTASSIUM CHLORIDE 2 MEQ/ML IV SOLN
INTRAVENOUS | Status: DC
  Administered 2018-07-15 – 2018-07-17 (×6): via INTRAVENOUS
  Filled 2018-07-15 (×9): qty 1000

## 2018-07-15 MED ORDER — SODIUM CHLORIDE 0.9 % IV BOLUS
1000.0000 mL | Freq: Once | INTRAVENOUS | Status: AC
  Administered 2018-07-15: 1000 mL via INTRAVENOUS

## 2018-07-15 MED ORDER — SUCRALFATE 1 G PO TABS
1.0000 g | ORAL_TABLET | Freq: Once | ORAL | Status: AC
  Administered 2018-07-15: 1 g via ORAL
  Filled 2018-07-15: qty 1

## 2018-07-15 MED ORDER — METOCLOPRAMIDE HCL 5 MG/ML IJ SOLN
10.0000 mg | Freq: Four times a day (QID) | INTRAMUSCULAR | Status: DC
  Administered 2018-07-15 – 2018-07-17 (×9): 10 mg via INTRAVENOUS
  Filled 2018-07-15 (×9): qty 2

## 2018-07-15 MED ORDER — PROMETHAZINE HCL 25 MG/ML IJ SOLN
25.0000 mg | Freq: Four times a day (QID) | INTRAMUSCULAR | Status: DC
  Administered 2018-07-15 – 2018-07-16 (×3): 25 mg via INTRAVENOUS
  Filled 2018-07-15 (×3): qty 1

## 2018-07-15 MED ORDER — SODIUM CHLORIDE 0.9 % IJ SOLN
INTRAMUSCULAR | Status: AC
  Administered 2018-07-15: 10 mL
  Filled 2018-07-15: qty 10

## 2018-07-15 MED ORDER — ACETAMINOPHEN 325 MG PO TABS: 650.0000 mg | ORAL_TABLET | ORAL | Status: DC | PRN

## 2018-07-15 MED ORDER — PRENATAL MULTIVITAMIN CH: 1.0000 | ORAL_TABLET | Freq: Every day | ORAL | Status: DC

## 2018-07-15 MED ORDER — ONDANSETRON HCL 4 MG/2ML IJ SOLN
4.0000 mg | Freq: Once | INTRAMUSCULAR | Status: AC
  Administered 2018-07-15: 4 mg via INTRAVENOUS
  Filled 2018-07-15: qty 2

## 2018-07-15 MED ORDER — PROMETHAZINE HCL 25 MG/ML IJ SOLN
12.5000 mg | Freq: Four times a day (QID) | INTRAMUSCULAR | Status: DC | PRN
  Administered 2018-07-15: 12.5 mg via INTRAVENOUS
  Filled 2018-07-15: qty 1

## 2018-07-15 MED ORDER — DEXTROSE 5 % AND 0.45 % NACL IV BOLUS
1000.0000 mL | Freq: Once | INTRAVENOUS | Status: AC
  Administered 2018-07-15: 1000 mL via INTRAVENOUS

## 2018-07-15 NOTE — ED Provider Notes (Signed)
[facility] Emergency Department Provider Note    First MD Initiated Contact with Patient 07/15/18 [phone]     (approximate)  I have reviewed the triage vital signs and the nursing notes.   HISTORY  Chief Complaint Hyperemesis Gravidarum    HPI LIZMARIE WITTERS is a 27 y.o. female G7P0050  For [redacted] weeks pregnant by LMP presents the ER for vaginal bleeding and spotting that started this morning as well as nausea and vomiting since Wednesday.  Does have a history of hyperemesis gravidarum.  Does have some abdominal soreness that she states is secondary to the vomiting.  Denies any shortness of breath or chest pain.  Denies any vaginal discharge.  Has only noted a little bit of spotting when she wipes.  Has also had some dysuria.  The past promethazine worked well for her hyperemesis.   Past Medical History:  Diagnosis Date  . Medical history non-contributory    Family History  Problem Relation Age of Onset  . Diabetes Mother   . Hyperlipidemia Mother   . Hypertension Mother   . Depression Mother   . Pancreatitis Mother   . Diabetes Father   . Breast cancer Maternal Aunt    Past Surgical History:  Procedure Laterality Date  . DILATION AND CURETTAGE OF UTERUS  2008, 2012   D&E EAB  . DILATION AND CURETTAGE OF UTERUS N/A 11/03/2016   Procedure: DILATATION AND CURETTAGE;  Surgeon: Nadara Mustard, MD;  Location: [facility] ORS;  Service: Gynecology;  Laterality: N/A;   Patient Active Problem List   Diagnosis Date Noted  . Labor and delivery, indication for care 11/02/2016  . Pregnancy, supervision, high-risk, third trimester 11/02/2016  . Gestational diabetes mellitus (GDM) in third trimester 11/02/2016  . Gestational hypertension, third trimester 11/02/2016  . [redacted] weeks gestation of pregnancy 11/02/2016  . Obesity complicating pregnancy, third trimester 11/02/2016  . BMI 37.0-37.9, adult 11/02/2016  . Gestational hypertension 11/02/2016      Prior to  Admission medications   Medication Sig Start Date End Date Taking? Authorizing Provider  acetaminophen (TYLENOL) 325 MG tablet Take 325 mg by mouth every 4 (four) hours as needed for mild pain or headache.    Yes [provider]    Allergies Patient has no known allergies.    Social History Social History   Tobacco Use  . Smoking status: Former Games developer  . Smokeless tobacco: Never Used  Substance Use Topics  . Alcohol use: No  . Drug use: Yes    Types: Marijuana    Review of Systems Patient denies headaches, rhinorrhea, blurry vision, numbness, shortness of breath, chest pain, edema, cough, abdominal pain, nausea, vomiting, diarrhea, dysuria, fevers, rashes or hallucinations unless otherwise stated above in HPI. ____________________________________________   PHYSICAL EXAM:  VITAL SIGNS: Vitals:   07/15/18 1115 07/15/18 1130  BP:  (!) 140/92  Pulse: 61 (!) 54  Resp:    Temp:    SpO2: 100% 100%    Constitutional: Alert and oriented.  Eyes: Conjunctivae are normal.  Head: Atraumatic. Nose: No congestion/rhinnorhea. Mouth/Throat: Mucous membranes are moist.   Neck: No stridor. Painless ROM.  Cardiovascular: Normal rate, regular rhythm. Grossly normal heart sounds.  Good peripheral circulation. Respiratory: Normal respiratory effort.  No retractions. Lungs CTAB. Gastrointestinal: Soft and nontender. No distention. No abdominal bruits. No CVA tenderness. Genitourinary:  Musculoskeletal: No lower extremity tenderness nor edema.  No joint effusions. Neurologic:  Normal speech and language. No gross focal neurologic deficits are appreciated.  No facial droop Skin:  Skin is warm, dry and intact. No rash noted. Psychiatric: Mood and affect are normal. Speech and behavior are normal.  ____________________________________________   LABS (all labs ordered are listed, but only abnormal results are displayed)  Results for orders placed or performed during the hospital  encounter of 07/15/18 (from the past 24 hour(s))  CBC with Differential/Platelet     Status: Abnormal   Collection Time: 07/15/18  9:24 AM  Result Value Ref Range   WBC 9.1 4.0 - 10.5 K/uL   RBC 4.44 3.87 - 5.11 MIL/uL   Hemoglobin 12.2 12.0 - 15.0 g/dL   HCT 16.1 (L) 09.6 - 04.5 %   MCV 76.6 (L) 80.0 - 100.0 fL   MCH 27.5 26.0 - 34.0 pg   MCHC 35.9 30.0 - 36.0 g/dL   RDW 40.9 81.1 - 91.4 %   Platelets 398 150 - 400 K/uL   nRBC 0.0 0.0 - 0.2 %   Neutrophils Relative % 63 %   Neutro Abs 5.8 1.7 - 7.7 K/uL   Lymphocytes Relative 32 %   Lymphs Abs 2.9 0.7 - 4.0 K/uL   Monocytes Relative 4 %   Monocytes Absolute 0.3 0.1 - 1.0 K/uL   Eosinophils Relative 0 %   Eosinophils Absolute 0.0 0.0 - 0.5 K/uL   Basophils Relative 0 %   Basophils Absolute 0.0 0.0 - 0.1 K/uL   Immature Granulocytes 1 %   Abs Immature Granulocytes 0.05 0.00 - 0.07 K/uL  Comprehensive metabolic panel     Status: Abnormal   Collection Time: 07/15/18  9:24 AM  Result Value Ref Range   Sodium 135 135 - 145 mmol/L   Potassium 3.0 (L) 3.5 - 5.1 mmol/L   Chloride 103 98 - 111 mmol/L   CO2 20 (L) 22 - 32 mmol/L   Glucose, Bld 168 (H) 70 - 99 mg/dL   BUN 16 6 - 20 mg/dL   Creatinine, Ser 7.82 0.44 - 1.00 mg/dL   Calcium 8.9 8.9 - 95.6 mg/dL   Total Protein 7.9 6.5 - 8.1 g/dL   Albumin 4.2 3.5 - 5.0 g/dL   AST 26 15 - 41 U/L   ALT 14 0 - 44 U/L   Alkaline Phosphatase 46 38 - 126 U/L   Total Bilirubin 0.8 0.3 - 1.2 mg/dL   GFR calc non Af Amer >60 >60 mL/min   GFR calc Af Amer >60 >60 mL/min   Anion gap 12 5 - 15  Urinalysis, Complete w Microscopic     Status: Abnormal   Collection Time: 07/15/18  9:25 AM  Result Value Ref Range   Color, Urine AMBER (A) YELLOW   APPearance CLEAR (A) CLEAR   Specific Gravity, Urine 1.032 (H) 1.005 - 1.030   pH 6.0 5.0 - 8.0   Glucose, UA NEGATIVE NEGATIVE mg/dL   Hgb urine dipstick NEGATIVE NEGATIVE   Bilirubin Urine NEGATIVE NEGATIVE   Ketones, ur 80 (A) NEGATIVE mg/dL    Protein, ur 213 (A) NEGATIVE mg/dL   Nitrite NEGATIVE NEGATIVE   Leukocytes, UA TRACE (A) NEGATIVE   RBC / HPF 0-5 0 - 5 RBC/hpf   WBC, UA 0-5 0 - 5 WBC/hpf   Bacteria, UA NONE SEEN NONE SEEN   Squamous Epithelial / LPF 0-5 0 - 5   Mucus PRESENT   hCG, quantitative, pregnancy     Status: Abnormal   Collection Time: 07/15/18  9:25 AM  Result Value Ref Range   hCG, Beta Chain,  Mahalia Longest 69,629 (H) <5 mIU/mL  ABO/Rh     Status: None   Collection Time: 07/15/18  9:25 AM  Result Value Ref Range   ABO/RH(D)      A POS Performed at [facility], [address] [place], [place] [address]    ____________________________________________ ____________________________________________  RADIOLOGY  I personally reviewed all radiographic images ordered to evaluate for the above acute complaints and reviewed radiology reports and findings.  These findings were personally discussed with the patient.  Please see medical record for radiology report.  ____________________________________________   PROCEDURES  Procedure(s) performed:  Procedures    Critical Care performed: no ____________________________________________   INITIAL IMPRESSION / ASSESSMENT AND PLAN / ED COURSE  Pertinent labs & imaging results that were available during my care of the patient were reviewed by me and considered in my medical decision making (see chart for details).   DDX: Dehydration, enteritis, ectopic, threatened AB, UTI, lecture light abnormality, dehydration, gastroenteritis, intractable nausea and vomiting, hyperemesis  MONSERATT LEDIN is a 27 y.o. who presents to the ED with symptoms as described above.  She is afebrile hemodynamically stable.  Her abdominal exam is benign at this time does not seem clinically consistent with appendicitis or biliary pathology.  Possible gastroenteritis.  Urinalysis does show evidence of very concentrated urine with ketones.  Will be given IV fluids as well as D5  normal saline to help reverse ketosis.  We will give IV antiemetics.  Ultrasound ordered to evaluate for IUP given her vaginal spotting.  Doubt ectopic.  The patient will be placed on continuous pulse oximetry and telemetry for monitoring.  Laboratory evaluation will be sent to evaluate for the above complaints.     Clinical Course as of Jul 15 1518  Sat Jul 15, 2018  1447 Patient not tolerating p.o.  Will give additional bolus of IV fluids.  Will also check chest x-ray to evaluate for aspiration.  Repeat abdominal exam is soft and benign.   [PR]  1516 Discussed case with CNM McVey who has kindly agreed to evaluate patient for admission for IV fluids additional symptomatic management as the patient still having nausea vomiting.   [PR]    Clinical Course User Index [PR] Willy Eddy, MD     As part of my medical decision making, I reviewed the following data within the electronic MEDICAL RECORD NUMBER Nursing notes reviewed and incorporated, Labs reviewed, notes from prior ED visits.   ____________________________________________   FINAL CLINICAL IMPRESSION(S) / ED DIAGNOSES  Final diagnoses:  Intractable nausea and vomiting  [redacted] weeks gestation of pregnancy      NEW MEDICATIONS STARTED DURING THIS VISIT:  New Prescriptions   No medications on file     Note:  This document was prepared using Dragon voice recognition software and may include unintentional dictation errors.    Willy Eddy, MD 07/15/18 [phone]

## 2018-07-15 NOTE — H&P (Signed)
Consult History and Physical   SERVICE: Obstetrics  Patient Name: Sierra Jordan Patient MRN:   [id number]  CC: Intractable Nausea and vomiting  HPI: Sierra Jordan is a 27 y.o. [id number] admitted via ER today for worsening N/V since Weds and vaginal spotting this am when wiping only. Pt states unable to keep down any fluids or solids since Weds.   blood type: A Pos  Review of Systems: positives in bold GEN:   fevers, chills, weight changes, appetite changes, fatigue, night sweats HEENT:  HA, vision changes, hearing loss, congestion, rhinorrhea, sinus pressure, dysphagia CV:   CP, palpitations PULM:  SOB, cough GI:  abd pain, N/V+ denies diarrhea/constipation.  GU:  dysuria, urgency, frequency, denies vaginal bleeding currently.  MSK:  arthralgias, myalgias, back pain, swelling SKIN:  rashes, color changes, pallor NEURO:  numbness, weakness, tingling, seizures, dizziness, tremors PSYCH:  depression, anxiety, behavioral problems, confusion  HEME/LYMPH:  easy bruising or bleeding ENDO:  heat/cold intolerance  Past Obstetrical History: OB History    Gravida  7   Para  1   Term  1   Preterm  0   AB  5   Living  1     SAB  3   TAB  0   Ectopic  0   Multiple  0   Live Births  1         10/2016- SVD at term, preg c/b Hyperemesis, GDM and GHTN; Delayed PPH with D&C and Hgb 6.8 post-op.   Past Gynecologic History: No LMP recorded. Patient is pregnant.   Past Medical History: Past Medical History:  Diagnosis Date  . Medical history non-contributory    Hx MJ use  hx GDM with last pregnancy- no f/u noted.   Past Surgical History:   Past Surgical History:  Procedure Laterality Date  . DILATION AND CURETTAGE OF UTERUS  2008, 2012   D&E EAB  . DILATION AND CURETTAGE OF UTERUS N/A 11/03/2016   Procedure: DILATATION AND CURETTAGE;  Surgeon: Nadara Mustard, MD;  Location: [facility] ORS;  Service: Gynecology;  Laterality: N/A;    Family History:  family history  includes Breast cancer in her maternal aunt; Depression in her mother; Diabetes in her father and mother; Hyperlipidemia in her mother; Hypertension in her mother; Pancreatitis in her mother.  Social History:  Social History   Socioeconomic History  . Marital status: Single    Spouse name: Not on file  . Number of children: Not on file  . Years of education: Not on file  . Highest education level: Not on file  Occupational History  . Not on file  Social Needs  . Financial resource strain: Not on file  . Food insecurity:    Worry: Not on file    Inability: Not on file  . Transportation needs:    Medical: Not on file    Non-medical: Not on file  Tobacco Use  . Smoking status: Former Games developer  . Smokeless tobacco: Never Used  Substance and Sexual Activity  . Alcohol use: No  . Drug use: Yes    Types: Marijuana  . Sexual activity: Yes  Lifestyle  . Physical activity:    Days per week: Not on file    Minutes per session: Not on file  . Stress: Not on file  Relationships  . Social connections:    Talks on phone: Not on file    Gets together: Not on file    Attends religious service: Not on  file    Active member of club or organization: Not on file    Attends meetings of clubs or organizations: Not on file    Relationship status: Not on file  . Intimate partner violence:    Fear of current or ex partner: Not on file    Emotionally abused: Not on file    Physically abused: Not on file    Forced sexual activity: Not on file  Other Topics Concern  . Not on file  Social History Narrative  . Not on file    Home Medications:  Medications reconciled in EPIC  No current facility-administered medications on file prior to encounter.    Current Outpatient Medications on File Prior to Encounter  Medication Sig Dispense Refill  . acetaminophen (TYLENOL) 325 MG tablet Take 325 mg by mouth every 4 (four) hours as needed for mild pain or headache.       Allergies:  No Known  Allergies  Physical Exam:  Temp:  [97.4 F (36.3 C)-98.1 F (36.7 C)] 98.1 F (36.7 C) (10/19 1649) Pulse Rate:  [54-94] 60 (10/19 1649) Resp:  [18-28] 18 (10/19 1649) BP: (116-140)/(74-92) 116/74 (10/19 1649) SpO2:  [100 %] 100 % (10/19 1649) Weight:  [99.8 kg] 99.8 kg (10/19 0907)   General Appearance:  Well developed, well nourished, no acute distress, alert and oriented x3; Pt arouses to stimulation, received phenergan at 1707 HEENT:  Normocephalic atraumatic, extraocular movements intact, moist mucous membranes Cardiovascular:  Normal S1/S2, regular rate and rhythm, no murmurs Pulmonary:  clear to auscultation, no wheezes, rales or rhonchi, symmetric air entry, good air exchange Abdomen:  Bowel sounds present, soft, nontender, nondistended, no abnormal masses, no epigastric pain Extremities:  Full range of motion, no pedal edema, 2+ distal pulses, no tenderness Skin:  normal coloration and turgor, no rashes, no suspicious skin lesions noted  Neurologic:  normal muscle tone, strength 5/5 all four extremities Psychiatric:  Normal mood and affect, appropriate, no AH/VH Pelvic:  Deferred    Labs/Studies:   CBC and Coags:  Lab Results  Component Value Date   WBC 9.1 07/15/2018   NEUTOPHILPCT 63 07/15/2018   EOSPCT 0 07/15/2018   BASOPCT 0 07/15/2018   LYMPHOPCT 32 07/15/2018   HGB 12.2 07/15/2018   HCT 34.0 (L) 07/15/2018   MCV 76.6 (L) 07/15/2018   PLT 398 07/15/2018   CMP:  Lab Results  Component Value Date   NA 135 07/15/2018   K 3.0 (L) 07/15/2018   CL 103 07/15/2018   CO2 20 (L) 07/15/2018   BUN 16 07/15/2018   CREATININE 0.72 07/15/2018   CREATININE 0.65 05/29/2017   CREATININE 0.48 11/02/2016   PROT 7.9 07/15/2018   BILITOT 0.8 07/15/2018   ALT 14 07/15/2018   AST 26 07/15/2018   ALKPHOS 46 07/15/2018   Other Labs: Lipase: 24 UA: sp grav 1.032; + ketones, +proteinuria CMP: glucose 168  Other Imaging: Dg Chest Portable 1 View  Result Date:  07/15/2018 CLINICAL DATA:  27 year old female with a history of vomiting for 3 days EXAM: PORTABLE CHEST 1 VIEW COMPARISON:  07/15/2018 FINDINGS: Cardiomediastinal silhouette unchanged in size and contour. No evidence of central vascular congestion. No pneumothorax or pleural effusion. No confluent airspace disease. IMPRESSION: Negative for acute cardiopulmonary disease Electronically Signed   By: Gilmer Mor D.O.   On: 07/15/2018 15:04   US Ob Less Than 14 Weeks With Ob Transvaginal  Result Date: 07/15/2018 CLINICAL DATA:  Vaginal bleeding.  Pelvic pain. EXAM: OBSTETRIC <14  WK [place] AND TRANSVAGINAL OB US TECHNIQUE: Transvaginal ultrasound was performed for complete evaluation of the gestation as well as the maternal uterus, adnexal regions, and pelvic cul-de-sac. COMPARISON:  None FINDINGS: Intrauterine gestational sac: Single Yolk sac:  Visualized Embryo:  Visualized Cardiac Activity: Visualized Heart Rate: 185 bpm CRL:   22.1 mm   8 w 6 d                  [place] EDC: 02/17/2018 Subchorionic hemorrhage:  None visualized. Maternal uterus/adnexae: Right ovary: Normal Left ovary: Not visualized Other :None Free fluid:  None IMPRESSION: 1. Single living intrauterine gestation with an estimated gestational age of [redacted] weeks and 6 days. 2. No complications. Electronically Signed   By: Signa Kell M.D.   On: 07/15/2018 11:43     Assessment / Plan:   Sierra Jordan is a 27 y.o. [id number] at [redacted]w[redacted]d who presents with N/V and noted to have Hyperemesis gravidarum with dehydration and electrolyte imbalance.   1. Admit to Ob service for [facility] as pt plans to continue pregnancy care with [place].  - consulted Dr Elesa Massed, agrees with below POC.  - Admit to antepartum bed on 3rd floor. - Viable SIUP confirmed with TVUS today, c/w LMP dating. EDC: 02/17/2019  2. Hypokalemia and dehydration  - banana bag IV now with KCL added - repeat CMP after banana bag complete, plan to continue IV fluids with IV KCL replacement if needed.  -  elevated glucose on CMP, will monitor with next CMP then consider CBGs, HgbA1C added onto previous lab draw.   3. Hyperemesis Gravidarum - Scheduled IV Phenergan and Reglan to start - may need steroid taper or Zofran if needed, plan to consult MD if these are needed.  - Clear liquid diet and advance as tolerated.   McVey, REBECCA A, CNM 07/15/2018 6:01 PM

## 2018-07-15 NOTE — ED Notes (Signed)
Pt taken to ultrasound

## 2018-07-15 NOTE — ED Triage Notes (Signed)
Pt here for vomiting since Wednesday. Unable to keep anything down. Pt hyperventilating in triage.  G7P1. Denies pain just sore from vomiting.  Started having some vaginal bleeding today per pt. Is 10 weeks.

## 2018-07-15 NOTE — ED Notes (Signed)
Lavender top collected and sent to lab for A1C

## 2018-07-16 LAB — COMPREHENSIVE METABOLIC PANEL
ALT: 25 U/L (ref 0–44)
AST: 25 U/L (ref 15–41)
Albumin: 3.5 g/dL (ref 3.5–5.0)
Alkaline Phosphatase: 44 U/L (ref 38–126)
Anion gap: 8 (ref 5–15)
BUN: 5 mg/dL — ABNORMAL LOW (ref 6–20)
CO2: 22 mmol/L (ref 22–32)
Calcium: 8.6 mg/dL — ABNORMAL LOW (ref 8.9–10.3)
Chloride: 105 mmol/L (ref 98–111)
Creatinine, Ser: 0.46 mg/dL (ref 0.44–1.00)
GFR calc Af Amer: >60 mL/min (ref >60)
GFR calc non Af Amer: >60 mL/min (ref >60)
Glucose, Bld: 83 mg/dL (ref 70–99)
Potassium: 3.2 mmol/L — ABNORMAL LOW (ref 3.5–5.1)
Sodium: 135 mmol/L (ref 135–145)
Total Bilirubin: 1.1 mg/dL (ref 0.3–1.2)
Total Protein: 6.8 g/dL (ref 6.5–8.1)

## 2018-07-16 LAB — HEMOGLOBIN A1C
Hgb A1c MFr Bld: 5.5 % (ref 4.8–5.6)
Mean Plasma Glucose: 111.15 mg/dL

## 2018-07-16 MED ORDER — SODIUM CHLORIDE 0.9 % IJ SOLN
INTRAMUSCULAR | Status: AC
  Administered 2018-07-16: 10 mL
  Filled 2018-07-16: qty 10

## 2018-07-16 MED ORDER — VITAMIN B-6 50 MG PO TABS
25.0000 mg | ORAL_TABLET | Freq: Three times a day (TID) | ORAL | Status: DC
  Administered 2018-07-16 – 2018-07-17 (×5): 25 mg via ORAL
  Filled 2018-07-16 (×6): qty 0.5

## 2018-07-16 MED ORDER — SODIUM CHLORIDE 0.9 % IJ SOLN
INTRAMUSCULAR | Status: AC
  Administered 2018-07-16: 19:00:00
  Filled 2018-07-16: qty 10

## 2018-07-16 MED ORDER — PROMETHAZINE HCL 25 MG/ML IJ SOLN
12.5000 mg | INTRAMUSCULAR | Status: DC
  Administered 2018-07-16 – 2018-07-17 (×6): 12.5 mg via INTRAVENOUS
  Filled 2018-07-16 (×6): qty 1

## 2018-07-16 MED ORDER — DOXYLAMINE SUCCINATE (SLEEP) 25 MG PO TABS
12.5000 mg | ORAL_TABLET | Freq: Three times a day (TID) | ORAL | Status: DC
  Administered 2018-07-16 – 2018-07-17 (×5): 12.5 mg via ORAL
  Filled 2018-07-16 (×6): qty 1

## 2018-07-16 NOTE — Progress Notes (Addendum)
Obstetric and Gynecology  HD # 2  Subjective  Patient doing fair,  ambulating without difficulty, voiding spontaneously. Is able to sip clear liquids and ice chips without vomiting for about 3.5 hrs after phenergan dose, but starts with dry heaves at that point. Reports very sleepy with phenergan for 1st 3hrs after dose. Feels hungry but afraid to eat anything. Reports minimal weight loss so far this pregnancy, starting weight about 220lbs.  Denies CP, SOB, F/C, N/V/D, or leg pain.   Objective  Objective:   Vitals:   07/15/18 2003 07/16/18 0008 07/16/18 0421 07/16/18 0722  BP: 128/80 (!) 107/52 133/71 116/82  Pulse: (!) 54 72 (!) 58 62  Resp: 18 18 18 20   Temp: 97.8 F (36.6 C) 98.7 F (37.1 C) 99.2 F (37.3 C) 98.3 F (36.8 C)  TempSrc: Oral Oral Oral Oral  SpO2: 100% 96% 95% 100%  Weight:      Height:       Temp:  [97.4 F (36.3 C)-99.2 F (37.3 C)] 98.3 F (36.8 C) (10/20 0722) Pulse Rate:  [54-94] 62 (10/20 0722) Resp:  [18-28] 20 (10/20 0722) BP: (107-140)/(52-92) 116/82 (10/20 0722) SpO2:  [95 %-100 %] 100 % (10/20 0722) Weight:  [99.8 kg] 99.8 kg (10/19 0907) I/O last 3 completed shifts: In: 1348.1 [I.V.:1348.1] Out: 1100 [Urine:1100] No intake/output data recorded.  Intake/Output Summary (Last 24 hours) at 07/16/2018 0829 Last data filed at 07/16/2018 0511 Gross per 24 hour  Intake 1348.11 ml  Output 1100 ml  Net 248.11 ml     Current Vital Signs 24h Vital Sign Ranges  T 98.3 F (36.8 C) Temp  Avg: 98.3 F (36.8 C)  Min: 97.4 F (36.3 C)  Max: 99.2 F (37.3 C)  BP 116/82 BP  Min: 107/52  Max: 140/92  HR 62 Pulse  Avg: 63.1  Min: 54  Max: 94  RR 20 Resp  Avg: 20  Min: 18  Max: 28  SaO2 100 % Room Air SpO2  Avg: 99.1 %  Min: 95 %  Max: 100 %           24 Hour I/O Current Shift I/O  Time Ins Outs 10/19 0701 - 10/20 0700 In: 1348.1 [I.V.:1348.1] Out: 1100 [Urine:1100] No intake/output data recorded.   General: NAD but unwell feeling.   Cardiovascular: RRR, no murmurs Pulmonary: CTAB, normal respiratory effort Abdomen: Benign. Non-tender, +BS, no guarding.  Extremities: No erythema or cords, no calf tenderness, with normal peripheral pulses.  Labs: Results for orders placed or performed during the hospital encounter of 07/15/18 (from the past 24 hour(s))  CBC with Differential/Platelet     Status: Abnormal   Collection Time: 07/15/18  9:24 AM  Result Value Ref Range   WBC 9.1 4.0 - 10.5 K/uL   RBC 4.44 3.87 - 5.11 MIL/uL   Hemoglobin 12.2 12.0 - 15.0 g/dL   HCT 40.9 (L) 81.1 - 91.4 %   MCV 76.6 (L) 80.0 - 100.0 fL   MCH 27.5 26.0 - 34.0 pg   MCHC 35.9 30.0 - 36.0 g/dL   RDW 78.2 95.6 - 21.3 %   Platelets 398 150 - 400 K/uL   nRBC 0.0 0.0 - 0.2 %   Neutrophils Relative % 63 %   Neutro Abs 5.8 1.7 - 7.7 K/uL   Lymphocytes Relative 32 %   Lymphs Abs 2.9 0.7 - 4.0 K/uL   Monocytes Relative 4 %   Monocytes Absolute 0.3 0.1 - 1.0 K/uL   Eosinophils Relative 0 %  Eosinophils Absolute 0.0 0.0 - 0.5 K/uL   Basophils Relative 0 %   Basophils Absolute 0.0 0.0 - 0.1 K/uL   Immature Granulocytes 1 %   Abs Immature Granulocytes 0.05 0.00 - 0.07 K/uL  Comprehensive metabolic panel     Status: Abnormal   Collection Time: 07/15/18  9:24 AM  Result Value Ref Range   Sodium 135 135 - 145 mmol/L   Potassium 3.0 (L) 3.5 - 5.1 mmol/L   Chloride 103 98 - 111 mmol/L   CO2 20 (L) 22 - 32 mmol/L   Glucose, Bld 168 (H) 70 - 99 mg/dL   BUN 16 6 - 20 mg/dL   Creatinine, Ser 1.61 0.44 - 1.00 mg/dL   Calcium 8.9 8.9 - 09.6 mg/dL   Total Protein 7.9 6.5 - 8.1 g/dL   Albumin 4.2 3.5 - 5.0 g/dL   AST 26 15 - 41 U/L   ALT 14 0 - 44 U/L   Alkaline Phosphatase 46 38 - 126 U/L   Total Bilirubin 0.8 0.3 - 1.2 mg/dL   GFR calc non Af Amer >60 >60 mL/min   GFR calc Af Amer >60 >60 mL/min   Anion gap 12 5 - 15  Urinalysis, Complete w Microscopic     Status: Abnormal   Collection Time: 07/15/18  9:25 AM  Result Value Ref Range    Color, Urine AMBER (A) YELLOW   APPearance CLEAR (A) CLEAR   Specific Gravity, Urine 1.032 (H) 1.005 - 1.030   pH 6.0 5.0 - 8.0   Glucose, UA NEGATIVE NEGATIVE mg/dL   Hgb urine dipstick NEGATIVE NEGATIVE   Bilirubin Urine NEGATIVE NEGATIVE   Ketones, ur 80 (A) NEGATIVE mg/dL   Protein, ur 045 (A) NEGATIVE mg/dL   Nitrite NEGATIVE NEGATIVE   Leukocytes, UA TRACE (A) NEGATIVE   RBC / HPF 0-5 0 - 5 RBC/hpf   WBC, UA 0-5 0 - 5 WBC/hpf   Bacteria, UA NONE SEEN NONE SEEN   Squamous Epithelial / LPF 0-5 0 - 5   Mucus PRESENT   hCG, quantitative, pregnancy     Status: Abnormal   Collection Time: 07/15/18  9:25 AM  Result Value Ref Range   hCG, Beta Chain, Quant, S 40,981 (H) <5 mIU/mL  ABO/Rh     Status: None   Collection Time: 07/15/18  9:25 AM  Result Value Ref Range   ABO/RH(D)      A POS Performed at [facility], [address] [place], [place] [address]   Lipase, blood     Status: None   Collection Time: 07/15/18  2:49 PM  Result Value Ref Range   Lipase 24 11 - 51 U/L  Hemoglobin A1c     Status: None   Collection Time: 07/15/18  4:20 PM  Result Value Ref Range   Hgb A1c MFr Bld 5.5 4.8 - 5.6 %   Mean Plasma Glucose 111.15 mg/dL  Comprehensive metabolic panel     Status: Abnormal   Collection Time: 07/15/18 10:10 PM  Result Value Ref Range   Sodium 137 135 - 145 mmol/L   Potassium 3.5 3.5 - 5.1 mmol/L   Chloride 110 98 - 111 mmol/L   CO2 21 (L) 22 - 32 mmol/L   Glucose, Bld 100 (H) 70 - 99 mg/dL   BUN 9 6 - 20 mg/dL   Creatinine, Ser 8.29 0.44 - 1.00 mg/dL   Calcium 7.8 (L) 8.9 - 10.3 mg/dL   Total Protein 6.6 6.5 - 8.1  g/dL   Albumin 3.4 (L) 3.5 - 5.0 g/dL   AST 18 15 - 41 U/L   ALT 15 0 - 44 U/L   Alkaline Phosphatase 39 38 - 126 U/L   Total Bilirubin 0.8 0.3 - 1.2 mg/dL   GFR calc non Af Amer >60 >60 mL/min   GFR calc Af Amer >60 >60 mL/min   Anion gap 6 5 - 15    Cultures: Results for orders placed or performed during the hospital encounter of  11/02/16  OB RESULT CONSOLE Group B Strep     Status: None   Collection Time: 10/05/16 12:00 AM  Result Value Ref Range Status   GBS Negative  Final    Imaging: Dg Chest Portable 1 View  Result Date: 07/15/2018 CLINICAL DATA:  27 year old female with a history of vomiting for 3 days EXAM: PORTABLE CHEST 1 VIEW COMPARISON:  07/15/2018 FINDINGS: Cardiomediastinal silhouette unchanged in size and contour. No evidence of central vascular congestion. No pneumothorax or pleural effusion. No confluent airspace disease. IMPRESSION: Negative for acute cardiopulmonary disease Electronically Signed   By: Gilmer Mor D.O.   On: 07/15/2018 15:04   US Ob Less Than 14 Weeks With Ob Transvaginal  Result Date: 07/15/2018 CLINICAL DATA:  Vaginal bleeding.  Pelvic pain. EXAM: OBSTETRIC <14 WK [place] AND TRANSVAGINAL OB US TECHNIQUE: Transvaginal ultrasound was performed for complete evaluation of the gestation as well as the maternal uterus, adnexal regions, and pelvic cul-de-sac. COMPARISON:  None FINDINGS: Intrauterine gestational sac: Single Yolk sac:  Visualized Embryo:  Visualized Cardiac Activity: Visualized Heart Rate: 185 bpm CRL:   22.1 mm   8 w 6 d                  [place] EDC: 02/17/2018 Subchorionic hemorrhage:  None visualized. Maternal uterus/adnexae: Right ovary: Normal Left ovary: Not visualized Other :None Free fluid:  None IMPRESSION: 1. Single living intrauterine gestation with an estimated gestational age of [redacted] weeks and 6 days. 2. No complications. Electronically Signed   By: Signa Kell M.D.   On: 07/15/2018 11:43     Assessment   27 y.o. [id number] at [redacted]w[redacted]d with hyperemesis gravidarum on  Hospital Day: 2   Plan   1. Continue to advance diet as tolerated, reviewed importance of scheduled anti-emetics, added on Vitamin B6 and Unasom; change Phenergan to 12.5mg  IV q4hrs. Pt states feeling slightly better.  2. continue IV fluids with KCL, pt not taking adequate PO intake and potassium is low normal  at 3.5   McVey, REBECCA A, CNM 07/16/2018 8:34 AM

## 2018-07-17 LAB — URINE CULTURE
Culture: NO GROWTH
Special Requests: NORMAL

## 2018-07-17 MED ORDER — PRENATAL MULTIVITAMIN CH: 1.0000 | ORAL_TABLET | Freq: Every day | ORAL | 6 refills | Status: DC

## 2018-07-17 MED ORDER — PROMETHAZINE HCL 25 MG PO TABS
25.0000 mg | ORAL_TABLET | Freq: Four times a day (QID) | ORAL | Status: DC
  Administered 2018-07-17 (×2): 25 mg via ORAL
  Filled 2018-07-17 (×4): qty 1

## 2018-07-17 MED ORDER — PROMETHAZINE HCL 25 MG PO TABS: 25.0000 mg | ORAL_TABLET | Freq: Four times a day (QID) | ORAL | 0 refills | Status: DC

## 2018-07-17 MED ORDER — METOCLOPRAMIDE HCL 10 MG PO TABS: 10.0000 mg | ORAL_TABLET | Freq: Two times a day (BID) | ORAL | 2 refills | Status: DC | PRN

## 2018-07-17 MED ORDER — DOXYLAMINE SUCCINATE (SLEEP) 25 MG PO TABS: 12.5000 mg | ORAL_TABLET | Freq: Three times a day (TID) | ORAL | 0 refills | Status: DC

## 2018-07-17 MED ORDER — PYRIDOXINE HCL 25 MG PO TABS: 25.0000 mg | ORAL_TABLET | Freq: Three times a day (TID) | ORAL | 3 refills | Status: DC

## 2018-07-17 NOTE — Discharge Summary (Signed)
Obstetrical Discharge Summary  Patient Name: Sierra Jordan DOB: 1990/10/06 MRN: [id number]  Date of Admission: 07/15/2018 Date of Discharge: 07/17/2018  Primary OB: Rexene Edison LMP:No LMP recorded. Patient is pregnant. EDC Estimated Date of Delivery: 02/17/19 Gestational Age at Delivery: [redacted]w[redacted]d    Admitting Diagnosis: hyperemesis   Brief hospital course: Pt admitted with intractable n/v. NPO, iv meds, and monitoring.She improved to po meds only. Pregnancy confirmed by u/s and gave due date. By time of d/c she was stable and nausea controlled.  Patient Active Problem List   Diagnosis Date Noted  . Labor and delivery, indication for care 11/02/2016  . Pregnancy, supervision, high-risk, third trimester 11/02/2016  . Gestational diabetes mellitus (GDM) in third trimester 11/02/2016  . Gestational hypertension, third trimester 11/02/2016  . [redacted] weeks gestation of pregnancy 11/02/2016  . Obesity complicating pregnancy, third trimester 11/02/2016  . BMI 37.0-37.9, adult 11/02/2016  . Gestational hypertension 11/02/2016  . Hyperemesis gravidarum with dehydration 04/22/2016   Discharge Physical Exam:  BP 123/73 (BP Location: Right Arm)   Pulse 66   Temp 98.5 F (36.9 C) (Oral)   Resp 18   Ht 5\' 3"  (1.6 m)   Wt 99.3 kg   SpO2 97%   BMI 38.79 kg/m   General: NAD CV: RRR Pulm: CTABL, nl effort ABD: s/nd/nt, fundus firm and below the umbilicus  DVT Evaluation: LE non-ttp, no evidence of DVT on exam.  Hemoglobin  Date Value Ref Range Status  07/15/2018 12.2 12.0 - 15.0 g/dL Final   HGB  Date Value Ref Range Status  08/03/2014 12.6 12.0 - 16.0 g/dL Final   HCT  Date Value Ref Range Status  07/15/2018 34.0 (L) 36.0 - 46.0 % Final  08/03/2014 36.6 35.0 - 47.0 % Final      Plan:  Celestia Khat was discharged to home in good condition. Follow-up appointment with delivering provider in 6 weeks.  Discharge Medications: Allergies as of 07/17/2018   No Known  Allergies     Medication List    TAKE these medications   acetaminophen 325 MG tablet Commonly known as:  TYLENOL Take 325 mg by mouth every 4 (four) hours as needed for mild pain or headache.   doxylamine (Sleep) 25 MG tablet Commonly known as:  UNISOM Take 0.5 tablets (12.5 mg total) by mouth 3 (three) times daily.   metoCLOPramide 10 MG tablet Commonly known as:  REGLAN Take 1 tablet (10 mg total) by mouth 2 (two) times daily as needed for nausea or vomiting.   prenatal multivitamin Tabs tablet Take 1 tablet by mouth daily at 12 noon. Start taking on:  07/18/2018   promethazine 25 MG tablet Commonly known as:  PHENERGAN Take 1 tablet (25 mg total) by mouth every 6 (six) hours. Start taking on:  07/18/2018   pyridOXINE 25 MG tablet Commonly known as:  VITAMIN B-6 Take 1 tablet (25 mg total) by mouth 3 (three) times daily.         Signed: Christeen Douglas 07/17/18

## 2018-07-17 NOTE — Discharge Instructions (Signed)

## 2018-07-17 NOTE — Discharge Summary (Signed)
Dc reviewed with pt. No follow up at this time unless pt starts experiencing problems. Prescription for PNV received on paper. Other prescriptions called to pharmacy. Pt verbalizes understanding of all dc instructions. Dc to home via wc by NT.

## 2018-07-17 NOTE — Progress Notes (Signed)
Asked MD if we could switch to po because pt stated she was feeling much better and what appears to have been tolerating po's. Pt never told me she wasn't tolerating her liquids. She has requested multiple ice and popsicles this shift. When informed pt we could try po phenergan she states she has not been tolerating po's, that she had vomited 30 mins ago and she wanted IV phenergan. Will continue IV phenergan per pt request. I encouraged pt to let me know when she wasn't feeling well and esp when she vomited.

## 2018-10-10 ENCOUNTER — Encounter: Payer: Self-pay | Admitting: Advanced Practice Midwife

## 2018-10-12 ENCOUNTER — Ambulatory Visit (INDEPENDENT_AMBULATORY_CARE_PROVIDER_SITE_OTHER): Payer: Medicaid Other | Admitting: Advanced Practice Midwife

## 2018-10-12 ENCOUNTER — Encounter: Payer: Self-pay | Admitting: Advanced Practice Midwife

## 2018-10-12 ENCOUNTER — Other Ambulatory Visit (HOSPITAL_COMMUNITY)
Admission: RE | Admit: 2018-10-12 | Discharge: 2018-10-12 | Disposition: A | Discharge status: Home / Self Care | Payer: Medicaid Other | Source: Ambulatory Visit | Attending: Advanced Practice Midwife | Admitting: Advanced Practice Midwife

## 2018-10-12 VITALS — BP 124/74 | Wt 234.0 lb

## 2018-10-12 DIAGNOSIS — Z113 Encounter for screening for infections with a predominantly sexual mode of transmission: Secondary | ICD-10-CM

## 2018-10-12 DIAGNOSIS — O0990 Supervision of high risk pregnancy, unspecified, unspecified trimester: Secondary | ICD-10-CM | POA: Diagnosis present

## 2018-10-12 DIAGNOSIS — Z3A22 22 weeks gestation of pregnancy: Secondary | ICD-10-CM | POA: Diagnosis not present

## 2018-10-12 DIAGNOSIS — Z131 Encounter for screening for diabetes mellitus: Secondary | ICD-10-CM

## 2018-10-12 DIAGNOSIS — O0992 Supervision of high risk pregnancy, unspecified, second trimester: Secondary | ICD-10-CM | POA: Diagnosis not present

## 2018-10-12 DIAGNOSIS — O99212 Obesity complicating pregnancy, second trimester: Secondary | ICD-10-CM | POA: Diagnosis not present

## 2018-10-12 DIAGNOSIS — O99210 Obesity complicating pregnancy, unspecified trimester: Secondary | ICD-10-CM

## 2018-10-12 NOTE — Patient Instructions (Signed)
Exercise During Pregnancy For people of all ages, exercise is an important part of being healthy. Exercise improves heart and lung function and helps to maintain strength, flexibility, and a healthy body weight. Exercise also boosts energy levels and elevates mood. For most women, maintaining an exercise routine throughout pregnancy is recommended. It is only on rare occasions and with certain medical conditions or pregnancy complications that women may be asked to limit or avoid exercise during pregnancy. What are some other benefits to exercising during pregnancy? Along with maintaining strength and flexibility, exercising throughout pregnancy can help to:  Keep strength in muscles that are very important during labor and childbirth.  Decrease low back pain during pregnancy.  Decrease the risk of developing gestational diabetes mellitus (GDM).  Improve blood sugar (glucose) control for women who have GDM.  Decrease the risk of developing preeclampsia. This is a serious condition that causes high blood pressure along with other symptoms, such as swelling and headaches.  Decrease the risk of cesarean delivery.  Speed up the recovery after giving birth. How often should I exercise? Unless your health care provider gives you different instructions, you should try to exercise on most days or all days of the week. In general, try to exercise with moderate intensity for about 150 minutes per week. This can be spread out across several days, such as exercising for 30 minutes per day on 5 days of each week. You can tell that you are exercising at a moderate intensity if you have a higher heart rate and faster breathing, but you are still able to hold a conversation. What types of moderate-intensity exercise are recommended during pregnancy? There are many types of exercise that are safe for you to do during pregnancy. Unless your health care provider gives you different instructions, do a variety of  exercises that safely increase your heart and breathing (cardiopulmonary) rates and help you to build and maintain muscle strength (strength training). You should always be able to talk in full sentences while exercising during pregnancy. Some examples of exercising that is safe to do during pregnancy include:  Brisk walking or hiking.  Swimming.  Water aerobics.  Riding a stationary bike.  Strength training.  Modified yoga or Pilates. Tell your instructor that you are pregnant. Avoid overstretching and avoid lying on your back for long periods of time.  Running or jogging. Only choose this type of exercise if: ? You ran or jogged regularly before your pregnancy. ? You can run or jog and still talk in complete sentences. What types of exercise should I not do during pregnancy? Depending on your level of fitness and whether you exercised regularly before your pregnancy, you may be advised to limit vigorous-intensity exercise during your pregnancy. You can tell that you are exercising at a vigorous intensity if you are breathing much harder and faster and cannot hold a conversation while exercising. Some examples of exercising that you should avoid during pregnancy include:  Contact sports.  Activities that place you at risk for falling on or being hit in the belly, such as downhill skiing, water skiing, surfing, rock climbing, cycling, gymnastics, and horseback riding.  Scuba diving.  Sky diving.  Yoga or Pilates in a room that is heated to extreme temperatures ("hot yoga" or "hot Pilates").  Jogging or running, unless you ran or jogged regularly before your pregnancy. While jogging or running, you should always be able to talk in full sentences. Do not run or jog so vigorously that you   are unable to have a conversation.  If you are not used to exercising at elevation (more than 6,000 feet above sea level), do not do so during your pregnancy. When should I avoid exercising during  pregnancy? Certain medical conditions can make it unsafe to exercise during pregnancy, or they may increase your risk of miscarriage or early labor and birth. Some of these conditions include:  Some types of heart disease.  Some types of lung disease.  Placenta previa. This is when the placenta partially or completely covers the opening of the uterus (cervix).  Frequent bleeding from the vagina during your pregnancy.  Incompetent cervix. This is when your cervix does not remain as tightly closed during pregnancy as it should.  Premature labor.  Ruptured membranes. This is when the protective sac (amniotic sac) opens up and amniotic fluid leaks from your vagina.  Severely low blood count (anemia).  Preeclampsia or pregnancy-caused high blood pressure.  Carrying more than one baby (multiple gestation) and having an additional risk of early labor.  Poorly controlled diabetes.  Being severely underweight or severely overweight.  Intrauterine growth restriction. This is when your baby's growth and development during pregnancy are slower than expected.  Other medical conditions. Ask your health care provider if any apply to you. What else should I know about exercising during pregnancy? You should take these precautions while exercising during pregnancy:  Avoid overheating. ? Wear loose-fitting, breathable clothes. ? Do not exercise in very high temperatures.  Avoid dehydration. Drink enough water before, during, and after exercise to keep your urine clear or pale yellow.  Avoid overstretching. Because of hormone changes during pregnancy, it is easy to overstretch muscles, tendons, and ligaments during pregnancy.  Start slowly and ask your health care provider to recommend types of exercise that are safe for you, if exercising regularly is new for you. Pregnancy is not a time for exercising to lose weight. When should I seek medical care? You should stop exercising and call your  health care provider if you have any unusual symptoms, such as:  Mild uterine contractions or abdominal cramping.  Dizziness that does not improve with rest. When should I seek immediate medical care? You should stop exercising and call your local emergency services (911 in the [place] if you have any unusual symptoms, such as:  Sudden, severe pain in your low back or your belly.  Uterine contractions or abdominal cramping that do not improve with rest.  Chest pain.  Bleeding or fluid leaking from your vagina.  Shortness of breath. This information is not intended to replace advice given to you by your health care provider. Make sure you discuss any questions you have with your health care provider. Document Released: 09/13/2005 Document Revised: 02/11/2016 Document Reviewed: 11/21/2014 Elsevier Interactive Patient Education  2019 [employer] Eating Plan for Pregnant Women While you are pregnant, your body requires additional nutrition to help support your growing baby. You also have a higher need for some vitamins and minerals, such as folic acid, calcium, iron, and vitamin D. Eating a healthy, well-balanced diet is very important for your health and your baby's health. Your need for extra calories varies for the three 3-month segments of your pregnancy (trimesters). For most women, it is recommended to consume:  150 extra calories a day during the first trimester.  300 extra calories a day during the second trimester.  300 extra calories a day during the third trimester. What are tips for following this plan?   Do   not try to lose weight or go on a diet during pregnancy.  Limit your overall intake of foods that have "empty calories." These are foods that have little nutritional value, such as sweets, desserts, candies, and sugar-sweetened beverages.  Eat a variety of foods (especially fruits and vegetables) to get a full range of vitamins and minerals.  Take a prenatal vitamin  to help meet your additional vitamin and mineral needs during pregnancy, specifically for folic acid, iron, calcium, and vitamin D.  Remember to stay active. Ask your health care provider what types of exercise and activities are safe for you.  Practice good food safety and cleanliness. Wash your hands before you eat and after you prepare raw meat. Wash all fruits and vegetables well before peeling or eating. Taking these actions can help to prevent food-borne illnesses that can be very dangerous to your baby, such as listeriosis. Ask your health care provider for more information about listeriosis. What does 150 extra calories look like? Healthy options that provide 150 extra calories each day could be any of the following:  6-8 oz (170-230 g) of plain low-fat yogurt with  cup of berries.  1 apple with 2 teaspoons (11 g) of peanut butter.  Cut-up vegetables with  cup (60 g) of hummus.  8 oz (230 mL) or 1 cup of low-fat chocolate milk.  1 stick of string cheese with 1 medium orange.  1 peanut butter and jelly sandwich that is made with one slice of whole-wheat bread and 1 tsp (5 g) of peanut butter. For 300 extra calories, you could eat two of those healthy options each day. What is a healthy amount of weight to gain? The right amount of weight gain for you is based on your BMI before you became pregnant. If your BMI:  Was less than 18 (underweight), you should gain 28-40 lb (13-18 kg).  Was 18-24.9 (normal), you should gain 25-35 lb (11-16 kg).  Was 25-29.9 (overweight), you should gain 15-25 lb (7-11 kg).  Was 30 or greater (obese), you should gain 11-20 lb (5-9 kg). What if I am having twins or multiples? Generally, if you are carrying twins or multiples:  You may need to eat 300-600 extra calories a day.  The recommended range for total weight gain is 25-54 lb (11-25 kg), depending on your BMI before pregnancy.  Talk with your health care provider to find out about  nutritional needs, weight gain, and exercise that is right for you. What foods can I eat?  Grains All grains. Choose whole grains, such as whole-wheat bread, oatmeal, or brown rice. Vegetables All vegetables. Eat a variety of colors and types of vegetables. Remember to wash your vegetables well before peeling or eating. Fruits All fruits. Eat a variety of colors and types of fruit. Remember to wash your fruits well before peeling or eating. Meats and other protein foods Lean meats, including chicken, turkey, fish, and lean cuts of beef, veal, or pork. If you eat fish or seafood, choose options that are higher in omega-3 fatty acids and lower in mercury, such as salmon, herring, mussels, trout, sardines, pollock, shrimp, crab, and lobster. Tofu. Tempeh. Beans. Eggs. Peanut butter and other nut butters. Make sure that all meats, poultry, and eggs are cooked to food-safe temperatures or "well-done." Two or more servings of fish are recommended each week in order to get the most benefits from omega-3 fatty acids that are found in seafood. Choose fish that are lower in mercury. You can   find more information online:  www.fda.gov Dairy Pasteurized milk and milk alternatives (such as almond milk). Pasteurized yogurt and pasteurized cheese. Cottage cheese. Sour cream. Beverages Water. Juices that contain 100% fruit juice or vegetable juice. Caffeine-free teas and decaffeinated coffee. Drinks that contain caffeine are okay to drink, but it is better to avoid caffeine. Keep your total caffeine intake to less than 200 mg each day (which is 12 oz or 355 mL of coffee, tea, or soda) or the limit as told by your health care provider. Fats and oils Fats and oils are okay to include in moderation. Sweets and desserts Sweets and desserts are okay to include in moderation. Seasoning and other foods All pasteurized condiments. The items listed above may not be a complete list of recommended foods and beverages.  Contact your dietitian for more options. What foods are not recommended? Vegetables Raw (unpasteurized) vegetable juices. Fruits Unpasteurized fruit juices. Meats and other protein foods Lunch meats, bologna, hot dogs, or other deli meats. (If you must eat those meats, reheat them until they are steaming hot.) Refrigerated pat, meat spreads from a meat counter, smoked seafood that is found in the refrigerated section of a store. Raw or undercooked meats, poultry, and eggs. Raw fish, such as sushi or sashimi. Fish that have high mercury content, such as tilefish, shark, swordfish, and king mackerel. To learn more about mercury in fish, talk with your health care provider or look for online resources, such as:  www.fda.gov Dairy Raw (unpasteurized) milk and any foods that have raw milk in them. Soft cheeses, such as feta, queso blanco, queso fresco, Brie, Camembert cheeses, blue-veined cheeses, and Panela cheese (unless it is made with pasteurized milk, which must be stated on the label). Beverages Alcohol. Sugar-sweetened beverages, such as sodas, teas, or energy drinks. Seasoning and other foods Homemade fermented foods and drinks, such as pickles, sauerkraut, or kombucha drinks. (Store-bought pasteurized versions of these are okay.) Salads that are made in a store or deli, such as ham salad, chicken salad, egg salad, tuna salad, and seafood salad. The items listed above may not be a complete list of foods and beverages to avoid. Contact your dietitian for more information. Where to find more information To calculate the number of calories you need based on your height, weight, and activity level, you can use an online calculator such as:  www.choosemyplate.gov/MyPlatePlan To calculate how much weight you should gain during pregnancy, you can use an online pregnancy weight gain calculator such as:  www.choosemyplate.gov/pregnancy-weight-gain-calculator Summary  While you are pregnant,  your body requires additional nutrition to help support your growing baby.  Eat a variety of foods, especially fruits and vegetables to get a full range of vitamins and minerals.  Practice good food safety and cleanliness. Wash your hands before you eat and after you prepare raw meat. Wash all fruits and vegetables well before peeling or eating. Taking these actions can help to prevent food-borne illnesses, such as listeriosis, that can be very dangerous to your baby.  Do not eat raw meat or fish. Do not eat fish that have high mercury content, such as tilefish, shark, swordfish, and king mackerel. Do not eat unpasteurized (raw) dairy.  Take a prenatal vitamin to help meet your additional vitamin and mineral needs during pregnancy, specifically for folic acid, iron, calcium, and vitamin D. This information is not intended to replace advice given to you by your health care provider. Make sure you discuss any questions you have with your health care   provider. Document Released: 06/28/2014 Document Revised: 06/10/2017 Document Reviewed: 06/10/2017 Elsevier Interactive Patient Education  2019 [employer] Prenatal Care Prenatal care is health care during pregnancy. It helps you and your unborn baby (fetus) stay as healthy as possible. Prenatal care may be provided by a midwife, a family practice health care provider, or a childbirth and pregnancy specialist (obstetrician). How does this affect me? During pregnancy, you will be closely monitored for any new conditions that might develop. To lower your risk of pregnancy complications, you and your health care provider will talk about any underlying conditions you have. How does this affect my baby? Early and consistent prenatal care increases the chance that your baby will be healthy during pregnancy. Prenatal care lowers the risk that your baby will be:  Born early (prematurely).  Smaller than expected at birth (small for gestational age). What  can I expect at the first prenatal care visit? Your first prenatal care visit will likely be the longest. You should schedule your first prenatal care visit as soon as you know that you are pregnant. Your first visit is a good time to talk about any questions or concerns you have about pregnancy. At your visit, you and your health care provider will talk about:  Your medical history, including: ? Any past pregnancies. ? Your family's medical history. ? The baby's father's medical history. ? Any long-term (chronic) health conditions you have and how you manage them. ? Any surgeries or procedures you have had. ? Any current over-the-counter or prescription medicines, herbs, or supplements you are taking.  Other factors that could pose a risk to your baby, including:  Your home setting and your stress levels, including: ? Exposure to abuse or violence. ? Household financial strain. ? Mental health conditions you have.  Your daily health habits, including diet and exercise. Your health care provider will also:  Measure your weight, height, and blood pressure.  Do a physical exam, including a pelvic and breast exam.  Perform blood tests and urine tests to check for: ? Urinary tract infection. ? Sexually transmitted infections (STIs). ? Low iron levels in your blood (anemia). ? Blood type and certain proteins on red blood cells (Rh antibodies). ? Infections and immunity to viruses, such as hepatitis B and rubella. ? HIV (human immunodeficiency virus).  Do an ultrasound to confirm your baby's growth and development and to help predict your estimated due date (EDD). This ultrasound is done with a probe that is inserted into the vagina (transvaginal ultrasound).  Discuss your options for genetic screening.  Give you information about how to keep yourself and your baby healthy, including: ? Nutrition and taking vitamins. ? Physical activity. ? How to manage pregnancy symptoms such as  nausea and vomiting (morning sickness). ? Infections and substances that may be harmful to your baby and how to avoid them. ? Food safety. ? Dental care. ? Working. ? Travel. ? Warning signs to watch for and when to call your health care provider. How often will I have prenatal care visits? After your first prenatal care visit, you will have regular visits throughout your pregnancy. The visit schedule is often as follows:  Up to week 28 of pregnancy: once every 4 weeks.  28-36 weeks: once every 2 weeks.  After 36 weeks: every week until delivery. Some women may have visits more or less often depending on any underlying health conditions and the health of the baby. Keep all follow-up and prenatal care visits as told by   your health care provider. This is important. What happens during routine prenatal care visits? Your health care provider will:  Measure your weight and blood pressure.  Check for fetal heart sounds.  Measure the height of your uterus in your abdomen (fundal height). This may be measured starting around week 20 of pregnancy.  Check the position of your baby inside your uterus.  Ask questions about your diet, sleeping patterns, and whether you can feel the baby move.  Review warning signs to watch for and signs of labor.  Ask about any pregnancy symptoms you are having and how you are dealing with them. Symptoms may include: ? Headaches. ? Nausea and vomiting. ? Vaginal discharge. ? Swelling. ? Fatigue. ? Constipation. ? Any discomfort, including back or pelvic pain. Make a list of questions to ask your health care provider at your routine visits. What tests might I have during prenatal care visits? You may have blood, urine, and imaging tests throughout your pregnancy, such as:  Urine tests to check for glucose, protein, or signs of infection.  Glucose tests to check for a form of diabetes that can develop during pregnancy (gestational diabetes mellitus).  This is usually done around week 24 of pregnancy.  An ultrasound to check your baby's growth and development and to check for birth defects. This is usually done around week 20 of pregnancy.  A test to check for group B strep (GBS) infection. This is usually done around week 36 of pregnancy.  Genetic testing. This may include blood or imaging tests, such as an ultrasound. Some genetic tests are done during the first trimester and some are done during the second trimester. What else can I expect during prenatal care visits? Your health care provider may recommend getting certain vaccines during pregnancy. These may include:  A yearly flu shot (annual influenza vaccine). This is especially important if you will be pregnant during flu season.  Tdap (tetanus, diphtheria, pertussis) vaccine. Getting this vaccine during pregnancy can protect your baby from whooping cough (pertussis) after birth. This vaccine may be recommended between weeks 27 and 36 of pregnancy. Later in your pregnancy, your health care provider may give you information about:  Childbirth and breastfeeding classes.  Choosing a health care provider for your baby.  Umbilical cord banking.  Breastfeeding.  Birth control after your baby is born.  The hospital labor and delivery unit and how to tour it.  Registering at the hospital before you go into labor. Where to find more information  Office on Women's Health: womenshealth.gov  American Pregnancy Association: americanpregnancy.org  March of Dimes: marchofdimes.org Summary  Prenatal care helps you and your baby stay as healthy as possible during pregnancy.  Your first prenatal care visit will most likely be the longest.  You will have visits and tests throughout your pregnancy to monitor your health and your baby's health.  Bring a list of questions to your visits to ask your health care provider.  Make sure to keep all follow-up and prenatal care visits with  your health care provider. This information is not intended to replace advice given to you by your health care provider. Make sure you discuss any questions you have with your health care provider. Document Released: 09/16/2003 Document Revised: 09/12/2017 Document Reviewed: 09/12/2017 Elsevier Interactive Patient Education  2019 [employer]  

## 2018-10-12 NOTE — Progress Notes (Signed)
NOB, has not had any prenatal care since 06/2018 ER visit.

## 2018-10-13 ENCOUNTER — Encounter: Payer: Self-pay | Admitting: Advanced Practice Midwife

## 2018-10-13 NOTE — Progress Notes (Signed)
New Obstetric Patient H&P   Date of Visit: 10/12/2018 Chief Complaint: "Desires prenatal care"   History of Present Illness: Patient is a 28 y.o. [id number] Not Hispanic or Latino female, presents with amenorrhea and positive home pregnancy test. Patient's last menstrual period was 05/12/2018. and based on her  LMP, her EDD is Estimated Date of Delivery: 02/16/19 and her EGA is 7972w6d. Cycles are 5. days, regular, and occur approximately every : 28 days. Her last pap smear was 2 years ago and was no abnormalities.    She had a urine pregnancy test which was positive 3 month(s)  ago. Her last menstrual period was normal and lasted for  5 day(s). Since her LMP she claims she has experienced breast tenderness, fatigue, nausea, vomiting. She denies vaginal bleeding. Her past medical history is noncontributory. Her prior pregnancies are notable for 3 SABs, 2 TABs, 2018 FT SVD 6# with PP hemorrhage and retained placenta  Since her LMP, she admits to the use of tobacco products  no She claims she has gained   14 pounds since the start of her pregnancy.  There are cats in the home in the home  no  She admits close contact with children on a regular basis  yes  She has had chicken pox in the past yes She has had Tuberculosis exposures, symptoms, or previously tested positive for TB   no Current or past history of domestic violence. no  Genetic Screening/Teratology Counseling: (Includes patient, baby's father, or anyone in either family with:)   1. [facility]'s [facility]/= 3635 [facility]  no 2. Thalassemia ([place], [place], Mediterranean, or Asian background): MCV<80  no 3. Neural tube defect (meningomyelocele, spina bifida, anencephaly)  no 4. Congenital heart defect  no  5. Down syndrome  no 6. Tay-Sachs (Jewish, [place])  no 7. Canavan's Disease  no 8. Sickle cell disease or trait (African)  no  9. Hemophilia or other blood disorders  no  10. Muscular dystrophy  no  11. Cystic fibrosis  no  12.  Huntington's Chorea  no  13. Mental retardation/autism  Maternal aunt with MR 4614. Other inherited genetic or chromosomal disorder  no 15. Maternal metabolic disorder (DM, PKU, etc)  no 16. Patient or FOB with a child with a birth defect not listed above no  16a. Patient or FOB with a birth defect themselves no 17. Recurrent pregnancy loss, or stillbirth  3 SABs  18. Any medications since LMP other than prenatal vitamins (include vitamins, supplements, OTC meds, drugs, alcohol)  no 19. Any other genetic/environmental exposure to discuss  no  Infection History:   1. Lives with someone with TB or TB exposed  no  2. Patient or partner has history of genital herpes  no 3. Rash or viral illness since LMP  no 4. History of STI (GC, CT, HPV, syphilis, HIV)  no 5. History of recent travel :  no  Other pertinent information:  no     Review of Systems:10 point review of systems negative unless otherwise noted in HPI  Past Medical History:  Past Medical History:  Diagnosis Date  . Medical history non-contributory     Past Surgical History:  Past Surgical History:  Procedure Laterality Date  . DILATION AND CURETTAGE OF UTERUS  2008, 2012   D&E EAB  . DILATION AND CURETTAGE OF UTERUS N/A 11/03/2016   Procedure: DILATATION AND CURETTAGE;  Surgeon: Nadara Mustardobert P Harris, MD;  Location: [facility] ORS;  Service: Gynecology;  Laterality: N/A;  Gynecologic History: Patient's last menstrual period was 05/12/2018.  Obstetric History: [id number]  Family History:  Family History  Problem Relation Age of Onset  . Diabetes Mother   . Hyperlipidemia Mother   . Hypertension Mother   . Depression Mother   . Pancreatitis Mother   . Diabetes Father   . Breast cancer Maternal Aunt     Social History:  Social History   Socioeconomic History  . Marital status: Single    Spouse name: Not on file  . Number of children: Not on file  . Years of education: Not on file  . Highest education level: Not on  file  Occupational History  . Not on file  Social Needs  . Financial resource strain: Not on file  . Food insecurity:    Worry: Not on file    Inability: Not on file  . Transportation needs:    Medical: Not on file    Non-medical: Not on file  Tobacco Use  . Smoking status: Former Games developermoker  . Smokeless tobacco: Never Used  Substance and Sexual Activity  . Alcohol use: No  . Drug use: Yes    Types: Marijuana  . Sexual activity: Yes    Birth control/protection: None  Lifestyle  . Physical activity:    Days per week: Not on file    Minutes per session: Not on file  . Stress: Not on file  Relationships  . Social connections:    Talks on phone: Not on file    Gets together: Not on file    Attends religious service: Not on file    Active member of club or organization: Not on file    Attends meetings of clubs or organizations: Not on file    Relationship status: Not on file  . Intimate partner violence:    Fear of current or ex partner: Not on file    Emotionally abused: Not on file    Physically abused: Not on file    Forced sexual activity: Not on file  Other Topics Concern  . Not on file  Social History Narrative  . Not on file    Allergies:  No Known Allergies  Medications: Prior to Admission medications   Medication Sig Start Date End Date Taking? Authorizing Provider  Prenatal Vit-Fe Fumarate-FA (PRENATAL MULTIVITAMIN) TABS tablet Take 1 tablet by mouth daily at 12 noon. 07/18/18  Yes Christeen DouglasBeasley, Bethany, MD  acetaminophen (TYLENOL) 325 MG tablet Take 325 mg by mouth every 4 (four) hours as needed for mild pain or headache.     [provider]    Physical Exam Vitals: Blood pressure 124/74, last menstrual period 05/12/2018, unknown if currently breastfeeding.  General: NAD HEENT: normocephalic, anicteric Thyroid: no enlargement, no palpable nodules Pulmonary: No increased work of breathing, CTAB Cardiovascular: RRR, distal pulses 2+ Abdomen: NABS,  soft, non-tender, non-distended.  Umbilicus without lesions.  No hepatomegaly, splenomegaly or masses palpable. No evidence of hernia. FHTs 130s Genitourinary: deferred for no concerns/no PAP Extremities: no edema, erythema, or tenderness Neurologic: Grossly intact Psychiatric: mood appropriate, affect full   Assessment: 28 y.o. [id number] at 2032w0d presenting to initiate prenatal care  Plan: 1) Avoid alcoholic beverages. 2) Patient encouraged not to smoke.  3) Discontinue the use of all non-medicinal drugs and chemicals.  4) Take prenatal vitamins daily.  5) Nutrition, food safety (fish, cheese advisories, and high nitrite foods) and exercise discussed. 6) Hospital and practice style discussed with cross coverage system.  7) Genetic Screening, such as  with 1st Trimester Screening, cell free fetal DNA, AFP testing, and Ultrasound, as well as with amniocentesis and CVS as appropriate, is discussed with patient. At the conclusion of today's visit patient requested MaterniT 21 genetic testing 8) Patient is asked about travel to areas at risk for the [place] virus, and counseled to avoid travel and exposure to mosquitoes or sexual partners who may have themselves been exposed to the virus. Testing is discussed, and will be ordered as appropriate.  9) Return in 1 week for anatomy scan and rob   Tresea Mall, CNM [facility], [facility] 10/13/2018, 2:35 PM

## 2018-10-14 LAB — URINE CULTURE

## 2018-10-16 LAB — MATERNIT21 PLUS CORE+SCA
Chromosome 13: NEGATIVE
Chromosome 18: NEGATIVE
Chromosome 21: NEGATIVE
Fetal Fraction: 9
Y Chromosome: NOT DETECTED

## 2018-10-16 LAB — CERVICOVAGINAL ANCILLARY ONLY
Chlamydia: NEGATIVE
Neisseria Gonorrhea: NEGATIVE
Trichomonas: NEGATIVE

## 2018-10-17 LAB — RPR+RH+ABO+RUB AB+AB SCR+CB...
Antibody Screen: NEGATIVE
HIV Screen 4th Generation wRfx: NONREACTIVE
Hematocrit: 28.3 % — ABNORMAL LOW (ref 34.0–46.6)
Hemoglobin: 9.9 g/dL — ABNORMAL LOW (ref 11.1–15.9)
Hepatitis B Surface Ag: NEGATIVE
MCH: 29.1 pg (ref 26.6–33.0)
MCHC: 35 g/dL (ref 31.5–35.7)
MCV: 83 fL (ref 79–97)
Platelets: 298 10*3/uL (ref 150–450)
RBC: 3.4 x10E6/uL — ABNORMAL LOW (ref 3.77–5.28)
RDW: 13.5 % (ref 11.7–15.4)
RPR Ser Ql: NONREACTIVE
Rh Factor: POSITIVE
Rubella Antibodies, IGG: 7.58 index (ref >0.99)
Varicella zoster IgG: 2736 index (ref >165)
WBC: 6.5 10*3/uL (ref 3.4–10.8)

## 2018-10-17 LAB — HEMOGLOBINOPATHY EVALUATION
HGB C: 38.1 % — ABNORMAL HIGH
HGB S: 0 %
HGB VARIANT: 0 %
Hemoglobin A2 Quantitation: 3.3 % — ABNORMAL HIGH (ref 1.8–3.2)
Hemoglobin F Quantitation: 0 % (ref 0.0–2.0)
Hgb A: 58.6 % — ABNORMAL LOW (ref 96.4–98.8)

## 2018-10-17 LAB — HGB A1C W/O EAG: Hgb A1c MFr Bld: 5.4 % (ref 4.8–5.6)

## 2018-10-20 ENCOUNTER — Encounter: Payer: Self-pay | Admitting: Advanced Practice Midwife

## 2018-10-20 ENCOUNTER — Ambulatory Visit (INDEPENDENT_AMBULATORY_CARE_PROVIDER_SITE_OTHER): Payer: Medicaid Other | Admitting: Advanced Practice Midwife

## 2018-10-20 ENCOUNTER — Ambulatory Visit (INDEPENDENT_AMBULATORY_CARE_PROVIDER_SITE_OTHER): Payer: Medicaid Other

## 2018-10-20 VITALS — BP 122/70 | Wt 237.0 lb

## 2018-10-20 DIAGNOSIS — Z363 Encounter for antenatal screening for malformations: Secondary | ICD-10-CM | POA: Diagnosis not present

## 2018-10-20 DIAGNOSIS — O0990 Supervision of high risk pregnancy, unspecified, unspecified trimester: Secondary | ICD-10-CM

## 2018-10-20 DIAGNOSIS — Z131 Encounter for screening for diabetes mellitus: Secondary | ICD-10-CM

## 2018-10-20 DIAGNOSIS — O0992 Supervision of high risk pregnancy, unspecified, second trimester: Secondary | ICD-10-CM

## 2018-10-20 DIAGNOSIS — Z113 Encounter for screening for infections with a predominantly sexual mode of transmission: Secondary | ICD-10-CM

## 2018-10-20 DIAGNOSIS — Z3A23 23 weeks gestation of pregnancy: Secondary | ICD-10-CM

## 2018-10-20 DIAGNOSIS — Z130 Encounter for screening for diseases of the blood and blood-forming organs and certain disorders involving the immune mechanism: Secondary | ICD-10-CM

## 2018-10-20 NOTE — Progress Notes (Signed)
Anatomy scan today. C/O still dealing w/morning sickness. Patient unable to produce urine specimen at this time.

## 2018-10-20 NOTE — Progress Notes (Signed)
  Routine Prenatal Care Visit  Subjective  Sierra Jordan is a 28 y.o. G7P1051 at [redacted]w[redacted]d being seen today for ongoing prenatal care.  She is currently monitored for the following issues for this high-risk pregnancy and has Hyperemesis gravidarum with dehydration; Supervision of high risk pregnancy, antepartum; BMI 37.0-37.9, adult; and Gestational hypertension on their problem list.  ----------------------------------------------------------------------------------- Patient reports still having some nausea but no vomiting. She has itchy dry patches inbetween the fingers of her right hand. She had the same rash during her first pregnancy and is wondering what she can put on it. Recommend hypoallergenic soap, keep moisturized, hydrocortisone cream to itchy areas.     . Vag. Bleeding: None.  Movement: Present. Denies leaking of fluid.  ----------------------------------------------------------------------------------- The following portions of the patient's history were reviewed and updated as appropriate: allergies, current medications, past family history, past medical history, past social history, past surgical history and problem list. Problem list updated.   Objective  Blood pressure 122/70, weight 237 lb (107.5 kg), last menstrual period 05/12/2018 Pregravid weight 220 lb (99.8 kg) Total Weight Gain 17 lb (7.711 kg) Urinalysis: Urine Protein    Urine Glucose    Fetal Status: Fetal Heart Rate (bpm): 156 Fundal Height: 23 cm Movement: Present     Anatomy scan today is incomplete for lateral ventricles, RVOT, profile and otherwise normal  General:  Alert, oriented and cooperative. Patient is in no acute distress.  Skin: Skin is warm and dry. No rash noted.   Cardiovascular: Normal heart rate noted  Respiratory: Normal respiratory effort, no problems with respiration noted  Abdomen: Soft, gravid, appropriate for gestational age.       Pelvic:  Cervical exam deferred        Extremities:  Normal range of motion.  Edema: None  Mental Status: Normal mood and affect. Normal behavior. Normal judgment and thought content.   Assessment   28 y.o. A6T0160 at [redacted]w[redacted]d by  02/16/2019, by Last Menstrual Period presenting for routine prenatal visit  Plan   pregnancy Problems (from 10/12/18 to present)    No problems associated with this episode.       Preterm labor symptoms and general obstetric precautions including but not limited to vaginal bleeding, contractions, leaking of fluid and fetal movement were reviewed in detail with the patient.    Return in about 4 weeks (around 11/17/2018) for 28 wk labs/f/u anatomy/rob.  Tresea Mall, CNM 10/20/2018 3:28 PM

## 2018-10-23 ENCOUNTER — Telehealth: Payer: Self-pay

## 2018-10-23 ENCOUNTER — Other Ambulatory Visit: Payer: Self-pay | Admitting: Obstetrics & Gynecology

## 2018-10-23 MED ORDER — ONDANSETRON 4 MG PO TBDP: 4.0000 mg | ORAL_TABLET | Freq: Four times a day (QID) | ORAL | 0 refills | Status: DC | PRN

## 2018-10-23 NOTE — Telephone Encounter (Signed)
Pt calling for rx for nausea.  States she mentioned it at last visit but is wasn't sent in.  [phone]

## 2018-11-17 ENCOUNTER — Ambulatory Visit: Payer: Medicaid Other

## 2018-11-17 ENCOUNTER — Encounter: Payer: Medicaid Other | Admitting: Maternal Newborn

## 2018-11-17 ENCOUNTER — Other Ambulatory Visit: Payer: Medicaid Other

## 2018-11-20 ENCOUNTER — Other Ambulatory Visit: Payer: Self-pay | Admitting: Obstetrics & Gynecology

## 2018-11-27 ENCOUNTER — Telehealth: Payer: Self-pay | Admitting: Advanced Practice Midwife

## 2018-11-27 NOTE — Telephone Encounter (Signed)
Patient was schedule 11/17/18. Appointment was cancelled due to bad weather conditions. I attempt to reach patient to reschedule 28 wk labs/f/u anatomy/rob patient states becauser of her job she cant schedule appointment at this time but will call back to reschedule

## 2018-12-18 ENCOUNTER — Ambulatory Visit: Payer: Medicaid Other

## 2018-12-18 ENCOUNTER — Other Ambulatory Visit: Payer: Self-pay | Admitting: Obstetrics & Gynecology

## 2018-12-18 ENCOUNTER — Other Ambulatory Visit: Payer: Medicaid Other

## 2018-12-18 ENCOUNTER — Encounter: Payer: Medicaid Other | Admitting: Obstetrics & Gynecology

## 2018-12-23 ENCOUNTER — Emergency Department
Admission: EM | Admit: 2018-12-23 | Discharge: 2018-12-23 | Disposition: A | Discharge status: Home / Self Care | Payer: Medicaid Other | Attending: Emergency Medicine | Admitting: Emergency Medicine

## 2018-12-23 ENCOUNTER — Other Ambulatory Visit: Payer: Self-pay

## 2018-12-23 DIAGNOSIS — R0602 Shortness of breath: Secondary | ICD-10-CM | POA: Diagnosis not present

## 2018-12-23 DIAGNOSIS — O9989 Other specified diseases and conditions complicating pregnancy, childbirth and the puerperium: Secondary | ICD-10-CM | POA: Diagnosis present

## 2018-12-23 DIAGNOSIS — Z87891 Personal history of nicotine dependence: Secondary | ICD-10-CM | POA: Insufficient documentation

## 2018-12-23 DIAGNOSIS — Z3A32 32 weeks gestation of pregnancy: Secondary | ICD-10-CM | POA: Diagnosis not present

## 2018-12-23 DIAGNOSIS — F1210 Cannabis abuse, uncomplicated: Secondary | ICD-10-CM | POA: Diagnosis not present

## 2018-12-23 DIAGNOSIS — J069 Acute upper respiratory infection, unspecified: Secondary | ICD-10-CM | POA: Insufficient documentation

## 2018-12-23 DIAGNOSIS — R05 Cough: Secondary | ICD-10-CM | POA: Insufficient documentation

## 2018-12-23 LAB — CBC
HCT: 26 % — ABNORMAL LOW (ref 36.0–46.0)
Hemoglobin: 9.1 g/dL — ABNORMAL LOW (ref 12.0–15.0)
MCH: 27.8 pg (ref 26.0–34.0)
MCHC: 35 g/dL (ref 30.0–36.0)
MCV: 79.5 fL — ABNORMAL LOW (ref 80.0–100.0)
Platelets: 251 10*3/uL (ref 150–400)
RBC: 3.27 MIL/uL — ABNORMAL LOW (ref 3.87–5.11)
RDW: 13.6 % (ref 11.5–15.5)
WBC: 9 10*3/uL (ref 4.0–10.5)
nRBC: 0 % (ref 0.0–0.2)

## 2018-12-23 LAB — COMPREHENSIVE METABOLIC PANEL
ALT: 13 U/L (ref 0–44)
AST: 15 U/L (ref 15–41)
Albumin: 2.4 g/dL — ABNORMAL LOW (ref 3.5–5.0)
Alkaline Phosphatase: 84 U/L (ref 38–126)
Anion gap: 6 (ref 5–15)
BUN: 12 mg/dL (ref 6–20)
CO2: 21 mmol/L — ABNORMAL LOW (ref 22–32)
Calcium: 8.1 mg/dL — ABNORMAL LOW (ref 8.9–10.3)
Chloride: 110 mmol/L (ref 98–111)
Creatinine, Ser: 0.45 mg/dL (ref 0.44–1.00)
GFR calc Af Amer: >60 mL/min (ref >60)
GFR calc non Af Amer: >60 mL/min (ref >60)
Glucose, Bld: 91 mg/dL (ref 70–99)
Potassium: 3.6 mmol/L (ref 3.5–5.1)
Sodium: 137 mmol/L (ref 135–145)
Total Bilirubin: 0.3 mg/dL (ref 0.3–1.2)
Total Protein: 5.9 g/dL — ABNORMAL LOW (ref 6.5–8.1)

## 2018-12-23 LAB — TROPONIN I: Troponin I: <0.03 ng/mL (ref <0.03)

## 2018-12-23 LAB — INFLUENZA PANEL BY PCR (TYPE A & B)
Influenza A By PCR: NEGATIVE
Influenza B By PCR: NEGATIVE

## 2018-12-23 NOTE — ED Notes (Signed)
Pt ambulatory to toilet with steady gait noted.  

## 2018-12-23 NOTE — ED Provider Notes (Signed)
[facility] Emergency Department Provider Note  Time seen: 12:04 PM  I have reviewed the triage vital signs and the nursing notes.   HISTORY  Chief Complaint Flu Like symptoms    HPI Sierra Jordan is a 28 y.o. female with no significant chronic past medical history, [redacted] weeks pregnant, presents to the emergency department for shortness of breath and cough.  According to the patient she states her boss at work was recently diagnosed with influenza and she had exposure to him.  Over the past 2 days she has been coughing and was experiencing some shortness of breath.  Patient states her shortness of breath is worse with the cough.  Denies any pain or discomfort in the chest.  No pleuritic pain.  Patient called her doctor and they told her to come to the emergency department for evaluation.  Here the patient thinks her doctor "overreacted."  Patient denies any fever at any point.  No travel history.   Past Medical History:  Diagnosis Date  . Medical history non-contributory     Patient Active Problem List   Diagnosis Date Noted  . Supervision of high risk pregnancy, antepartum 11/02/2016  . BMI 37.0-37.9, adult 11/02/2016  . Gestational hypertension 11/02/2016  . Hyperemesis gravidarum with dehydration 04/22/2016    Past Surgical History:  Procedure Laterality Date  . DILATION AND CURETTAGE OF UTERUS  2008, 2012   D&E EAB  . DILATION AND CURETTAGE OF UTERUS N/A 11/03/2016   Procedure: DILATATION AND CURETTAGE;  Surgeon: Nadara Mustard, MD;  Location: [facility] ORS;  Service: Gynecology;  Laterality: N/A;    Prior to Admission medications   Medication Sig Start Date End Date Taking? Authorizing Provider  acetaminophen (TYLENOL) 325 MG tablet Take 325 mg by mouth every 4 (four) hours as needed for mild pain or headache.     [provider]  ondansetron (ZOFRAN-ODT) 4 MG disintegrating tablet TAKE 1 TABLET BY MOUTH EVERY 6 HOURS AS NEEDED FOR NAUSEA 12/18/18    Nadara Mustard, MD  Prenatal Vit-Fe Fumarate-FA (PRENATAL MULTIVITAMIN) TABS tablet Take 1 tablet by mouth daily at 12 noon. 07/18/18   Christeen Douglas, MD    No Known Allergies  Family History  Problem Relation Age of Onset  . Diabetes Mother   . Hyperlipidemia Mother   . Hypertension Mother   . Depression Mother   . Pancreatitis Mother   . Diabetes Father   . Breast cancer Maternal Aunt     Social History Social History   Tobacco Use  . Smoking status: Former Games developer  . Smokeless tobacco: Never Used  Substance Use Topics  . Alcohol use: No  . Drug use: Yes    Types: Marijuana    Review of Systems Constitutional: Negative for fever. ENT: Negative for recent illness/congestion Cardiovascular: Negative for chest pain. Respiratory: Positive for cough, dry cough.  Positive for shortness of breath, worse with coughing spells. Gastrointestinal: Negative for abdominal pain, vomiting  Musculoskeletal: Negative for leg pain Neurological: Negative for headache All other ROS negative  ____________________________________________   PHYSICAL EXAM:  VITAL SIGNS: ED Triage Vitals  Enc Vitals Group     BP 12/23/18 1142 (!) 155/93     Pulse Rate 12/23/18 1142 (!) 58     Resp 12/23/18 1142 18     Temp 12/23/18 1142 98 F (36.7 C)     Temp Source 12/23/18 1142 Oral     SpO2 12/23/18 1142 99 %     Weight  12/23/18 1140 240 lb (108.9 kg)     Height 12/23/18 1140 5\' 3"  (1.6 m)     Head Circumference --      Peak Flow --      Pain Score 12/23/18 1139 3     Pain Loc --      Pain Edu? --      Excl. in GC? --    Constitutional: Alert and oriented. Well appearing and in no distress. Eyes: Normal exam ENT   Head: Normocephalic and atraumatic.   Mouth/Throat: Mucous membranes are moist. Cardiovascular: Normal rate, regular rhythm. No murmur Respiratory: Normal respiratory effort without tachypnea nor retractions. Breath sounds are clear Gastrointestinal: Soft and  nontender. No distention.   Musculoskeletal: Nontender with normal range of motion in all extremities. Neurologic:  Normal speech and language. No gross focal neurologic deficits Skin:  Skin is warm, dry and intact.  Psychiatric: Mood and affect are normal.   ____________________________________________    EKG  EKG viewed and interpreted by myself shows sinus bradycardia 59 bpm with a narrow QRS, normal axis, normal intervals, no concerning ST changes.  ____________________________________________  INITIAL IMPRESSION / ASSESSMENT AND PLAN / ED COURSE  Pertinent labs & imaging results that were available during my care of the patient were reviewed by me and considered in my medical decision making (see chart for details).  Patient presents to the emergency department with cough and shortness of breath.  Patient states her boss at her work was recently diagnosed with influenza with whom she has close contact and she is concerned that she could have the flu.  Patient denies any fever at home.  No chest pain.  No pleuritic pain.  Positive for cough.  Clear lung sounds without any wheeze rales or rhonchi.  Normal pulse rate, 99% O2 sat on room air.  We will check labs including cardiac enzymes, obtain a flu swab as well as EKG and continue to closely monitor.  Overall the patient appears very well, no distress.  No fever or travel history, very low suspicion for coronavirus.  Patient's labs are largely within normal limits including a negative troponin.  Flu test is negative.  Patient continues to appear very well in the emergency department, no distress with reassuring vitals.  We will discharge the patient home with OB follow-up.  I discussed return precautions for any chest pain, worsening trouble breathing.  Patient agreeable to plan of care.  Sierra Jordan was evaluated in Emergency Department on 12/23/2018 for the symptoms described in the history of present illness. She was evaluated in the  context of the global COVID-19 pandemic, which necessitated consideration that the patient might be at risk for infection with the SARS-CoV-2 virus that causes COVID-19. Institutional protocols and algorithms that pertain to the evaluation of patients at risk for COVID-19 are in a state of rapid change based on information released by regulatory bodies including the CDC and federal and state organizations. These policies and algorithms were followed during the patient's care in the ED.   ____________________________________________   FINAL CLINICAL IMPRESSION(S) / ED DIAGNOSES  Cough Shortness of breath   Minna Antis, MD 12/23/18 1326

## 2018-12-23 NOTE — ED Triage Notes (Addendum)
Pt to ED with c/o of flu like symptoms. Pt state N/V x 5 in last 24 hrs. Pt also with c/o of chest pressure and cough x2 days. Pt recently exposed to flu by coworker. Pt is [redacted] weeks pregnant.

## 2018-12-23 NOTE — ED Notes (Signed)
EDP at bedside  

## 2018-12-24 ENCOUNTER — Other Ambulatory Visit: Payer: Self-pay | Admitting: Advanced Practice Midwife

## 2018-12-24 DIAGNOSIS — O219 Vomiting of pregnancy, unspecified: Secondary | ICD-10-CM

## 2018-12-24 MED ORDER — PROMETHAZINE HCL 25 MG RE SUPP: 25.0000 mg | Freq: Four times a day (QID) | RECTAL | 0 refills | Status: DC | PRN

## 2018-12-24 NOTE — Progress Notes (Signed)
Rx phenergan suppository sent to patient pharmacy. She was seen in ER for SOB yesterday and was diagnosed with a virus per triage nurse. She developed nausea and vomiting overnight and zofran is not working.

## 2018-12-25 ENCOUNTER — Telehealth: Payer: Self-pay

## 2018-12-25 ENCOUNTER — Other Ambulatory Visit: Payer: Self-pay | Admitting: Obstetrics and Gynecology

## 2018-12-25 DIAGNOSIS — O211 Hyperemesis gravidarum with metabolic disturbance: Secondary | ICD-10-CM

## 2018-12-25 DIAGNOSIS — O0990 Supervision of high risk pregnancy, unspecified, unspecified trimester: Secondary | ICD-10-CM

## 2018-12-25 MED ORDER — PROMETHAZINE HCL 25 MG PO TABS: 25.0000 mg | ORAL_TABLET | Freq: Four times a day (QID) | ORAL | 0 refills | Status: DC | PRN

## 2018-12-25 NOTE — Telephone Encounter (Signed)
Sent!

## 2018-12-25 NOTE — Telephone Encounter (Signed)
Also sent to [facility] [address]

## 2018-12-25 NOTE — Telephone Encounter (Signed)
Pt states her insurance will not cover the suppository Phenergan, can it be changed to the tablet.

## 2018-12-27 ENCOUNTER — Other Ambulatory Visit: Payer: Medicaid Other

## 2018-12-27 ENCOUNTER — Other Ambulatory Visit: Payer: Self-pay

## 2018-12-27 ENCOUNTER — Ambulatory Visit (INDEPENDENT_AMBULATORY_CARE_PROVIDER_SITE_OTHER): Payer: Medicaid Other | Admitting: Obstetrics & Gynecology

## 2018-12-27 ENCOUNTER — Ambulatory Visit (INDEPENDENT_AMBULATORY_CARE_PROVIDER_SITE_OTHER): Payer: Medicaid Other

## 2018-12-27 VITALS — BP 120/80 | Wt 256.0 lb

## 2018-12-27 DIAGNOSIS — Z23 Encounter for immunization: Secondary | ICD-10-CM

## 2018-12-27 DIAGNOSIS — Z3A32 32 weeks gestation of pregnancy: Secondary | ICD-10-CM

## 2018-12-27 DIAGNOSIS — O0990 Supervision of high risk pregnancy, unspecified, unspecified trimester: Secondary | ICD-10-CM

## 2018-12-27 DIAGNOSIS — Z130 Encounter for screening for diseases of the blood and blood-forming organs and certain disorders involving the immune mechanism: Secondary | ICD-10-CM

## 2018-12-27 DIAGNOSIS — Z362 Encounter for other antenatal screening follow-up: Secondary | ICD-10-CM

## 2018-12-27 DIAGNOSIS — Z113 Encounter for screening for infections with a predominantly sexual mode of transmission: Secondary | ICD-10-CM

## 2018-12-27 DIAGNOSIS — Z131 Encounter for screening for diabetes mellitus: Secondary | ICD-10-CM

## 2018-12-27 DIAGNOSIS — O99213 Obesity complicating pregnancy, third trimester: Secondary | ICD-10-CM

## 2018-12-27 DIAGNOSIS — O99210 Obesity complicating pregnancy, unspecified trimester: Secondary | ICD-10-CM

## 2018-12-27 LAB — POCT URINALYSIS DIPSTICK OB: Glucose, UA: NEGATIVE

## 2018-12-27 NOTE — Patient Instructions (Addendum)
 COVID-19 and Your Pregnancy FAQ  How can I prevent infection with COVID-19 during my pregnancy? Social distancing is key. Please limit any interactions in public. Try and work from home if possible. Frequently wash your hands after touching possibly contaminated surfaces. Avoid touching your face.  Minimize trips to the store. Consider online ordering when possible.   Should I wear a mask? Masks should only be worn by those experiencing symptoms of COVID-19 or those with confirmed COVID-19 when they are in public or around other individuals.  What are the symptoms of COVID-19? Fever (greater than 100.4 F), dry cough, shortness of breath.  Am I more at risk for COVID-19 since I am pregnant? There is not currently data showing that pregnant women are more adversely impacted by COVID-19 than the general population. However, we know that pregnant women tend to have worse respiratory complications from similar diseases such as the flu and SARS and for this reason should be considered an at-risk population.  What do I do if I am experiencing the symptoms of COVID-19? Testing is being limited because of test availability. If you are experiencing symptoms you should quarantine yourself, and the members of your family, for at least 2 weeks at home.   Please visit this website for more information: https://www.cdc.gov/coronavirus/2019-ncov/if-you-are-sick/steps-when-sick.html  When should I go to the Emergency Room? Please go to the emergency room if you are experiencing ANY of these symptoms*:  1.    Difficulty breathing or shortness of breath 2.    Persistent pain or pressure in the chest 3.    Confusion or difficulty being aroused (or awakened) 4.    Bluish lips or face  *This list is not all inclusive. Please consult our office for any other symptoms that are severe or concerning.  What do I do if I am having difficulty breathing? You should go to the Emergency Room for evaluation. At  this time they have a tent set up for evaluating patients with COVID-19 symptoms.   How will my prenatal care be different because of the COVID-19 pandemic? It has been recommended to reduce the frequency of face-to-face visits and use resources such as telephone and virtual visits when possible. Using a scale, blood pressure machine and fetal doppler at home can further help reduce face-to-face visits. You will be provided with additional information on this topic.  We ask that you come to your visits alone to minimize potential exposures to  COVID-19.  How can I receive childbirth education? At this time in-person classes have been cancelled. You can register for online childbirth education, breastfeeding, and newborn care classes.  Please visit:  [email or web address] for more information  How will my hospital birth experience be different? The hospital is currently limiting visitors. This means that while you are in labor you can only have one person at the hospital with you. Additional family members will not be allowed to wait in the building or outside your room. Your one support person can be the father of the baby, a relative, a doula, or a friend. Once one support person is designated that person will wear a band. This band cannot be shared with multiple people.  How long will I stay in the hospital for after giving birth? It is also recommended that discharge home be expedited during the COVID-19 outbreak. This means staying for 1 day after a vaginal delivery and 2 days after a cesarean section.  What if I have COVID-19 and I am in labor? We   ask that you wear a mask while on labor and delivery. We will try and accommodate you being placed in a room that is capable of filtering the air. Please call ahead if you are in labor and on your way to the hospital. The phone number for labor and delivery at [facility] is [phone].  If I have COVID-19 when my baby  is born how can I prevent my baby from contracting COVID-19? This is an issue that will have to be discussed on a case-by-case basis. Current recommendations suggest providing separate isolation rooms for both the mother and new infant as well as limiting visitors. However, there are practical challenges to this recommendation. The situation will assuredly change and decisions will be influenced by the desires of the mother and availability of space.  Some suggestions are the use of a curtain or physical barrier between mom and infant, hand hygiene, mom wearing a mask, or 6 feet of spacing between a mom and infant.   Can I breastfeed during the COVID-19 pandemic?   Yes, breastfeeding is encouraged.  Can I breastfeed if I have COVID-19? Yes. Covid-19 has not been found in breast milk. This means you cannot give COVID-19 to your child through breast milk. Breast feeding will also help pass antibodies to fight infection to your baby.   What precautions should I take when breastfeeding if I have COVID-19? If a mother and newborn do room-in and the mother wishes to feed at the breast, she should put on a facemask and practice hand hygiene before each feeding.  What precautions should I take when pumping if I have COVID-19? Prior to expressing breast milk, mothers should practice hand hygiene. After each pumping session, all parts that come into contact with breast milk should be thoroughly washed and the entire pump should be appropriately disinfected per the manufacturer's instructions. This expressed breast milk should be fed to the newborn by a healthy caregiver.  What if I am pregnant and work in healthcare? Based on limited data regarding COVID-19 and pregnancy, ACOG currently does not propose creating additional restrictions on pregnant health care personnel because of COVID-19 alone. Pregnant women do not appear to be at higher risk of severe disease related to COVID-19. Pregnant health care  personnel should follow CDC risk assessment and infection control guidelines for health care personnel exposed to patients with suspected or confirmed COVID-19. Adherence to recommended infection prevention and control practices is an important part of protecting all health care personnel in health care settings.    Information on COVID-19 in pregnancy is very limited; however, facilities may want to consider limiting exposure of pregnant health care personnel to patients with confirmed or suspected COVID-19 infection, especially during higher-risk procedures (eg, aerosol-generating procedures), if feasible, based on staffing availability.     Hello,  Given the current COVID-19 pandemic, our practice is making changes in how we are providing care to our patients. We are limiting in-person visits for the safety of all of our patients.   As a practice, we have met to discuss the best way to minimize visits, but still provide excellent care to our expecting mothers.  We have decided on the following visit structure for low-risk pregnancies.  Initial Pregnancy visit will be conducted as a telephone or web visit.  Between 10-14 weeks  there will be one in-person visit for an ultrasound, lab work, and genetic screening. 20 weeks in-person visit with an anatomy ultrasound  28 weeks in-person office   visit for a 1-hour glucose test and a TDAP vaccination 32 weeks in-person office visit 34 weeks telephone visit 36 weeks in-person office visit for GBS, chlamydia, and gonorrhea testing 38 weeks in-person office visit 40 weeks in-person office visit  Understandably, some patients will require more visits than what is outlined above. Additional visits will be determined on a case-by-case basis.   We will, as always, be available for emergencies or to address concerns that might arise between in-person visits. We ask that you allow us the opportunity to address any concerns over the phone or through a  virtual visit first. We will be available to return your phone calls throughout the day.   If you are able to purchase a scale, a blood pressure machine, and a home fetal doppler visits could be limited further. This will help decrease your exposure risks, but these purchases are not a necessity.   Things seem to change daily and there is the possibility that this structure could change, please be patient as we adapt to a new way of caring for patients.   Thank you for trusting us with your prenatal care. Our practice values you and looks forward to providing you with excellent care.   Sincerely,   [facility], [facility]  

## 2018-12-27 NOTE — Addendum Note (Signed)
Addended by: Cornelius Moras D on: 12/27/2018 10:52 AM   Modules accepted: Orders

## 2018-12-27 NOTE — Progress Notes (Signed)
  Subjective  Fetal Movement? yes Contractions? no Leaking Fluid? no Vaginal Bleeding? no  Objective  BP 120/80   Wt 256 lb (116.1 kg)   LMP 05/12/2018   BMI 45.35 kg/m  General: NAD Pumonary: no increased work of breathing Abdomen: gravid, non-tender Extremities: no edema Psychiatric: mood appropriate, affect full  Assessment  28 y.o. [id number] at [redacted]w[redacted]d by  02/16/2019, by Last Menstrual Period presenting for routine prenatal visit  Plan   Problem List Items Addressed This Visit      Other   Supervision of high risk pregnancy, antepartum    Other Visit Diagnoses    [redacted] weeks gestation of pregnancy    -  Primary   Obesity affecting pregnancy, antepartum        Review of ULTRASOUND. I have personally reviewed images and report of recent ultrasound done at [facility]. There is a singleton gestation with subjectively normal amniotic fluid volume. The fetal biometry correlates with established dating. Detailed evaluation of the fetal anatomy was performed.The fetal anatomical survey appears within normal limits within the resolution of ultrasound as described above.  It must be noted that a normal ultrasound is unable to rule out fetal aneuploidy.    Glucola today [id number] discussed  Annamarie Major, MD, [facility], [facility] 12/27/2018  10:43 AM

## 2018-12-27 NOTE — Addendum Note (Signed)
Addended by: Cornelius Moras D on: 12/27/2018 10:56 AM   Modules accepted: Orders

## 2018-12-28 LAB — 28 WEEK RH+PANEL
Basophils Absolute: 0 10*3/uL (ref 0.0–0.2)
Basos: 0 %
EOS (ABSOLUTE): 0 10*3/uL (ref 0.0–0.4)
Eos: 1 %
Gestational Diabetes Screen: 183 mg/dL — ABNORMAL HIGH (ref 65–139)
HIV Screen 4th Generation wRfx: NONREACTIVE
Hematocrit: 28.5 % — ABNORMAL LOW (ref 34.0–46.6)
Hemoglobin: 9.4 g/dL — ABNORMAL LOW (ref 11.1–15.9)
Immature Grans (Abs): 0 10*3/uL (ref 0.0–0.1)
Immature Granulocytes: 1 %
Lymphocytes Absolute: 1.9 10*3/uL (ref 0.7–3.1)
Lymphs: 31 %
MCH: 26.9 pg (ref 26.6–33.0)
MCHC: 33 g/dL (ref 31.5–35.7)
MCV: 82 fL (ref 79–97)
Monocytes Absolute: 0.4 10*3/uL (ref 0.1–0.9)
Monocytes: 7 %
Neutrophils Absolute: 3.8 10*3/uL (ref 1.4–7.0)
Neutrophils: 60 %
Platelets: 252 10*3/uL (ref 150–450)
RBC: 3.49 x10E6/uL — ABNORMAL LOW (ref 3.77–5.28)
RDW: 13.8 % (ref 11.7–15.4)
RPR Ser Ql: NONREACTIVE
WBC: 6.2 10*3/uL (ref 3.4–10.8)

## 2019-01-02 ENCOUNTER — Telehealth: Payer: Self-pay

## 2019-01-02 ENCOUNTER — Ambulatory Visit (INDEPENDENT_AMBULATORY_CARE_PROVIDER_SITE_OTHER): Payer: Medicaid Other | Admitting: Advanced Practice Midwife

## 2019-01-02 ENCOUNTER — Encounter: Payer: Self-pay | Admitting: Advanced Practice Midwife

## 2019-01-02 ENCOUNTER — Other Ambulatory Visit: Payer: Self-pay

## 2019-01-02 VITALS — BP 132/86 | Wt 248.0 lb

## 2019-01-02 DIAGNOSIS — Z3A33 33 weeks gestation of pregnancy: Secondary | ICD-10-CM

## 2019-01-02 DIAGNOSIS — O26893 Other specified pregnancy related conditions, third trimester: Secondary | ICD-10-CM

## 2019-01-02 DIAGNOSIS — R300 Dysuria: Secondary | ICD-10-CM | POA: Diagnosis not present

## 2019-01-02 LAB — POCT URINALYSIS DIPSTICK OB
Bilirubin, UA: NEGATIVE
Glucose, UA: NEGATIVE
Leukocytes, UA: NEGATIVE
Nitrite, UA: NEGATIVE
Spec Grav, UA: 1.025 (ref 1.010–1.025)
Urobilinogen, UA: NEGATIVE E.U./dL — AB
pH, UA: 6 (ref 5.0–8.0)

## 2019-01-02 NOTE — Patient Instructions (Signed)
Pregnancy and Urinary Tract Infection  What is a urinary tract infection?    A urinary tract infection (UTI) is an infection of any part of the urinary tract. This includes the kidneys, the tubes that connect your kidneys to your bladder (ureters), the bladder, and the tube that carries urine out of your body (urethra). These organs make, store, and get rid of urine in the body.   An upper UTI affects the ureters and kidneys (pyelonephritis), and a lower UTI affects the bladder (cystitis) and urethra (urethritis). Most urinary tract infections are caused by bacteria in your genital area, around the entrance to your urinary tract (urethra). These bacteria grow and cause irritation and inflammation of your urinary tract.  Why am I more likely to get a UTI during pregnancy?  You are more likely to develop a UTI during pregnancy because:  · The physical and hormonal changes your body goes through can make it easier for bacteria to get into your urinary tract.  · Your growing baby puts pressure on your uterus and can affect urine flow.  Does a UTI place my baby at risk?  An untreated UTI during pregnancy could lead to a kidney infection, which can cause health problems that could affect your baby. Possible complications of an untreated UTI include:  · Having your baby before 37 weeks of pregnancy (premature).  · Having a baby with a low birth weight.  · Developing high blood pressure during pregnancy (preeclampsia).  · Having a low hemoglobin level (anemia).  What are the symptoms of a UTI?  Symptoms of a UTI include:  · Needing to urinate right away (urgently).  · Frequent urination or passing small amounts of urine frequently.  · Pain or burning with urination.  · Blood in the urine.  · Urine that smells bad or unusual.  · Trouble urinating.  · Cloudy urine.  · Pain in the abdomen or lower back.  · Vaginal discharge.  You may also have:  · Vomiting or a decreased appetite.  · Confusion.  · Irritability or  tiredness.  · A fever.  · Diarrhea.  What are the treatment options for a UTI during pregnancy?  Treatment for this condition may include:  · Antibiotic medicines that are safe to take during pregnancy.  · Other medicines to treat less common causes of UTI.  How can I prevent a UTI?  To prevent a UTI:  · Go to the bathroom as soon as you feel the need. Do not hold urine for long periods of time.  · Always wipe from front to back after a bowel movement. Use each tissue one time when you wipe.  · Empty your bladder after sex.  · Keep your genital area dry.  · Drink 6-10 glasses of water each day.  · Do not douche or use deodorant sprays.  Contact a health care provider if:  · Your symptoms do not improve or they get worse.  · You have abnormal vaginal discharge.  Get help right away if:  · You have a fever.  · You have nausea and vomiting.  · You have back or side pain.  · You feel contractions in your uterus.  · You have lower belly pain.  · You have a gush of fluid from your vagina.  · You have blood in your urine.  Summary  · A urinary tract infection (UTI) is an infection of any part of the urinary tract, which includes the kidneys, ureters,   bladder, and urethra.  · Most urinary tract infections are caused by bacteria in your genital area, around the entrance to your urinary tract (urethra).  · You are more likely to develop a UTI during pregnancy.  · If you were prescribed an antibiotic, take it as told by your health care provider. Do not stop taking the antibiotic even if you start to feel better.  This information is not intended to replace advice given to you by your health care provider. Make sure you discuss any questions you have with your health care provider.  Document Released: 01/08/2011 Document Revised: 11/08/2017 Document Reviewed: 08/04/2015  Elsevier Interactive Patient Education © 2019 [employer]

## 2019-01-02 NOTE — Progress Notes (Signed)
Abdominal pain and pressure urinating, no blood in urine, feels the need for bowel movement evertime she goes urinate x last night 10 pm

## 2019-01-02 NOTE — Progress Notes (Addendum)
  Routine Prenatal Care Visit  Subjective  Sierra Jordan is a 28 y.o. [id number] at [redacted]w[redacted]d being seen today for ongoing prenatal care.  She is currently monitored for the following issues for this high-risk pregnancy and has Hyperemesis gravidarum with dehydration; Supervision of high risk pregnancy, antepartum; BMI 37.0-37.9, adult; and Gestational hypertension on their problem list.  ----------------------------------------------------------------------------------- Patient reports low back and low abdominal pain since last night lasting for 10-15 minutes at a time. She feels pressure each time she has to urinate. She denies any other s/s of illness- no fever, itching, irritation, odor. She has normal vaginal discharge. She denies headache or visual changes or epigastric pain. She admits drinking 7-8 bottles of water per day and yet her urine is dark yellow. Discussed the importance of staying well hydrated for kidney function and to avoid pre-term cramping.    Contractions: Irregular. Vag. Bleeding: None.  Movement: Present. Denies leaking of fluid.  ----------------------------------------------------------------------------------- The following portions of the patient's history were reviewed and updated as appropriate: allergies, current medications, past family history, past medical history, past social history, past surgical history and problem list. Problem list updated.   Objective  Blood pressure 132/86, weight 248 lb (112.5 kg), last menstrual period 05/12/2018 Pregravid weight 220 lb (99.8 kg) Total Weight Gain 28 lb (12.7 kg) Urinalysis: Urine Protein 4+  Urine Glucose Negative  Results for Sierra Jordan, Sierra Jordan (MRN [id number]) as of 01/02/2019 16:27  Ref. Range 01/02/2019 16:11  Bilirubin, UA Unknown neg  Glucose, UA Latest Ref Range: Negative  Negative  Ketones, UA Unknown 3+  Leukocytes,UA Latest Ref Range: Negative  Negative  Nitrite, UA Unknown neg  pH, UA Latest Ref Range: 5.0 - 8.0   6.0  Protein Latest Ref Range: Negative, Trace, Small (1+), Moderate (2+), Large (3+), 4+  4+  Specific Gravity, UA Latest Ref Range: 1.010 - 1.025  1.025  Urobilinogen, UA Latest Ref Range: 0.2 or 1.0 E.U./dL negative (A)  RBC, UA Unknown 2+    Fetal Status: Fetal Heart Rate (bpm): 148   Movement: Present  Presentation: Vertex  General:  Alert, oriented and cooperative. Patient is in no acute distress.  Skin: Skin is warm and dry. No rash noted.   Cardiovascular: Normal heart rate noted  Respiratory: Normal respiratory effort, no problems with respiration noted  Abdomen: Soft, gravid, appropriate for gestational age. Pain/Pressure: Present     Pelvic:  Cervical exam performed Dilation: 1 Effacement (%): 40 Station: -3  Extremities: Normal range of motion.     Mental Status: Normal mood and affect. Normal behavior. Normal judgment and thought content.   Assessment   28 y.o. [id number] at [redacted]w[redacted]d by  02/16/2019, by Last Menstrual Period presenting for work-in prenatal visit  Plan   pregnancy Problems (from 10/12/18 to present)    No problems associated with this episode.       Preterm labor symptoms and general obstetric precautions including but not limited to vaginal bleeding, contractions, leaking of fluid and fetal movement were reviewed in detail with the patient.   Increase hydration until urine is clear to light yellow Soak in bathtub Take tylenol PM for rest Please refer to After Visit Summary for other counseling recommendations.   Urine culture sent- follow up as needed Return for has follow up scheduled.  Tresea Mall, CNM 01/02/2019 4:20 PM

## 2019-01-02 NOTE — Telephone Encounter (Signed)
Pt calling; is 34wks; having B-H since last night - not sure B-H. [phone] Pt states has pressure when urinating. Last BM was this am. Pain in lower abd and hips. Tx'd to SP to schedule.

## 2019-01-04 LAB — URINE CULTURE

## 2019-01-10 ENCOUNTER — Encounter: Payer: Medicaid Other | Admitting: Obstetrics & Gynecology

## 2019-01-12 ENCOUNTER — Inpatient Hospital Stay
Admission: EM | Admit: 2019-01-12 | Discharge: 2019-01-16 | DRG: 787 | Disposition: A | Discharge status: Home / Self Care | Payer: Medicaid Other | Attending: Obstetrics and Gynecology | Admitting: Obstetrics and Gynecology

## 2019-01-12 ENCOUNTER — Other Ambulatory Visit: Payer: Self-pay

## 2019-01-12 DIAGNOSIS — E861 Hypovolemia: Secondary | ICD-10-CM | POA: Diagnosis present

## 2019-01-12 DIAGNOSIS — O0991 Supervision of high risk pregnancy, unspecified, first trimester: Secondary | ICD-10-CM

## 2019-01-12 DIAGNOSIS — O0990 Supervision of high risk pregnancy, unspecified, unspecified trimester: Secondary | ICD-10-CM

## 2019-01-12 DIAGNOSIS — O458X3 Other premature separation of placenta, third trimester: Secondary | ICD-10-CM | POA: Diagnosis present

## 2019-01-12 DIAGNOSIS — O9081 Anemia of the puerperium: Secondary | ICD-10-CM | POA: Diagnosis not present

## 2019-01-12 DIAGNOSIS — O26833 Pregnancy related renal disease, third trimester: Secondary | ICD-10-CM | POA: Diagnosis present

## 2019-01-12 DIAGNOSIS — F17200 Nicotine dependence, unspecified, uncomplicated: Secondary | ICD-10-CM | POA: Diagnosis present

## 2019-01-12 DIAGNOSIS — D696 Thrombocytopenia, unspecified: Secondary | ICD-10-CM | POA: Diagnosis present

## 2019-01-12 DIAGNOSIS — O99284 Endocrine, nutritional and metabolic diseases complicating childbirth: Secondary | ICD-10-CM | POA: Diagnosis present

## 2019-01-12 DIAGNOSIS — D72829 Elevated white blood cell count, unspecified: Secondary | ICD-10-CM | POA: Diagnosis present

## 2019-01-12 DIAGNOSIS — J189 Pneumonia, unspecified organism: Secondary | ICD-10-CM

## 2019-01-12 DIAGNOSIS — O99344 Other mental disorders complicating childbirth: Secondary | ICD-10-CM | POA: Diagnosis present

## 2019-01-12 DIAGNOSIS — D62 Acute posthemorrhagic anemia: Secondary | ICD-10-CM | POA: Diagnosis not present

## 2019-01-12 DIAGNOSIS — R740 Nonspecific elevation of levels of transaminase and lactic acid dehydrogenase [LDH]: Secondary | ICD-10-CM | POA: Diagnosis present

## 2019-01-12 DIAGNOSIS — Z3A35 35 weeks gestation of pregnancy: Secondary | ICD-10-CM

## 2019-01-12 DIAGNOSIS — O9912 Other diseases of the blood and blood-forming organs and certain disorders involving the immune mechanism complicating childbirth: Secondary | ICD-10-CM | POA: Diagnosis present

## 2019-01-12 DIAGNOSIS — N179 Acute kidney failure, unspecified: Secondary | ICD-10-CM | POA: Diagnosis present

## 2019-01-12 DIAGNOSIS — O364XX0 Maternal care for intrauterine death, not applicable or unspecified: Secondary | ICD-10-CM | POA: Diagnosis present

## 2019-01-12 DIAGNOSIS — O99334 Smoking (tobacco) complicating childbirth: Secondary | ICD-10-CM | POA: Diagnosis present

## 2019-01-12 DIAGNOSIS — O4593 Premature separation of placenta, unspecified, third trimester: Secondary | ICD-10-CM | POA: Diagnosis not present

## 2019-01-12 DIAGNOSIS — O1414 Severe pre-eclampsia complicating childbirth: Secondary | ICD-10-CM | POA: Diagnosis present

## 2019-01-12 DIAGNOSIS — O99892 Other specified diseases and conditions complicating childbirth: Secondary | ICD-10-CM

## 2019-01-12 DIAGNOSIS — O1495 Unspecified pre-eclampsia, complicating the puerperium: Secondary | ICD-10-CM | POA: Diagnosis not present

## 2019-01-12 DIAGNOSIS — F419 Anxiety disorder, unspecified: Secondary | ICD-10-CM | POA: Diagnosis present

## 2019-01-12 DIAGNOSIS — R109 Unspecified abdominal pain: Secondary | ICD-10-CM | POA: Diagnosis present

## 2019-01-12 DIAGNOSIS — O0993 Supervision of high risk pregnancy, unspecified, third trimester: Secondary | ICD-10-CM

## 2019-01-12 LAB — CBC
HCT: 30.8 % — ABNORMAL LOW (ref 36.0–46.0)
Hemoglobin: 10.5 g/dL — ABNORMAL LOW (ref 12.0–15.0)
MCH: 26.4 pg (ref 26.0–34.0)
MCHC: 34.1 g/dL (ref 30.0–36.0)
MCV: 77.4 fL — ABNORMAL LOW (ref 80.0–100.0)
Platelets: 237 10*3/uL (ref 150–400)
RBC: 3.98 MIL/uL (ref 3.87–5.11)
RDW: 14.6 % (ref 11.5–15.5)
WBC: 12.2 10*3/uL — ABNORMAL HIGH (ref 4.0–10.5)
nRBC: 0.2 % (ref 0.0–0.2)

## 2019-01-12 MED ORDER — LACTATED RINGERS IV SOLN: INTRAVENOUS | Status: DC

## 2019-01-12 MED ORDER — LACTATED RINGERS IV SOLN
500.0000 mL | INTRAVENOUS | Status: DC | PRN
  Administered 2019-01-13: 1000 mL via INTRAVENOUS
  Administered 2019-01-13: 500 mL via INTRAVENOUS

## 2019-01-12 MED ORDER — SOD CITRATE-CITRIC ACID 500-334 MG/5ML PO SOLN
30.0000 mL | ORAL | Status: DC | PRN
  Filled 2019-01-12: qty 15

## 2019-01-12 MED ORDER — LIDOCAINE HCL (PF) 1 % IJ SOLN: 30.0000 mL | INTRAMUSCULAR | Status: DC | PRN

## 2019-01-12 MED ORDER — OXYTOCIN BOLUS FROM INFUSION: 500.0000 mL | Freq: Once | INTRAVENOUS | Status: DC

## 2019-01-12 MED ORDER — OXYTOCIN 10 UNIT/ML IJ SOLN: 10.0000 [IU] | Freq: Once | INTRAMUSCULAR | Status: DC

## 2019-01-12 MED ORDER — OXYTOCIN 40 UNITS IN NORMAL SALINE INFUSION - SIMPLE MED
2.5000 [IU]/h | INTRAVENOUS | Status: DC
  Administered 2019-01-13: 100 mL via INTRAVENOUS
  Administered 2019-01-13: 300 mL via INTRAVENOUS
  Filled 2019-01-12 (×2): qty 1000

## 2019-01-12 MED ORDER — ONDANSETRON HCL 4 MG/2ML IJ SOLN: 4.0000 mg | Freq: Four times a day (QID) | INTRAMUSCULAR | Status: DC | PRN

## 2019-01-13 ENCOUNTER — Encounter
Admission: EM | Disposition: A | Discharge status: Home / Self Care | Payer: Self-pay | Source: Home / Self Care | Attending: Obstetrics and Gynecology

## 2019-01-13 ENCOUNTER — Inpatient Hospital Stay: Payer: Medicaid Other

## 2019-01-13 ENCOUNTER — Inpatient Hospital Stay: Payer: Medicaid Other | Admitting: Registered Nurse

## 2019-01-13 ENCOUNTER — Encounter: Payer: Self-pay | Admitting: Anesthesiology

## 2019-01-13 DIAGNOSIS — N179 Acute kidney failure, unspecified: Secondary | ICD-10-CM | POA: Diagnosis present

## 2019-01-13 DIAGNOSIS — Z3A35 35 weeks gestation of pregnancy: Secondary | ICD-10-CM

## 2019-01-13 DIAGNOSIS — O4593 Premature separation of placenta, unspecified, third trimester: Secondary | ICD-10-CM

## 2019-01-13 LAB — CBC
HCT: 23.6 % — ABNORMAL LOW (ref 36.0–46.0)
HCT: 29.3 % — ABNORMAL LOW (ref 36.0–46.0)
HCT: 24 % — ABNORMAL LOW (ref 36.0–46.0)
Hemoglobin: 8 g/dL — ABNORMAL LOW (ref 12.0–15.0)
Hemoglobin: 9.9 g/dL — ABNORMAL LOW (ref 12.0–15.0)
Hemoglobin: 8.5 g/dL — ABNORMAL LOW (ref 12.0–15.0)
MCH: 27.3 pg (ref 26.0–34.0)
MCH: 27.7 pg (ref 26.0–34.0)
MCH: 28.1 pg (ref 26.0–34.0)
MCHC: 33.9 g/dL (ref 30.0–36.0)
MCHC: 33.8 g/dL (ref 30.0–36.0)
MCHC: 35.4 g/dL (ref 30.0–36.0)
MCV: 80.5 fL (ref 80.0–100.0)
MCV: 82.1 fL (ref 80.0–100.0)
MCV: 79.5 fL — ABNORMAL LOW (ref 80.0–100.0)
Platelets: 123 10*3/uL — ABNORMAL LOW (ref 150–400)
Platelets: 114 10*3/uL — ABNORMAL LOW (ref 150–400)
Platelets: 102 10*3/uL — ABNORMAL LOW (ref 150–400)
RBC: 2.93 MIL/uL — ABNORMAL LOW (ref 3.87–5.11)
RBC: 3.57 MIL/uL — ABNORMAL LOW (ref 3.87–5.11)
RBC: 3.02 MIL/uL — ABNORMAL LOW (ref 3.87–5.11)
RDW: 15.1 % (ref 11.5–15.5)
RDW: 15.5 % (ref 11.5–15.5)
RDW: 14.9 % (ref 11.5–15.5)
WBC: 22.5 10*3/uL — ABNORMAL HIGH (ref 4.0–10.5)
WBC: 25.4 10*3/uL — ABNORMAL HIGH (ref 4.0–10.5)
WBC: 20.3 10*3/uL — ABNORMAL HIGH (ref 4.0–10.5)
nRBC: 0.1 % (ref 0.0–0.2)
nRBC: 0.1 % (ref 0.0–0.2)
nRBC: 0.1 % (ref 0.0–0.2)

## 2019-01-13 LAB — URINE DRUG SCREEN, QUALITATIVE (ARMC ONLY)
Amphetamines, Ur Screen: NOT DETECTED
Barbiturates, Ur Screen: NOT DETECTED
Benzodiazepine, Ur Scrn: NOT DETECTED
Cannabinoid 50 Ng, Ur ~~LOC~~: POSITIVE — AB
Cocaine Metabolite,Ur ~~LOC~~: NOT DETECTED
MDMA (Ecstasy)Ur Screen: NOT DETECTED
Methadone Scn, Ur: NOT DETECTED
Opiate, Ur Screen: NOT DETECTED
Phencyclidine (PCP) Ur S: NOT DETECTED
Tricyclic, Ur Screen: NOT DETECTED

## 2019-01-13 LAB — COMPREHENSIVE METABOLIC PANEL
ALT: 31 U/L (ref 0–44)
ALT: 30 U/L (ref 0–44)
ALT: 31 U/L (ref 0–44)
AST: 45 U/L — ABNORMAL HIGH (ref 15–41)
AST: 66 U/L — ABNORMAL HIGH (ref 15–41)
AST: 65 U/L — ABNORMAL HIGH (ref 15–41)
Albumin: 2.4 g/dL — ABNORMAL LOW (ref 3.5–5.0)
Albumin: 2.1 g/dL — ABNORMAL LOW (ref 3.5–5.0)
Albumin: 1.8 g/dL — ABNORMAL LOW (ref 3.5–5.0)
Alkaline Phosphatase: 160 U/L — ABNORMAL HIGH (ref 38–126)
Alkaline Phosphatase: 125 U/L (ref 38–126)
Alkaline Phosphatase: 101 U/L (ref 38–126)
Anion gap: 11 (ref 5–15)
Anion gap: 10 (ref 5–15)
Anion gap: 8 (ref 5–15)
BUN: 20 mg/dL (ref 6–20)
BUN: 23 mg/dL — ABNORMAL HIGH (ref 6–20)
BUN: 27 mg/dL — ABNORMAL HIGH (ref 6–20)
CO2: 17 mmol/L — ABNORMAL LOW (ref 22–32)
CO2: 19 mmol/L — ABNORMAL LOW (ref 22–32)
CO2: 20 mmol/L — ABNORMAL LOW (ref 22–32)
Calcium: 7.9 mg/dL — ABNORMAL LOW (ref 8.9–10.3)
Calcium: 7.7 mg/dL — ABNORMAL LOW (ref 8.9–10.3)
Calcium: 7.2 mg/dL — ABNORMAL LOW (ref 8.9–10.3)
Chloride: 107 mmol/L (ref 98–111)
Chloride: 107 mmol/L (ref 98–111)
Chloride: 108 mmol/L (ref 98–111)
Creatinine, Ser: 1.22 mg/dL — ABNORMAL HIGH (ref 0.44–1.00)
Creatinine, Ser: 1.92 mg/dL — ABNORMAL HIGH (ref 0.44–1.00)
Creatinine, Ser: 2.15 mg/dL — ABNORMAL HIGH (ref 0.44–1.00)
GFR calc Af Amer: >60 mL/min (ref >60)
GFR calc Af Amer: 40 mL/min — ABNORMAL LOW (ref >60)
GFR calc Af Amer: 35 mL/min — ABNORMAL LOW (ref >60)
GFR calc non Af Amer: >60 mL/min (ref >60)
GFR calc non Af Amer: 35 mL/min — ABNORMAL LOW (ref >60)
GFR calc non Af Amer: 30 mL/min — ABNORMAL LOW (ref >60)
Glucose, Bld: 138 mg/dL — ABNORMAL HIGH (ref 70–99)
Glucose, Bld: 169 mg/dL — ABNORMAL HIGH (ref 70–99)
Glucose, Bld: 139 mg/dL — ABNORMAL HIGH (ref 70–99)
Potassium: 3.6 mmol/L (ref 3.5–5.1)
Potassium: 5.2 mmol/L — ABNORMAL HIGH (ref 3.5–5.1)
Potassium: 5.3 mmol/L — ABNORMAL HIGH (ref 3.5–5.1)
Sodium: 135 mmol/L (ref 135–145)
Sodium: 136 mmol/L (ref 135–145)
Sodium: 136 mmol/L (ref 135–145)
Total Bilirubin: 0.6 mg/dL (ref 0.3–1.2)
Total Bilirubin: 1.4 mg/dL — ABNORMAL HIGH (ref 0.3–1.2)
Total Bilirubin: 0.6 mg/dL (ref 0.3–1.2)
Total Protein: 5.8 g/dL — ABNORMAL LOW (ref 6.5–8.1)
Total Protein: 5.4 g/dL — ABNORMAL LOW (ref 6.5–8.1)
Total Protein: 4.7 g/dL — ABNORMAL LOW (ref 6.5–8.1)

## 2019-01-13 LAB — PROTEIN / CREATININE RATIO, URINE
Creatinine, Urine: 462 mg/dL
Total Protein, Urine: >3000 mg/dL

## 2019-01-13 LAB — LACTATE DEHYDROGENASE
LDH: 687 U/L — ABNORMAL HIGH (ref 98–192)
LDH: 640 U/L — ABNORMAL HIGH (ref 98–192)

## 2019-01-13 LAB — PROTIME-INR
INR: 1.2 (ref 0.8–1.2)
Prothrombin Time: 14.6 seconds (ref 11.4–15.2)

## 2019-01-13 LAB — KLEIHAUER-BETKE STAIN
Fetal Cells %: 0 %
Quantitation Fetal Hemoglobin: 0 mL

## 2019-01-13 LAB — MAGNESIUM: Magnesium: 3.7 mg/dL — ABNORMAL HIGH (ref 1.7–2.4)

## 2019-01-13 LAB — APTT: aPTT: 34 seconds (ref 24–36)

## 2019-01-13 LAB — PREPARE RBC (CROSSMATCH)

## 2019-01-13 LAB — FIBRINOGEN: Fibrinogen: 200 mg/dL — ABNORMAL LOW (ref 210–475)

## 2019-01-13 SURGERY — Surgical Case
Anesthesia: Epidural | Site: Abdomen

## 2019-01-13 MED ORDER — OXYCODONE HCL 5 MG PO TABS
5.0000 mg | ORAL_TABLET | Freq: Four times a day (QID) | ORAL | Status: AC | PRN
  Administered 2019-01-14: 5 mg via ORAL
  Filled 2019-01-13: qty 1

## 2019-01-13 MED ORDER — LIDOCAINE 2% (20 MG/ML) 5 ML SYRINGE
INTRAMUSCULAR | Status: DC | PRN
  Administered 2019-01-13 (×3): 100 mg via INTRAVENOUS

## 2019-01-13 MED ORDER — PHENYLEPHRINE HCL (PRESSORS) 10 MG/ML IV SOLN
INTRAVENOUS | Status: AC
  Filled 2019-01-13: qty 1

## 2019-01-13 MED ORDER — BUPIVACAINE HCL (PF) 0.25 % IJ SOLN
INTRAMUSCULAR | Status: DC | PRN
  Administered 2019-01-13: 2 mL via EPIDURAL
  Administered 2019-01-13: 4 mL via EPIDURAL

## 2019-01-13 MED ORDER — LACTATED RINGERS IV BOLUS
1000.0000 mL | INTRAVENOUS | Status: DC
  Administered 2019-01-13: 1000 mL via INTRAVENOUS

## 2019-01-13 MED ORDER — LIDOCAINE HCL (PF) 1 % IJ SOLN
INTRAMUSCULAR | Status: AC
  Filled 2019-01-13: qty 30

## 2019-01-13 MED ORDER — LIDOCAINE-EPINEPHRINE (PF) 1.5 %-1:200000 IJ SOLN
INTRAMUSCULAR | Status: DC | PRN
  Administered 2019-01-13: 3 mL via EPIDURAL

## 2019-01-13 MED ORDER — MORPHINE SULFATE (PF) 0.5 MG/ML IJ SOLN
INTRAMUSCULAR | Status: AC
  Filled 2019-01-13: qty 10

## 2019-01-13 MED ORDER — DIPHENHYDRAMINE HCL 50 MG/ML IJ SOLN: 12.5000 mg | INTRAMUSCULAR | Status: DC | PRN

## 2019-01-13 MED ORDER — MAGNESIUM SULFATE BOLUS VIA INFUSION
4.0000 g | Freq: Once | INTRAVENOUS | Status: AC
  Administered 2019-01-13: 4 g via INTRAVENOUS
  Filled 2019-01-13: qty 500

## 2019-01-13 MED ORDER — FENTANYL 2.5 MCG/ML W/ROPIVACAINE 0.15% IN NS 100 ML EPIDURAL (ARMC)
EPIDURAL | Status: AC
  Filled 2019-01-13: qty 100

## 2019-01-13 MED ORDER — LACTATED RINGERS IV SOLN
INTRAVENOUS | Status: DC
  Administered 2019-01-13: 11:00:00 via INTRAVENOUS

## 2019-01-13 MED ORDER — PROPOFOL 10 MG/ML IV BOLUS
INTRAVENOUS | Status: AC
  Filled 2019-01-13: qty 20

## 2019-01-13 MED ORDER — LABETALOL HCL 5 MG/ML IV SOLN
40.0000 mg | INTRAVENOUS | Status: DC | PRN
  Administered 2019-01-13 – 2019-01-14 (×2): 40 mg via INTRAVENOUS
  Filled 2019-01-13 (×2): qty 8

## 2019-01-13 MED ORDER — MEPERIDINE HCL 25 MG/ML IJ SOLN
INTRAMUSCULAR | Status: AC
  Filled 2019-01-13: qty 1

## 2019-01-13 MED ORDER — DIPHENHYDRAMINE HCL 25 MG PO CAPS: 25.0000 mg | ORAL_CAPSULE | Freq: Four times a day (QID) | ORAL | Status: DC | PRN

## 2019-01-13 MED ORDER — EPHEDRINE 5 MG/ML INJ: 10.0000 mg | INTRAVENOUS | Status: DC | PRN

## 2019-01-13 MED ORDER — MIDAZOLAM HCL 2 MG/2ML IJ SOLN
INTRAMUSCULAR | Status: AC
  Filled 2019-01-13: qty 2

## 2019-01-13 MED ORDER — WITCH HAZEL-GLYCERIN EX PADS: 1.0000 "application " | MEDICATED_PAD | CUTANEOUS | Status: DC | PRN

## 2019-01-13 MED ORDER — LABETALOL HCL 5 MG/ML IV SOLN: 80.0000 mg | INTRAVENOUS | Status: DC | PRN

## 2019-01-13 MED ORDER — CARBOPROST TROMETHAMINE 250 MCG/ML IM SOLN
INTRAMUSCULAR | Status: AC
  Filled 2019-01-13: qty 1

## 2019-01-13 MED ORDER — OXYTOCIN 10 UNIT/ML IJ SOLN
INTRAMUSCULAR | Status: AC
  Filled 2019-01-13: qty 2

## 2019-01-13 MED ORDER — MISOPROSTOL 200 MCG PO TABS
ORAL_TABLET | ORAL | Status: AC
  Filled 2019-01-13: qty 4

## 2019-01-13 MED ORDER — NALBUPHINE HCL 10 MG/ML IJ SOLN: 5.0000 mg | INTRAMUSCULAR | Status: DC | PRN

## 2019-01-13 MED ORDER — SODIUM CHLORIDE 0.9% IV SOLUTION
Freq: Once | INTRAVENOUS | Status: AC
  Administered 2019-01-13: 15:00:00 via INTRAVENOUS

## 2019-01-13 MED ORDER — LABETALOL HCL 5 MG/ML IV SOLN
INTRAVENOUS | Status: AC
  Filled 2019-01-13: qty 4

## 2019-01-13 MED ORDER — SODIUM CHLORIDE 0.9 % IV SOLN
1.0000 g | Freq: Three times a day (TID) | INTRAVENOUS | Status: AC
  Administered 2019-01-13 – 2019-01-14 (×5): 1 g via INTRAVENOUS
  Filled 2019-01-13 (×7): qty 1

## 2019-01-13 MED ORDER — MORPHINE SULFATE-NACL 0.5-0.9 MG/ML-% IV SOSY
PREFILLED_SYRINGE | INTRAVENOUS | Status: DC | PRN
  Administered 2019-01-13: 3 mg via EPIDURAL

## 2019-01-13 MED ORDER — NALBUPHINE HCL 10 MG/ML IJ SOLN: 5.0000 mg | Freq: Once | INTRAMUSCULAR | Status: DC | PRN

## 2019-01-13 MED ORDER — LACTATED RINGERS IV SOLN: INTRAVENOUS | Status: DC

## 2019-01-13 MED ORDER — SODIUM CHLORIDE 0.9% IV SOLUTION: Freq: Once | INTRAVENOUS | Status: DC

## 2019-01-13 MED ORDER — FENTANYL 2.5 MCG/ML W/ROPIVACAINE 0.15% IN NS 100 ML EPIDURAL (ARMC): 12.0000 mL/h | EPIDURAL | Status: DC

## 2019-01-13 MED ORDER — FENTANYL 2.5 MCG/ML W/ROPIVACAINE 0.15% IN NS 100 ML EPIDURAL (ARMC)
EPIDURAL | Status: DC | PRN
  Administered 2019-01-13: 12 mL/h via EPIDURAL

## 2019-01-13 MED ORDER — CALCIUM GLUCONATE 10 % IV SOLN
INTRAVENOUS | Status: AC
  Filled 2019-01-13: qty 10

## 2019-01-13 MED ORDER — SODIUM CHLORIDE FLUSH 0.9 % IV SOLN
INTRAVENOUS | Status: AC
  Filled 2019-01-13: qty 10

## 2019-01-13 MED ORDER — COCONUT OIL OIL: 1.0000 "application " | TOPICAL_OIL | Status: DC | PRN

## 2019-01-13 MED ORDER — METHYLERGONOVINE MALEATE 0.2 MG/ML IJ SOLN
INTRAMUSCULAR | Status: AC
  Filled 2019-01-13: qty 1

## 2019-01-13 MED ORDER — SIMETHICONE 80 MG PO CHEW
80.0000 mg | CHEWABLE_TABLET | Freq: Three times a day (TID) | ORAL | Status: DC
  Administered 2019-01-15 – 2019-01-16 (×6): 80 mg via ORAL
  Filled 2019-01-13 (×6): qty 1

## 2019-01-13 MED ORDER — HYDROMORPHONE HCL 1 MG/ML IJ SOLN
0.5000 mg | INTRAMUSCULAR | Status: DC | PRN
  Administered 2019-01-13: 0.5 mg via INTRAVENOUS
  Filled 2019-01-13: qty 1

## 2019-01-13 MED ORDER — FENTANYL CITRATE (PF) 100 MCG/2ML IJ SOLN
INTRAMUSCULAR | Status: AC
  Filled 2019-01-13: qty 2

## 2019-01-13 MED ORDER — OXYTOCIN 40 UNITS IN NORMAL SALINE INFUSION - SIMPLE MED
2.5000 [IU]/h | INTRAVENOUS | Status: AC
  Administered 2019-01-13: 2.5 [IU]/h via INTRAVENOUS
  Filled 2019-01-13: qty 1000

## 2019-01-13 MED ORDER — LACTATED RINGERS IV SOLN
500.0000 mL | Freq: Once | INTRAVENOUS | Status: AC
  Administered 2019-01-13: 500 mL via INTRAVENOUS

## 2019-01-13 MED ORDER — FENTANYL CITRATE (PF) 100 MCG/2ML IJ SOLN
INTRAMUSCULAR | Status: DC | PRN
  Administered 2019-01-13: 100 ug via EPIDURAL

## 2019-01-13 MED ORDER — ONDANSETRON HCL 4 MG/2ML IJ SOLN
INTRAMUSCULAR | Status: AC
  Filled 2019-01-13: qty 2

## 2019-01-13 MED ORDER — MISOPROSTOL 200 MCG PO TABS
ORAL_TABLET | ORAL | Status: AC
  Administered 2019-01-13: 800 ug
  Filled 2019-01-13: qty 4

## 2019-01-13 MED ORDER — PRENATAL MULTIVITAMIN CH
1.0000 | ORAL_TABLET | Freq: Every day | ORAL | Status: DC
  Administered 2019-01-14 – 2019-01-16 (×3): 1 via ORAL
  Filled 2019-01-13 (×3): qty 1

## 2019-01-13 MED ORDER — NICOTINE 21 MG/24HR TD PT24
21.0000 mg | MEDICATED_PATCH | Freq: Every day | TRANSDERMAL | Status: DC
  Filled 2019-01-13: qty 1

## 2019-01-13 MED ORDER — PHENYLEPHRINE 40 MCG/ML (10ML) SYRINGE FOR IV PUSH (FOR BLOOD PRESSURE SUPPORT): 80.0000 ug | PREFILLED_SYRINGE | INTRAVENOUS | Status: DC | PRN

## 2019-01-13 MED ORDER — SODIUM CHLORIDE 0.9 % IV SOLN
INTRAVENOUS | Status: DC
  Administered 2019-01-13 – 2019-01-15 (×5): via INTRAVENOUS

## 2019-01-13 MED ORDER — MAGNESIUM SULFATE 40 G IN LACTATED RINGERS - SIMPLE
1.0000 g/h | INTRAVENOUS | Status: DC
  Filled 2019-01-13: qty 500

## 2019-01-13 MED ORDER — LIDOCAINE HCL (PF) 2 % IJ SOLN
INTRAMUSCULAR | Status: AC
  Filled 2019-01-13: qty 40

## 2019-01-13 MED ORDER — SENNOSIDES-DOCUSATE SODIUM 8.6-50 MG PO TABS
2.0000 | ORAL_TABLET | ORAL | Status: DC
  Administered 2019-01-14 – 2019-01-16 (×3): 2 via ORAL
  Filled 2019-01-13 (×3): qty 2

## 2019-01-13 MED ORDER — DIBUCAINE (PERIANAL) 1 % EX OINT: 1.0000 "application " | TOPICAL_OINTMENT | CUTANEOUS | Status: DC | PRN

## 2019-01-13 MED ORDER — LABETALOL HCL 5 MG/ML IV SOLN
20.0000 mg | Freq: Once | INTRAVENOUS | Status: DC
  Filled 2019-01-13: qty 4

## 2019-01-13 MED ORDER — LIDOCAINE HCL (PF) 1 % IJ SOLN
INTRAMUSCULAR | Status: DC | PRN
  Administered 2019-01-13: 3 mL via SUBCUTANEOUS

## 2019-01-13 MED ORDER — DEXTROSE 5 % IV SOLN
3.0000 g | INTRAVENOUS | Status: AC
  Administered 2019-01-13: 3 g via INTRAVENOUS
  Filled 2019-01-13: qty 3

## 2019-01-13 MED ORDER — SOD CITRATE-CITRIC ACID 500-334 MG/5ML PO SOLN
30.0000 mL | ORAL | Status: AC
  Administered 2019-01-13: 30 mL via ORAL

## 2019-01-13 MED ORDER — MIDAZOLAM HCL 2 MG/2ML IJ SOLN
INTRAMUSCULAR | Status: DC | PRN
  Administered 2019-01-13: 2 mg via INTRAVENOUS

## 2019-01-13 MED ORDER — ACETAMINOPHEN 325 MG PO TABS
650.0000 mg | ORAL_TABLET | Freq: Four times a day (QID) | ORAL | Status: AC
  Administered 2019-01-14: 650 mg via ORAL
  Filled 2019-01-13: qty 2

## 2019-01-13 MED ORDER — DIPHENHYDRAMINE HCL 25 MG PO CAPS: 25.0000 mg | ORAL_CAPSULE | ORAL | Status: DC | PRN

## 2019-01-13 MED ORDER — NALOXONE HCL 0.4 MG/ML IJ SOLN: 0.4000 mg | INTRAMUSCULAR | Status: DC | PRN

## 2019-01-13 MED ORDER — HYDRALAZINE HCL 20 MG/ML IJ SOLN
10.0000 mg | INTRAMUSCULAR | Status: DC | PRN
  Filled 2019-01-13: qty 1

## 2019-01-13 MED ORDER — AMMONIA AROMATIC IN INHA
RESPIRATORY_TRACT | Status: AC
  Filled 2019-01-13: qty 10

## 2019-01-13 MED ORDER — SODIUM CHLORIDE 0.9 % IV SOLN
INTRAVENOUS | Status: DC | PRN
  Administered 2019-01-13: 09:00:00 via INTRAVENOUS

## 2019-01-13 MED ORDER — KETOROLAC TROMETHAMINE 30 MG/ML IJ SOLN: 30.0000 mg | Freq: Four times a day (QID) | INTRAMUSCULAR | Status: DC

## 2019-01-13 MED ORDER — MENTHOL 3 MG MT LOZG
1.0000 | LOZENGE | OROMUCOSAL | Status: DC | PRN
  Filled 2019-01-13: qty 9

## 2019-01-13 MED ORDER — MEPERIDINE HCL 25 MG/ML IJ SOLN
6.2500 mg | INTRAMUSCULAR | Status: DC | PRN
  Administered 2019-01-13: 6.25 mg via INTRAVENOUS

## 2019-01-13 MED ORDER — FERROUS SULFATE 325 (65 FE) MG PO TABS
325.0000 mg | ORAL_TABLET | Freq: Two times a day (BID) | ORAL | Status: DC
  Administered 2019-01-14 – 2019-01-16 (×5): 325 mg via ORAL
  Filled 2019-01-13 (×5): qty 1

## 2019-01-13 MED ORDER — LABETALOL HCL 5 MG/ML IV SOLN
20.0000 mg | INTRAVENOUS | Status: DC | PRN
  Administered 2019-01-13 – 2019-01-15 (×5): 20 mg via INTRAVENOUS
  Filled 2019-01-13 (×2): qty 4

## 2019-01-13 MED ORDER — SODIUM CHLORIDE 0.9% FLUSH
3.0000 mL | INTRAVENOUS | Status: DC | PRN
  Administered 2019-01-15: 3 mL via INTRAVENOUS
  Filled 2019-01-13: qty 3

## 2019-01-13 MED ORDER — BUPIVACAINE HCL (PF) 0.5 % IJ SOLN
INTRAMUSCULAR | Status: AC
  Filled 2019-01-13: qty 30

## 2019-01-13 MED ORDER — PHENYLEPHRINE HCL (PRESSORS) 10 MG/ML IV SOLN
INTRAVENOUS | Status: DC | PRN
  Administered 2019-01-13: 50 ug via INTRAVENOUS
  Administered 2019-01-13: 100 ug via INTRAVENOUS
  Administered 2019-01-13: 50 ug via INTRAVENOUS

## 2019-01-13 MED ORDER — KETOROLAC TROMETHAMINE 30 MG/ML IJ SOLN
30.0000 mg | Freq: Four times a day (QID) | INTRAMUSCULAR | Status: DC
  Administered 2019-01-13: 30 mg via INTRAVENOUS
  Filled 2019-01-13: qty 1

## 2019-01-13 MED ORDER — ONDANSETRON HCL 4 MG/2ML IJ SOLN
4.0000 mg | Freq: Three times a day (TID) | INTRAMUSCULAR | Status: DC | PRN
  Administered 2019-01-13: 4 mg via INTRAVENOUS

## 2019-01-13 SURGICAL SUPPLY — 31 items
CANISTER SUCT 3000ML PPV (MISCELLANEOUS) ×2 IMPLANT
CATH KIT ON-Q SILVERSOAK 5IN (CATHETERS) ×4 IMPLANT
COVER WAND RF STERILE (DRAPES) ×2 IMPLANT
DERMABOND ADVANCED (GAUZE/BANDAGES/DRESSINGS) ×1
DERMABOND ADVANCED .7 DNX12 (GAUZE/BANDAGES/DRESSINGS) ×1 IMPLANT
DRESSING SURGICEL FIBRLLR 1X2 (HEMOSTASIS) ×1 IMPLANT
DRSG OPSITE POSTOP 4X10 (GAUZE/BANDAGES/DRESSINGS) ×2 IMPLANT
DRSG SURGICEL FIBRILLAR 1X2 (HEMOSTASIS) ×2
DRSG TELFA 3X8 NADH (GAUZE/BANDAGES/DRESSINGS) ×2 IMPLANT
ELECT CAUTERY BLADE 6.4 (BLADE) ×2 IMPLANT
ELECT REM PT RETURN 9FT ADLT (ELECTROSURGICAL) ×2
ELECTRODE REM PT RTRN 9FT ADLT (ELECTROSURGICAL) ×1 IMPLANT
GAUZE SPONGE 4X4 12PLY STRL (GAUZE/BANDAGES/DRESSINGS) ×2 IMPLANT
GLOVE BIO SURGEON STRL SZ7 (GLOVE) ×2 IMPLANT
GLOVE INDICATOR 7.5 STRL GRN (GLOVE) ×2 IMPLANT
GOWN STRL REUS W/ TWL LRG LVL3 (GOWN DISPOSABLE) ×3 IMPLANT
GOWN STRL REUS W/TWL LRG LVL3 (GOWN DISPOSABLE) ×3
NS IRRIG 1000ML POUR BTL (IV SOLUTION) ×2 IMPLANT
PACK C SECTION AR (MISCELLANEOUS) ×2 IMPLANT
PAD OB MATERNITY 4.3X12.25 (PERSONAL CARE ITEMS) ×4 IMPLANT
PAD PREP 24X41 OB/GYN DISP (PERSONAL CARE ITEMS) ×2 IMPLANT
PENCIL SMOKE ULTRAEVAC 22 CON (MISCELLANEOUS) ×2 IMPLANT
STRIP CLOSURE SKIN 1/2X4 (GAUZE/BANDAGES/DRESSINGS) ×2 IMPLANT
SUT MNCRL 4-0 (SUTURE) ×1
SUT MNCRL 4-0 27XMFL (SUTURE) ×1
SUT PDS AB 1 TP1 96 (SUTURE) ×2 IMPLANT
SUT PLAIN GUT 0 (SUTURE) IMPLANT
SUT VIC AB 0 CTX 36 (SUTURE) ×2
SUT VIC AB 0 CTX36XBRD ANBCTRL (SUTURE) ×2 IMPLANT
SUTURE MNCRL 4-0 27XMF (SUTURE) ×1 IMPLANT
SWABSTK COMLB BENZOIN TINCTURE (MISCELLANEOUS) ×2 IMPLANT

## 2019-01-13 NOTE — Anesthesia Post-op Follow-up Note (Signed)
Anesthesia QCDR form completed.        

## 2019-01-13 NOTE — Anesthesia Procedure Notes (Signed)
Epidural Patient location during procedure: OB Start time: 01/13/2019 1:53 AM End time: 01/13/2019 2:00 AM  Staffing Anesthesiologist: Yves Dill, MD Resident/CRNA: Karoline Caldwell, CRNA Performed: resident/CRNA   Preanesthetic Checklist Completed: patient identified, site marked, surgical consent, pre-op evaluation, timeout performed, IV checked, risks and benefits discussed and monitors and equipment checked  Epidural Patient position: sitting Prep: ChloraPrep Patient monitoring: heart rate, continuous pulse ox and blood pressure Approach: midline Location: L3-L4 Injection technique: LOR saline  Needle:  Needle type: Tuohy  Needle gauge: 17 G Needle length: 9 cm and 9 Needle insertion depth: 7 cm Catheter type: closed end flexible Catheter size: 19 Gauge Catheter at skin depth: 12 cm Test dose: negative and 1.5% lidocaine with Epi 1:200 K  Assessment Sensory level: T10 Events: blood not aspirated, injection not painful, no injection resistance, negative IV test and no paresthesia  Additional Notes 1 attempt Pt. Evaluated and documentation done after procedure finished. Patient identified. Risks/Benefits/Options discussed with patient including but not limited to bleeding, infection, nerve damage, paralysis, failed block, incomplete pain control, headache, blood pressure changes, nausea, vomiting, reactions to medication both or allergic, itching and postpartum back pain. Confirmed with bedside nurse the patient's most recent platelet count. Confirmed with patient that they are not currently taking any anticoagulation, have any bleeding history or any family history of bleeding disorders. Patient expressed understanding and wished to proceed. All questions were answered. Sterile technique was used throughout the entire procedure. Please see nursing notes for vital signs. Test dose was given through epidural catheter and negative prior to continuing to dose epidural or start  infusion. Warning signs of high block given to the patient including shortness of breath, tingling/numbness in hands, complete motor block, or any concerning symptoms with instructions to call for help. Patient was given instructions on fall risk and not to get out of bed. All questions and concerns addressed with instructions to call with any issues or inadequate analgesia.   Patient tolerated the insertion well without immediate complications.Reason for block:procedure for pain

## 2019-01-13 NOTE — Progress Notes (Signed)
Dr. Noralyn Pick administered phenylephrine IV along with 100cc bolus of fluid due to BP 62/43. Recheck Bps 88/62, 89/76, 131/74. Will continue to monitor.

## 2019-01-13 NOTE — Progress Notes (Addendum)
Called to see patient due to intermittently low blood pressures.   Since 11pm last night SBP 62-152/37-110. She has received 400 mcg ephedrine over the past 4 hours. Her urine output has been ~5-10 mL since the catheter was place several hours ago.  She has received three liters of LR (2 liter bolus).    Upon arrival to her room the anesthesiologist is bedside after just moving and adjusting the BP cuff. The blood pressure is 119/65.  Her pulse is 78.   Since she is so comfortable her epidural rate has been lowered to 6.    Full set of vital signs BP 119/65   Pulse 78   Temp (!) 96.4 F (35.8 C) (Axillary)   Resp 20   Ht '5\' 3"'  (1.6 m)   Wt 112.9 kg   LMP 05/12/2018   SpO2 100%   BMI 44.11 kg/m  Her temp is taken axillary (so, this converts to a 36.3-36.8C temp, this equals approximately 97.3 - 98.38F orally) UOP: ~38m/6.5 hours Gen: NAD, mentating well. Responds promptly and appropriately to questions. CVX: 4 cm per RN, scant vaginal bleeding - no significant bleeding since admission  Bedside ultrasound performed. The area of concern where her placenta is/was located appears somewhat increased.  It was not measured previously.  It measures roughly 8.6 x 15.4 x 14.9 cm at this time.  This correlates to a volume of ~1,973 cm^3 (1,973 mL).  It is hard to know how much of an increase this is. But, it appears somewhat larger. Since I did not measure last time, I would only be able to estimate the increase in size. I estimate this to be only modestly bigger.     Labs as of 23:25 last night:  Kleihauer-Betke stain: NEGATIVE CMP: (Na - 135, K - 3.6, Chloride - 107, CO2 - 17, glucose 138, BUN 20, Cr 1.22, Ca 7.9, tot prot - 5.8, alb 2.4, AST 45, ALT 31, alk phos 160, tot bili 0.6, eGFR > 60) CBC: WBC 12.2, hgb 10.5, hct 30.8, mcv 77.4, plt 237) UDS: +cannabanoids only Urine prot/cr: >3,000/462 = > 6.5 (highly concentrated urine)  Labs 06:39 this AM CMP Na - 136, K - 5.2, Chloride - 107, CO2 -  19, glucose 169, BUN 23, Cr 1.92, Ca 7.7, tot prot - 5.4, alb 2.1, AST 66 (45 prior), ALT 30, alk phos 125, tot bili 1.4, eGFR 40  CBC: WBC 22.5, hgb 8.0, hct 23.6, plt 123 PT/INR: 14.6/1.2 (nml) APTT: 34 (nml) Fibrinogen: 200 (low - 210-475)   A/P: 28y.o. [id number] stillbirth.   - BPs improved with changing location of cuff and ephedrine.    - CBC, CMP, PT/INR/aPTT, Fibrinogen - anesthesia aware.   - already typed and crossed for 2 units, may need to consider blood transfusion in anticipation of much lower blood counts (some dilutional, but still I believe there is continued blood loss). - may need to consider to move to surgical delivery to expedite delivery and attempt to minimize blood loss. -  Some labs have returned showing a drop in hgb/hct and platelets. WBC increased. Some of this to be expected given hypovolemia.   - give 1 unit pRBC now.  - Above attributable to abruption +/- HUS, severe preeclampsia, etc.   - will move to c-section for delivery asap to attempt to curb blood loss, if due to abruption. I have personally consented her. - I have personally consented her for transfusion of the various blood products.  I discussed the risks and benefits and she agrees to proceed with transfusion.    Prentice Docker, MD, [facility], [facility] 01/13/2019 7:14 AM

## 2019-01-13 NOTE — Progress Notes (Signed)
   01/13/19 1500  Clinical Encounter Type  Visited With Patient and family together  Visit Type Follow-up  Referral From Chaplain  Consult/Referral To Chaplain  Spiritual Encounters  Spiritual Needs Emotional;Grief support  Stress Factors  Patient Stress Factors Loss  Family Stress Factors Loss  F/u with pt at 1530- pt sister was at bedside. Baby [place] (deceased) was also at bedside w/ pt. Pt shared that she was experience preeclampsia and will hv to stay to see how well her kidneys are functioning.

## 2019-01-13 NOTE — H&P (Signed)
OB History & Physical   History of Present Illness:  Chief Complaint: abdominal pain  HPI:  Sierra Jordan is a 28 y.o. [id number] female at [redacted]w[redacted]d dated by 8 week ultrasound.  Her pregnancy has been complicated by late to prenatal care (entry at 21 weeks), obesity, history of postpartum hemorrhage with D&C required postpartum, history of gestational hypertension, history of sickle cell C trait.    She reports contractions.   She denies leakage of fluid.   She denies vaginal bleeding.   She denies fetal movement.   She reports severe abdominal pain since about 0300 4/17.  The pain has increased during the day. She also felt the baby moving the morning of 4/17, but has not felt really anything since that time.  She has also had nausea and emesis. She has had a hard time keeping anything down today.  She denies sick contacts. She denies respiratory symptoms, including shortness of breath and cough.  She denies fevers and chills.   Maternal Medical History:   Past Medical History:  Diagnosis Date  . Medical history non-contributory     Past Surgical History:  Procedure Laterality Date  . DILATION AND CURETTAGE OF UTERUS  2008, 2012   D&E EAB  . DILATION AND CURETTAGE OF UTERUS N/A 11/03/2016   Procedure: DILATATION AND CURETTAGE;  Surgeon: Nadara Mustard, MD;  Location: [facility] ORS;  Service: Gynecology;  Laterality: N/A;   Allergies: No Known Allergies  Prior to Admission medications   Medication Sig Start Date End Date Taking? Authorizing Provider  acetaminophen (TYLENOL) 325 MG tablet Take 325 mg by mouth every 4 (four) hours as needed for mild pain or headache.     [provider]  ondansetron (ZOFRAN-ODT) 4 MG disintegrating tablet TAKE 1 TABLET BY MOUTH EVERY 6 HOURS AS NEEDED FOR NAUSEA 12/18/18   Nadara Mustard, MD  Prenatal Vit-Fe Fumarate-FA (PRENATAL MULTIVITAMIN) TABS tablet Take 1 tablet by mouth daily at 12 noon. 07/18/18   Christeen Douglas, MD  promethazine (PHENERGAN)  25 MG tablet Take 1 tablet (25 mg total) by mouth every 6 (six) hours as needed for nausea or vomiting. 12/25/18   Conard Novak, MD    OB History  Gravida Para Term Preterm AB Living  0 5 1  SAB TAB Ectopic Multiple Live Births  3 0 0 0 1    # Outcome Date GA Lbr Len/2nd Weight Sex Delivery Anes PTL Lv  7 Current           6 Term 11/03/16 [redacted]w[redacted]d    Vag-Spont  N LIV     Complications: Gestational diabetes  5 SAB 07/2015          4 AB 01/2015          3 SAB 06/2014          2 SAB 02/2011          1 AB 07/2007            Prenatal care site: [facility]  Social History: She  reports that she has quit smoking. She has never used smokeless tobacco. She reports current drug use. Drug: Marijuana. She reports that she does not drink alcohol.  Family History: family history includes Breast cancer in her maternal aunt; Depression in her mother; Diabetes in her father and mother; Hyperlipidemia in her mother; Hypertension in her mother; Pancreatitis in her mother.   Review of Systems:  Review of Systems  Constitutional: Negative.  Negative for chills, diaphoresis, fever and malaise/fatigue.  HENT: Negative.  Negative for sore throat.   Eyes: Negative.  Negative for blurred vision, double vision, photophobia and pain.  Respiratory: Negative.  Negative for cough, hemoptysis, shortness of breath and wheezing.   Cardiovascular: Negative.  Negative for chest pain.  Gastrointestinal: Positive for abdominal pain, constipation, nausea and vomiting. Negative for blood in stool, diarrhea, heartburn and melena.  Genitourinary: Negative.   Musculoskeletal: Negative.   Skin: Negative.   Neurological: Negative.  Negative for headaches.  Psychiatric/Behavioral: Negative.      Physical Exam:  Vital Signs: BP 115/79   Pulse 92   Temp 97.9 F (36.6 C) (Axillary)   Resp 20   Ht 5\' 3"  (1.6 m)   Wt 112.9 kg   LMP 05/12/2018   SpO2 99%   BMI 44.11 kg/m  Physical Exam   Constitutional: She is oriented to person, place, and time. She appears distressed.  Moderate distress, appears very uncomfortable  HENT:  Head: Normocephalic and atraumatic.  Neck: Normal range of motion. Neck supple. No tracheal deviation present. No thyromegaly present.  Cardiovascular: Normal rate and regular rhythm. Exam reveals no gallop and no friction rub.  No murmur heard. Pulmonary/Chest: Effort normal and breath sounds normal. No respiratory distress. She has no wheezes. She has no rales.  Abdominal: Soft. There is abdominal tenderness.  Gravid, tender over uterus  Genitourinary:    Genitourinary Comments: 3cm per RN, scant blood noted   Musculoskeletal: Normal range of motion.        General: No edema.  Lymphadenopathy:    She has no cervical adenopathy.  Neurological: She is alert and oriented to person, place, and time. No cranial nerve deficit.  Skin: Skin is warm. She is diaphoretic.  Psychiatric: Mood, affect and judgment normal.      Pertinent Results:  Prenatal Labs: Blood type/Rh A positive  Antibody screen negative  Rubella Immune  Varicella Immune    RPR NR  HBsAg negative  HIV negative  GC negative  Chlamydia negative  Genetic screening NIPT, diploid XX  1 hour GTT Early hemoglobin A1c=5.4, 28 week=183, she has not had a 3h gtt.  3 hour GTT Not done  GBS Not  done   Lab Results  Component Value Date   WBC 12.2 (H) 01/12/2019   HGB 10.5 (L) 01/12/2019   HCT 30.8 (L) 01/12/2019   PLT 237 01/12/2019   CREATININE 1.22 (H) 01/12/2019   ALT 31 01/12/2019   AST 45 (H) 01/12/2019   PROTCRRATIO 0.21 (H) 11/02/2016     FHR: no tones detected after 15 minutes of attempting to find by RN staff  Bedside Ultrasound:  Number of Fetus: 1  Presentation: cephalic  Fluid: subjecitvely very low  Placental Location: anterior, appears enlarged, features consistent with possible abruption  Attempt was made to find fetal cardiac activity using the  ultrasound. There was no fetal movement. There was no cardiac activity.  Doppler interrogation was utilized to attempt to find blood flow without success. This was performed in the thoracic cavity and throughout the fetus. After 15 minutes, I took a break due to patient discomfort. I tried again for another five minutes. I showed the patient the screen where the fetal heart is located and showed her the lack of movement or activity of the fetal heart.  Assessment:  Sierra Jordan is a 28 y.o. [id number] female at 7862w1d with stillbirth.  I can not rule out abruption as the cause. I  can not rule out diabetes as a causative factor.  She also has acute kidney injury, likely due to hypovolemia. She also has a mild transaminitis with an elevated AST (45).  Her blood glucose on CMP was also elevated at 138.    Plan:  1. Admit to Labor & Delivery  2. CBC, T&S, NPO, IVF 3. GBS unknown.   4. Fetwal well-being: Discussed fetal demise. She appear quite stoic. However, she remains in quite a bit of pain. Will attempt to gently get her pain under control and discuss findings at that point.   5. Nausea and vomiting: Will give her and IV fluid bolus to try to correct her hypovolemia.  IV antiemetics, prn.  6. Stillborn: will obtain stillbirth labs (APLAS, Kleihauer-Betke stain, Parvovirus B19, RPR). Will discuss fetal autopsy and other methods of determine the cause of her still born child.   7. I am concerned about placental abruption. Will attempt to give her IV fluid resuscitation. Anesthesia aware in case acute abruption occurs.  She appears to be laboring.  Will support her labor and will augment, as indicated, to effect delivery, if at all possible.     Thomasene Mohair, MD 01/13/2019 12:07 AM

## 2019-01-13 NOTE — Transfer of Care (Signed)
Immediate Anesthesia Transfer of Care Note  Patient: Sierra Jordan  Procedure(s) Performed: CESAREAN SECTION (N/A Abdomen)  Patient Location: PACU  Anesthesia Type:Epidural  Level of Consciousness: awake and patient cooperative  Airway & Oxygen Therapy: Patient Spontanous Breathing  Post-op Assessment: Report given to RN and Post -op Vital signs reviewed and stable  Post vital signs: Reviewed and stable  Last Vitals:  Vitals Value Taken Time  BP 158/104 01/13/2019 0945  Temp 97.5F   Pulse 106   Resp 16   SpO2 100%     Last Pain:  Vitals:   01/13/19 0704  TempSrc: Oral  PainSc:          Complications: No apparent anesthesia complications

## 2019-01-13 NOTE — Consult Note (Signed)
[facility] Medical Consultation  Sierra Jordan ZDG:[id number] DOB: 06/30/1991 DOA: 01/12/2019 PCP: Patient, No Pcp Per   Requesting physician: Thomasene MohairStephen Jackson MD Date of consultation: 01/13/2019 Reason for consultation: Elevated blood pressure  CHIEF COMPLAINT:   Chief Complaint  Patient presents with  . Abdominal Cramping    HISTORY OF PRESENT ILLNESS: Sierra Jordan  is a 28 y.o. female with a known history of intrauterine pregnancy at 35 weeks 1 day currently under the labor and delivery OB/GYN service after she had a placental abruption.  Patient had a stillbirth.  Low transverse C-section was done by OB/GYN today.  Hospitalist service was consulted for elevated blood pressure and poor renal function.  Creatinine is greater than 2.  Patient started on IV magnesium sulfate and currently on Pitocin.  She is also on intravenous cefoxitin antibiotic.  Blood pressure has improved after 60 mg labetalol was given.  Currently blood pressure is 123/ 70 mmHg and her urine output is also improving.  Had some blurry vision this morning but it completely resolved.  No complaints of any headache, tingling or numbness in any part of the body.  Patient also received 1 unit PRBC IV for anemia due for another unit of PRBC transfusion.  PAST MEDICAL HISTORY:   Past Medical History:  Diagnosis Date  . Medical history non-contributory     PAST SURGICAL HISTORY:  Past Surgical History:  Procedure Laterality Date  . DILATION AND CURETTAGE OF UTERUS  2008, 2012   D&E EAB  . DILATION AND CURETTAGE OF UTERUS N/A 11/03/2016   Procedure: DILATATION AND CURETTAGE;  Surgeon: Nadara Mustardobert P Harris, MD;  Location: [facility] ORS;  Service: Gynecology;  Laterality: N/A;    SOCIAL HISTORY:  Social History   Tobacco Use  . Smoking status: Current Every Day Smoker  . Smokeless tobacco: Never Used  Substance Use Topics  . Alcohol use: No    FAMILY HISTORY:  Family History  Problem Relation Age of Onset  . Diabetes  Mother   . Hyperlipidemia Mother   . Hypertension Mother   . Depression Mother   . Pancreatitis Mother   . Diabetes Father   . Breast cancer Maternal Aunt     DRUG ALLERGIES: No Known Allergies  REVIEW OF SYSTEMS:   CONSTITUTIONAL: No fever, fatigue or weakness.  EYES: No blurred or double vision.  EARS, NOSE, AND THROAT: No tinnitus or ear pain.  RESPIRATORY: No cough, shortness of breath, wheezing or hemoptysis.  CARDIOVASCULAR: No chest pain, orthopnea, edema.  GASTROINTESTINAL: No nausea, vomiting, diarrhea or abdominal pain.  GENITOURINARY: No dysuria, hematuria.  ENDOCRINE: No polyuria, nocturia,  HEMATOLOGY: No anemia, easy bruising or bleeding SKIN: No rash or lesion. MUSCULOSKELETAL: No joint pain or arthritis.   NEUROLOGIC: No tingling, numbness, weakness.  PSYCHIATRY: No anxiety or depression.   MEDICATIONS AT HOME:  Prior to Admission medications   Medication Sig Start Date End Date Taking? Authorizing Provider  acetaminophen (TYLENOL) 325 MG tablet Take 325 mg by mouth every 4 (four) hours as needed for mild pain or headache.     [provider]  ondansetron (ZOFRAN-ODT) 4 MG disintegrating tablet TAKE 1 TABLET BY MOUTH EVERY 6 HOURS AS NEEDED FOR NAUSEA 12/18/18   Nadara MustardHarris, Robert P, MD  Prenatal Vit-Fe Fumarate-FA (PRENATAL MULTIVITAMIN) TABS tablet Take 1 tablet by mouth daily at 12 noon. 07/18/18   Christeen DouglasBeasley, Bethany, MD  promethazine (PHENERGAN) 25 MG tablet Take 1 tablet (25 mg total) by mouth every 6 (six) hours as needed  for nausea or vomiting. 12/25/18   Conard Novak, MD      PHYSICAL EXAMINATION:   VITAL SIGNS: Blood pressure (!) 137/91, pulse 94, temperature 98 F (36.7 C), temperature source Oral, resp. rate 19, height 5\' 3"  (1.6 m), weight 112.9 kg, last menstrual period 05/12/2018, SpO2 99 %, unknown if currently breastfeeding.  GENERAL:  28 y.o.-year-old patient lying in the bed with no acute distress.  EYES: Pupils equal, round,  reactive to light and accommodation. No scleral icterus. Extraocular muscles intact.  HEENT: Head atraumatic, normocephalic. Oropharynx and nasopharynx clear.  NECK:  Supple, no jugular venous distention. No thyroid enlargement, no tenderness.  LUNGS: Normal breath sounds bilaterally, no wheezing, rales,rhonchi or crepitation. No use of accessory muscles of respiration.  CARDIOVASCULAR: S1, S2 normal. No murmurs, rubs, or gallops.  ABDOMEN: Soft, mild tenderness below the umbilicus, nondistended. Bowel sounds present. No organomegaly or mass.  Abdominal bandage noted EXTREMITIES: No pedal edema, cyanosis, or clubbing.  NEUROLOGIC: Cranial nerves II through XII are intact. Muscle strength 5/5 in all extremities. Sensation intact. Gait not checked.  PSYCHIATRIC: The patient is alert and oriented x 3.  SKIN: No obvious rash, lesion, or ulcer.   LABORATORY PANEL:   CBC Recent Labs  Lab 01/12/19 2325 01/13/19 0639 01/13/19 1008 01/13/19 1239  WBC 12.2* 22.5* 25.4* 20.3*  HGB 10.5* 8.0* 9.9* 8.5*  HCT 30.8* 23.6* 29.3* 24.0*  PLT 237 123* 114* 102*  MCV 77.4* 80.5 82.1 79.5*  MCH 26.4 27.3 27.7 28.1  MCHC 34.1 33.9 33.8 35.4  RDW 14.6 15.1 15.5 14.9   ------------------------------------------------------------------------------------------------------------------  Chemistries  Recent Labs  Lab 01/12/19 2325 01/13/19 0639 01/13/19 1239  NA 135 136 136  K 3.6 5.2* 5.3*  CL 107 107 108  CO2 17* 19* 20*  GLUCOSE 138* 169* 139*  BUN 20 23* 27*  CREATININE 1.22* 1.92* 2.15*  CALCIUM 7.9* 7.7* 7.2*  AST 45* 66* 65*  ALT 31 30 31   ALKPHOS 160* 125 101  BILITOT 0.6 1.4* 0.6   ------------------------------------------------------------------------------------------------------------------ estimated creatinine clearance is 47.1 mL/min (A) (by C-G formula based on SCr of 2.15 mg/dL  (H)). ------------------------------------------------------------------------------------------------------------------ No results for input(s): TSH, T4TOTAL, T3FREE, THYROIDAB in the last 72 hours.  Invalid input(s): FREET3   Coagulation profile Recent Labs  Lab 01/13/19 0639  INR 1.2   ------------------------------------------------------------------------------------------------------------------- No results for input(s): DDIMER in the last 72 hours. -------------------------------------------------------------------------------------------------------------------  Cardiac Enzymes No results for input(s): CKMB, TROPONINI, MYOGLOBIN in the last 168 hours.  Invalid input(s): CK ------------------------------------------------------------------------------------------------------------------ Invalid input(s): POCBNP  ---------------------------------------------------------------------------------------------------------------  Urinalysis    Component Value Date/Time   COLORURINE AMBER (A) 07/15/2018 0925   APPEARANCEUR CLEAR (A) 07/15/2018 0925   APPEARANCEUR Clear 08/03/2014 1142   LABSPEC 1.032 (H) 07/15/2018 0925   LABSPEC 1.023 08/03/2014 1142   PHURINE 6.0 07/15/2018 0925   GLUCOSEU Negative 01/02/2019 1611   GLUCOSEU Negative 08/03/2014 1142   HGBUR NEGATIVE 07/15/2018 0925   BILIRUBINUR neg 01/02/2019 1611   BILIRUBINUR Negative 08/03/2014 1142   KETONESUR 80 (A) 07/15/2018 0925   PROTEINUR 100 (A) 07/15/2018 0925   UROBILINOGEN negative (A) 01/02/2019 1611   NITRITE neg 01/02/2019 1611   NITRITE NEGATIVE 07/15/2018 0925   LEUKOCYTESUR Negative 01/02/2019 1611   LEUKOCYTESUR 1+ 08/03/2014 1142     RADIOLOGY: No results found.  EKG: Orders placed or performed during the hospital encounter of 12/23/18  . EKG 12-Lead  . EKG 12-Lead    IMPRESSION AND PLAN: 28 year old female patient with a known  history of intrauterine pregnancy at 35 weeks 1 day  currently under the labor and delivery OB/GYN service after she had a placental abruption.  Patient had a stillbirth.  Low transverse C-section was done by OB/GYN today.   -Abruptio placentae Status post cesarean section Stillbirth On IV magnesium sulfate infusion IV Pitocin infusion  -Uncontrolled hypertension Currently on IV magnesium sulfate infusion Blood pressure better controlled We will monitor Received 60 mg IV labetalol 1 dose  -Tobacco abuse Tobacco cessation counseled to the patient for 6 minutes Nicotine patch offered  -Leukocytosis Check chest x-ray to rule out pneumonia Continue IV cefoxitin antibiotic Follow-up WBC count Could be from abruptio placentae  -Acute kidney injury Received IV Ringer lactate so far IV normal saline hydration Follow-up renal function Check renal ultrasound  All the records are reviewed and case discussed with ED provider. Management plans discussed with the patient, family and they are in agreement.  CODE STATUS:Full code Code Status History    Date Active Date Inactive Code Status Order ID Comments User Context   01/12/2019 2304 01/13/2019 0954 Full Code [id number]  Conard Novak, MD Inpatient   07/15/2018 1602 07/17/2018 2333 Full Code [id number]  Randa Ngo, CNM ED   11/03/2016 2119 11/05/2016 1627 Full Code [id number]  Nadara Mustard, MD Inpatient   11/02/2016 1457 11/03/2016 2117 Full Code [id number]  Conard Novak, MD Inpatient   11/02/2016 1154 11/02/2016 1457 Full Code [id number]  Conard Novak, MD Inpatient   04/22/2016 1550 04/25/2016 0035 Full Code [id number]  Sharee Pimple, CNM ED       TOTAL TIME TAKING CARE OF THIS PATIENT: 53 minutes.    Ihor Austin M.D on 01/13/2019 at 2:43 PM  Between 7am to 6pm - Pager - [phone]  After 6pm go to [email or web address] - password [email or web address] [facility]  [facility] Office  [phone]  CC: Primary care physician; Patient, No Pcp Per

## 2019-01-13 NOTE — Op Note (Addendum)
Cesarean Section Operative Note    Sierra Jordan   01/13/2019   Pre-operative Diagnosis:  1) Placental abruption, remote from delivery 2) Stillbirth (already present on admission) 3) intrauterine pregnancy at [redacted]w[redacted]d   Post-operative Diagnosis:   1) Placental abruption, remote from delivery 2) Stillbirth (already present on admission) 3) intrauterine pregnancy at [redacted]w[redacted]d   Procedure: Primary Low Transverse Cesarean Section via Pfannenstiel incision with double-layer uterine closure  Surgeon: Surgeon(s) and Role:    * Conard Novak, MD - Primary   Assistants: Dr. Annamarie Major; No other capable assistant available, in surgery requiring high level assistant.  Anesthesia: epidural   Findings:  1) normal appearing fallopian tubes and ovaries 2) Uterus normal in shape, [place] uterus   Quantified Blood Loss: 1,300 mL (a significant proportion from pre-operative placental abruption). Estimated surgical blood loss ~800 mL.   Total IV Fluids: 800 ml crystalloid, 360 mL packed red cells  Urine Output: minimal  Specimens: placenta to pathology for permanent  Complications: no complications  Disposition: PACU - hemodynamically stable.   Maternal Condition: stable   Baby condition / location:  Morgue  Procedure Details:  The patient was seen in the Holding Room. The risks, benefits, complications, treatment options, and expected outcomes were discussed with the patient. The patient concurred with the proposed plan, giving informed consent. identified as Sierra Jordan and the procedure verified as C-Section Delivery. A Time Out was held and the above information confirmed.   After induction of anesthesia, the patient was draped and prepped in the usual sterile manner. A Pfannenstiel incision was made and carried down through the subcutaneous tissue to the fascia. Fascial incision was made and extended transversely. The fascia was separated from the underlying rectus tissue  superiorly and inferiorly. The peritoneum was identified and entered. Peritoneal incision was extended longitudinally. The bladder flap was not bluntly or sharply freed from the lower uterine segment. A low transverse uterine incision was made and the hysterotomy was extended with cranial-caudal tension. Delivered from cephalic presentation was a stillborn infant.  Upon entry into the uterus there was clot and the placenta was largely unattached to the uterus.  The umbilical cord was clamped and cut cord blood was not obtained for evaluation. The placenta was removed Intact and appeared to be covered with clots on the maternal surface. The uterine outline, tubes and ovaries appeared normal. The uterine incision was closed with running locked sutures of 0 Vicryl.  A second layer of the same suture was thrown in an imbricating fashion.  A figure-of-eight stitch with 0 Vicryl was thrown on the right aspect of the uterus.  Hemostasis was assured.  The uterus was returned to the abdomen and the paracolic gutters were cleared of all clots and debris.  The rectus muscles were inspected and found to be hemostatic.  Fibrillar was placed over the incision to maintain hemostasis.   The fascia was then reapproximated with running sutures of 1-0 PDS, looped. The subcutaneous tissue was reapproximated using 2-0 plain gut such that no greater than 2cm of dead space remained. The subcuticular closure was performed using 4-0 monocryl. The skin closure was reinforced using benzoin and 1/2" steri-strips.  A pressure dressing was applied.  Instrument, sponge, and needle counts were correct prior the abdominal closure and were correct at the conclusion of the case.  The patient received Ancef 3 gram IV prior to skin incision (within 30 minutes). For VTE prophylaxis she was wearing SCDs throughout the case.  The assistant  surgeon was an MD due to lack of availability of another Sales promotion account executivequalified assistant.   Signed: Conard NovakStephen D. Kentavius Dettore,  MD 01/13/2019 9:31 AM

## 2019-01-13 NOTE — OB Triage Note (Signed)
Pt presents to unit c/o abdominal pain and cramping since 0300 this morning, pain rating 10/10. Pt reports decreased fetal movement since this afternoon, none currently. Pt denies vaginal bleeding or LOF.

## 2019-01-13 NOTE — Progress Notes (Signed)
DOS0: for growing abruption/ blood loss. Subjective:   Had some blurred vision after delivery, but no scotomata, headache, chest pain. CTSP earlier around 1130 due to severe range blood pressures and she received 20 mgm then 40 mgm labetalol IV  Objective:  CTSP earlier around 1130 due to severe range blood pressures and she received 20 mgm then 40 mgm labetalol IV. She was also begun on magnesium sulfate for preeclampsia with severe features. Patient Vitals for the past 24 hrs:  BP Temp Temp src Pulse Resp SpO2 Height Weight  01/13/19 1500 (!) 140/97 97.7 F (36.5 C) Axillary - - - - -  01/13/19 1417 - 98 F (36.7 C) Oral - - - - -  01/13/19 1400 (!) 137/91 98 F (36.7 C) Oral 94 19 99 % - -  01/13/19 1342 (!) 129/93 - - 89 12 100 % - -  01/13/19 1300 (!) 147/91 - - (!) 131 - 98 % - -  01/13/19 1230 (!) 146/102 - - (!) 101 11 99 % - -  01/13/19 1200 (!) 169/113 - - 100 (!) 24 100 % - -  01/13/19 1151 (!) 166/119 - - (!) 102 (!) 25 100 % - -  01/13/19 1130 (!) 167/126 - - (!) 116 (!) 21 99 % - -  01/13/19 1100 (!) 155/109 - - (!) 110 14 100 % - -  01/13/19 1058 - - - (!) 110 12 100 % - -  01/13/19 1030 (!) 163/104 - - (!) 117 (!) 22 98 % - -  01/13/19 1015 - - - (!) 122 19 100 % - -  01/13/19 1000 - 97.9 F (36.6 C) - (!) 125 20 (!) 87 % - -  01/13/19 0736 - - - - - 100 % - -  01/13/19 0726 132/84 - - (!) 106 - - - -  01/13/19 0711 133/72 - - 96 - - - -  01/13/19 0704 - 97.6 F (36.4 C) Oral - - 94 % - -  01/13/19 0656 122/71 - - 94 - - - -  01/13/19 0654 - - - - - 100 % - -  01/13/19 0651 119/71 - - 92 - 100 % - -  01/13/19 0642 119/65 - - 78 - 100 % - -  01/13/19 0636 117/71 - - 84 - 100 % - -  01/13/19 0631 126/69 - - 84 - 100 % - -  01/13/19 0629 131/74 - - 79 - - - -  01/13/19 0626 (!) 89/76 - - 72 - - - -  01/13/19 0621 (!) 88/62 - - 93 - - - -  01/13/19 0616 (!) 62/43 - - 95 - - - -  01/13/19 0611 (!) 77/60 - - 90 - - - -  01/13/19 0607 (!) 98/50 - - 95 - 100 % - -   01/13/19 0601 100/60 - - 98 - 100 % - -  01/13/19 0556 (!) 88/55 - - - - - - -  01/13/19 0551 100/79 - - 91 - - - -  01/13/19 0546 99/75 - - 95 - - - -  01/13/19 0541 (!) 86/52 - - 97 - - - -  01/13/19 0526 100/70 - - 93 - 100 % - -  01/13/19 0511 90/65 - - 96 - - - -  01/13/19 0456 95/66 - - 98 - 100 % - -  01/13/19 0441 107/69 - - 98 - 100 % - -  01/13/19 0426 98/71 - -  98 - - - -  01/13/19 0411 101/74 - - 100 - - - -  01/13/19 0407 105/65 - - 90 - 100 % - -  01/13/19 0404 104/75 - - 87 - - - -  01/13/19 0403 (!) 69/37 - - 90 - - - -  01/13/19 0355 (!) 72/50 - - 90 - 94 % - -  01/13/19 0350 (!) 66/46 - - 95 - 100 % - -  01/13/19 0349 (!) 68/42 - - (!) 108 - - - -  01/13/19 0339 - (!) 96.4 F (35.8 C) Axillary - - 99 % - -  01/13/19 0249 96/64 - - 99 - 100 % - -  01/13/19 0235 (!) 98/56 - - 99 - 100 % - -  01/13/19 0219 (!) 101/57 - - (!) 106 - - - -  01/13/19 0214 (!) 101/53 - - (!) 154 - - - -  01/13/19 0208 99/74 - - (!) 108 - - - -  01/13/19 0204 92/75 - - (!) 118 - - - -  01/13/19 0201 (!) 125/110 - - 92 - 100 % - -  01/13/19 0048 (!) 128/98 - - 98 - - - -  01/12/19 2358 115/79 - - 92 - - - -  01/12/19 2353 (!) 88/71 - - 97 - - - -  01/12/19 2340 (!) 148/80 - - (!) 101 - - - -  01/12/19 2302 (!) 152/99 97.9 F (36.6 C) Axillary (!) 115 20 - 5\' 3"  (1.6 m) 112.9 kg  01/12/19 2255 - - - - - 99 % - -   Blood pressure (!) 140/97, pulse 94, temperature 97.7 F (36.5 C), temperature source Axillary, resp. rate 19, height 5\' 3"  (1.6 m), weight 112.9 kg, last menstrual period 05/12/2018, SpO2 99 %, unknown if currently breastfeeding.  General: BF in NAD, has been intermittently holding her baby [place].  Heart: RRR without murmur Pulmonary: no increased work of breathing/ CTAB FF at U-2/ML/NT Incision: C&D&I dressing Lochia: appropriate, small Extremities: no edema, SCDs intact  Results for orders placed or performed during the hospital encounter of 01/12/19 (from the past  72 hour(s))  Urine Drug Screen, Qualitative ([facility] only)     Status: Abnormal   Collection Time: 01/12/19 11:15 PM  Result Value Ref Range   Tricyclic, Ur Screen NONE DETECTED NONE DETECTED   Amphetamines, Ur Screen NONE DETECTED NONE DETECTED   MDMA (Ecstasy)Ur Screen NONE DETECTED NONE DETECTED   Cocaine Metabolite,Ur Saunders NONE DETECTED NONE DETECTED   Opiate, Ur Screen NONE DETECTED NONE DETECTED   Phencyclidine (PCP) Ur S NONE DETECTED NONE DETECTED   Cannabinoid 50 Ng, Ur  POSITIVE (A) NONE DETECTED   Barbiturates, Ur Screen NONE DETECTED NONE DETECTED   Benzodiazepine, Ur Scrn NONE DETECTED NONE DETECTED   Methadone Scn, Ur NONE DETECTED NONE DETECTED    Comment: (NOTE) Tricyclics + metabolites, urine    Cutoff 1000 ng/mL Amphetamines + metabolites, urine  Cutoff 1000 ng/mL MDMA (Ecstasy), urine              Cutoff 500 ng/mL Cocaine Metabolite, urine          Cutoff 300 ng/mL Opiate + metabolites, urine        Cutoff 300 ng/mL Phencyclidine (PCP), urine         Cutoff 25 ng/mL Cannabinoid, urine                 Cutoff 50 ng/mL Barbiturates + metabolites, urine  Cutoff 200  ng/mL Benzodiazepine, urine              Cutoff 200 ng/mL Methadone, urine                   Cutoff 300 ng/mL The urine drug screen provides only a preliminary, unconfirmed analytical test result and should not be used for non-medical purposes. Clinical consideration and professional judgment should be applied to any positive drug screen result due to possible interfering substances. A more specific alternate chemical method must be used in order to obtain a confirmed analytical result. Gas chromatography / mass spectrometry (GC/MS) is the preferred confirmat ory method. Performed at [facility], [address] [place], [place] [address]   Protein / creatinine ratio, urine     Status: None   Collection Time: 01/12/19 11:15 PM  Result Value Ref Range   Creatinine, Urine 462 mg/dL    Comment:  RESULTS CONFIRMED BY MANUAL DILUTION   Total Protein, Urine >3,000 mg/dL    Comment: RESULTS CONFIRMED BY MANUAL DILUTION   Protein Creatinine Ratio NOT CALCULATED 0.00 - 0.15 mg/mg[Cre]    Comment: Performed at [facility], [address] [place], [place] [address]  CBC     Status: Abnormal   Collection Time: 01/12/19 11:25 PM  Result Value Ref Range   WBC 12.2 (H) 4.0 - 10.5 K/uL   RBC 3.98 3.87 - 5.11 MIL/uL   Hemoglobin 10.5 (L) 12.0 - 15.0 g/dL   HCT 09.830.8 (L) 11.936.0 - 14.746.0 %   MCV 77.4 (L) 80.0 - 100.0 fL   MCH 26.4 26.0 - 34.0 pg   MCHC 34.1 30.0 - 36.0 g/dL   RDW 82.914.6 56.211.5 - 13.015.5 %   Platelets 237 150 - 400 K/uL   nRBC 0.2 0.0 - 0.2 %    Comment: Performed at [facility], [address] [place], [place] [address]  Kleihauer-Betke stain     Status: None   Collection Time: 01/12/19 11:25 PM  Result Value Ref Range   Fetal Cells % 0 %   Quantitation Fetal Hemoglobin 0.0000 mL   # Vials RhIg NOT INDICATED     Comment: Performed at [facility], [address] [place], [place] [address]  Comprehensive metabolic panel     Status: Abnormal   Collection Time: 01/12/19 11:25 PM  Result Value Ref Range   Sodium 135 135 - 145 mmol/L   Potassium 3.6 3.5 - 5.1 mmol/L   Chloride 107 98 - 111 mmol/L   CO2 17 (L) 22 - 32 mmol/L   Glucose, Bld 138 (H) 70 - 99 mg/dL   BUN 20 6 - 20 mg/dL   Creatinine, Ser 9.521.22 (H) 0.44 - 1.00 mg/dL   Calcium 7.9 (L) 8.9 - 10.3 mg/dL   Total Protein 5.8 (L) 6.5 - 8.1 g/dL   Albumin 2.4 (L) 3.5 - 5.0 g/dL   AST 45 (H) 15 - 41 U/L   ALT 31 0 - 44 U/L   Alkaline Phosphatase 160 (H) 38 - 126 U/L   Total Bilirubin 0.6 0.3 - 1.2 mg/dL   GFR calc non Af Amer >60 >60 mL/min   GFR calc Af Amer >60 >60 mL/min   Anion gap 11 5 - 15    Comment: Performed at [facility], [address] [place], [place] [address]  Type and screen [facility]     Status: None (Preliminary result)   Collection  Time: 01/12/19 11:32 PM  Result Value Ref Range   ABO/RH(D) A POS    Antibody Screen NEG    Sample Expiration      01/15/2019 Performed at [facility], [address] [place], [place] [address]    Unit Number [id number]    Blood Component Type RBC, LR IRR    Unit division 00    Status of Unit REL FROM [facility]    Transfusion Status OK TO TRANSFUSE    Crossmatch Result Compatible    Unit Number [id number]    Blood Component Type RBC, LR IRR    Unit division 00    Status of Unit ISSUED    Transfusion Status OK TO TRANSFUSE    Crossmatch Result Compatible    Unit Number [id number]    Blood Component Type RED CELLS,LR    Unit division 00    Status of Unit ISSUED    Transfusion Status OK TO TRANSFUSE    Crossmatch Result Compatible    Unit Number [id number]    Blood Component Type RED CELLS,LR    Unit division 00    Status of Unit ALLOCATED    Transfusion Status OK TO TRANSFUSE    Crossmatch Result Compatible    Unit Number [id number]    Blood Component Type RED CELLS,LR    Unit division 00    Status of Unit ALLOCATED    Transfusion Status OK TO TRANSFUSE    Crossmatch Result Compatible   Prepare RBC     Status: None   Collection Time: 01/13/19 12:34 AM  Result Value Ref Range   Order Confirmation      ORDER PROCESSED BY BLOOD BANK Performed at [facility], [address] [place], [place] [address]   CBC     Status: Abnormal   Collection Time: 01/13/19  6:39 AM  Result Value Ref Range   WBC 22.5 (H) 4.0 - 10.5 K/uL   RBC 2.93 (L) 3.87 - 5.11 MIL/uL   Hemoglobin 8.0 (L) 12.0 - 15.0 g/dL   HCT 41.6 (L) 60.6 - 30.1 %   MCV 80.5 80.0 - 100.0 fL   MCH 27.3 26.0 - 34.0 pg   MCHC 33.9 30.0 - 36.0 g/dL   RDW 60.1 09.3 - 23.5 %   Platelets 123 (L) 150 - 400 K/uL    Comment: Immature Platelet Fraction may be clinically indicated, consider ordering this additional test [id number]    nRBC 0.1 0.0 - 0.2 %    Comment: Performed at [facility], [address] [place], [place] [address]  Comprehensive metabolic panel     Status: Abnormal   Collection Time: 01/13/19  6:39 AM  Result Value Ref Range   Sodium 136 135 - 145 mmol/L   Potassium 5.2 (H) 3.5 - 5.1 mmol/L   Chloride 107 98 - 111 mmol/L   CO2 19 (L) 22 - 32 mmol/L   Glucose, Bld 169 (H) 70 - 99 mg/dL   BUN 23 (H) 6 - 20 mg/dL   Creatinine, Ser 2.37 (H) 0.44 - 1.00 mg/dL   Calcium 7.7 (L) 8.9 - 10.3 mg/dL   Total Protein 5.4 (L) 6.5 - 8.1 g/dL   Albumin 2.1 (L) 3.5 - 5.0 g/dL   AST 66 (H) 15 - 41 U/L   ALT 30 0 - 44 U/L   Alkaline Phosphatase 125 38 - 126 U/L   Total Bilirubin 1.4 (H) 0.3 - 1.2 mg/dL   GFR calc non Af Amer 35 (L) >60 mL/min  GFR calc Af Amer 40 (L) >60 mL/min   Anion gap 10 5 - 15    Comment: Performed at [facility], [address] [place], [place] [address]  Protime-INR     Status: None   Collection Time: 01/13/19  6:39 AM  Result Value Ref Range   Prothrombin Time 14.6 11.4 - 15.2 seconds   INR 1.2 0.8 - 1.2    Comment: (NOTE) INR goal varies based on device and disease states. Performed at [facility], [address] [place], [place] [address]   APTT     Status: None   Collection Time: 01/13/19  6:39 AM  Result Value Ref Range   aPTT 34 24 - 36 seconds    Comment: Performed at [facility], [address] [place], [place] [address]  Fibrinogen     Status: Abnormal   Collection Time: 01/13/19  6:39 AM  Result Value Ref Range   Fibrinogen 200 (L) 210 - 475 mg/dL    Comment: Performed at [facility], [address] [place], [place] [address]  Lactate dehydrogenase     Status: Abnormal   Collection Time: 01/13/19  6:45 AM  Result Value Ref Range   LDH 687 (H) 98 - 192 U/L    Comment: Performed at [facility], [address] [place], [place] [address]  CBC     Status: Abnormal   Collection Time: 01/13/19 10:08 AM  Result Value Ref Range   WBC 25.4 (H) 4.0 - 10.5  K/uL   RBC 3.57 (L) 3.87 - 5.11 MIL/uL   Hemoglobin 9.9 (L) 12.0 - 15.0 g/dL   HCT 11.0 (L) 31.5 - 94.5 %   MCV 82.1 80.0 - 100.0 fL   MCH 27.7 26.0 - 34.0 pg   MCHC 33.8 30.0 - 36.0 g/dL   RDW 85.9 29.2 - 44.6 %   Platelets 114 (L) 150 - 400 K/uL    Comment: Immature Platelet Fraction may be clinically indicated, consider ordering this additional test [id number]    nRBC 0.1 0.0 - 0.2 %    Comment: Performed at [facility], [address] [place], [place] [address]  CBC     Status: Abnormal   Collection Time: 01/13/19 12:39 PM  Result Value Ref Range   WBC 20.3 (H) 4.0 - 10.5 K/uL   RBC 3.02 (L) 3.87 - 5.11 MIL/uL   Hemoglobin 8.5 (L) 12.0 - 15.0 g/dL   HCT 90.3 (L) 83.3 - 38.3 %   MCV 79.5 (L) 80.0 - 100.0 fL   MCH 28.1 26.0 - 34.0 pg   MCHC 35.4 30.0 - 36.0 g/dL   RDW 29.1 91.6 - 60.6 %   Platelets 102 (L) 150 - 400 K/uL    Comment: Immature Platelet Fraction may be clinically indicated, consider ordering this additional test [id number]    nRBC 0.1 0.0 - 0.2 %    Comment: Performed at [facility], [address] [place], [place] [address]  Comprehensive metabolic panel     Status: Abnormal   Collection Time: 01/13/19 12:39 PM  Result Value Ref Range   Sodium 136 135 - 145 mmol/L   Potassium 5.3 (H) 3.5 - 5.1 mmol/L   Chloride 108 98 - 111 mmol/L   CO2 20 (L) 22 - 32 mmol/L   Glucose, Bld 139 (H) 70 - 99 mg/dL   BUN 27 (H) 6 - 20 mg/dL   Creatinine, Ser 9.53 (H) 0.44 - 1.00 mg/dL  Calcium 7.2 (L) 8.9 - 10.3 mg/dL   Total Protein 4.7 (L) 6.5 - 8.1 g/dL   Albumin 1.8 (L) 3.5 - 5.0 g/dL   AST 65 (H) 15 - 41 U/L   ALT 31 0 - 44 U/L   Alkaline Phosphatase 101 38 - 126 U/L   Total Bilirubin 0.6 0.3 - 1.2 mg/dL   GFR calc non Af Amer 30 (L) >60 mL/min   GFR calc Af Amer 35 (L) >60 mL/min   Anion gap 8 5 - 15    Comment: Performed at [facility], [address], [place], [place] [address]  Prepare RBC     Status: None   Collection  Time: 01/13/19  2:55 PM  Result Value Ref Range   Order Confirmation      ORDER PROCESSED BY BLOOD BANK Performed at [facility], [address] [place], [place] [address]      Assessment/Plan:   28 y.o. [id number] postoperativeday # 0. Continue post operative care/ clear liquids 1) Preeclampsia with severe features based upon severe blood pressures/elevated creatinine, currently blood pressures in mild range after receiving 60 mgm IV labetalol  Magnesium 4Gm bolus is in, and 1 GM/hr started  Magnesium level to be drawn at 1645  Repeat LDH at that time 2)Acute blood loss anemia - hemoglobin has dropped from 9.9gm/dl to 8.5 Will transfuse second unit of PRBCs per Dr Tiburcio Pea then start  - po ferrous sulfate 3) Acute renal failure: creatinine now >2. Urine output 70-125ml/hr. Potassium now 5.3 and AST stable at 65. ALT normal Will consult hospitalist 4) Stillbirth- patient holding baby and grieving appropriately  5) TDAP status - given 12/27/2018  6) DVT prophylaxis: SCDs on   7) A POS/ RI/ VI  Farrel Conners, CNM

## 2019-01-13 NOTE — Progress Notes (Signed)
Unable to assess FHT after 15 minutes of several position changes. MD notified and requested to obtain a bedside ultrasound. After 2 bedside ultrasounds, no FHT were obtained.

## 2019-01-13 NOTE — Discharge Summary (Signed)
OB Discharge Summary     Patient Name: Sierra Jordan DOB: 10/09/1990 MRN: [id number]  Date of admission: 01/12/2019 Delivering MD: Thomasene Mohair, MD  Date of Delivery: 01/13/2019  Date of discharge: 01/16/2019  Admitting diagnosis: Stillbirth, abruptio placentae Intrauterine pregnancy: [redacted]w[redacted]d     Secondary diagnosis: Anemia  (chronic with acute blood loss due to placenta abruption)     Discharge diagnosis: Stillbirth, abruptio placentae, anemia (chronic with acute loss due to placental abruption)                                                                                                Post partum procedures:blood transfusion  Augmentation: n/a  Complications: Placental Abruption and [facility] course:  The patient was admitted to labor and delivery after diagnosis of stillbirth.  She had already dilated to 3cm.  She advanced to 4 cm. There were issues maintaining blood pressures after the placement of her epidural and even after IV fluid resuscitation her urine output was minimal.  A repeat of her initial labs showed worsening renal function (creatinine increase) and drop in her hemoglobin/hematocrit along with a significant drop in platelets.  Her fibrinogen level was slightly low. Given the overall worsening of her clinical picture with concern for hemodynamic instability and concern for worsening of clotting ability (DIC), along with her status of being remote from delivery, the decision was made to proceed with surgery for hysterotomy to deliver the infant and the placenta and control blood loss.  See the operative report for details.  Of note, the findings of [place] uterus along with the detached placenta a large blood clots confirmed the initial suspicion of placental abruption.  Her intra-operative bleeding was reasonable.  She had some mild uterine atony in the OR at the end of the case.  So, misoprostol 800 mcg was placed.  Also, she was given 1 unit pRBCs  intra-operatively due to her low starting hemoglobin (8.0) with large anticipated blood loss.  Her postpartum course was notable for acute blood loss anemia, and postpartum preeclampsia. Her blood pressures were difficult to control and she was discharged home on multiple agents.   Physical exam  Vitals:   01/16/19 1654 01/16/19 1740 01/16/19 1911 01/16/19 2044  BP:  127/73 121/68 112/68  Pulse:  (!) 122 (!) 101 (!) 104  Resp:   18   Temp:   (!) 97.5 F (36.4 C)   TempSrc:   Oral   SpO2: 100%     Weight:      Height:       General: alert, cooperative and no distress Lochia: appropriate Uterine Fundus: firm Incision: Healing well with no significant drainage DVT Evaluation: No evidence of DVT seen on physical exam.  Labs: Lab Results  Component Value Date   WBC 10.8 (H) 01/16/2019   HGB 8.1 (L) 01/16/2019   HCT 24.0 (L) 01/16/2019   MCV 82.5 01/16/2019   PLT 114 (L) 01/16/2019    Discharge instruction: per After Visit Summary.  Medications:  Allergies as of 01/16/2019   No Known Allergies     Medication List  STOP taking these medications   ondansetron 4 MG disintegrating tablet Commonly known as:  ZOFRAN-ODT   promethazine 25 MG tablet Commonly known as:  PHENERGAN     TAKE these medications   acetaminophen 325 MG tablet Commonly known as:  TYLENOL Take 2 tablets (650 mg total) by mouth every 6 (six) hours as needed for mild pain or headache. What changed:    how much to take  when to take this   ALPRAZolam 0.5 MG tablet Commonly known as:  XANAX Take 1 tablet (0.5 mg total) by mouth 2 (two) times daily as needed for anxiety.   ferrous sulfate 325 (65 FE) MG tablet Take 1 tablet (325 mg total) by mouth 2 (two) times daily with a meal. Start taking on:  January 17, 2019   hydrALAZINE 25 MG tablet Commonly known as:  APRESOLINE Take 1 tablet (25 mg total) by mouth every 8 (eight) hours. Start taking on:  January 17, 2019   labetalol 300 MG  tablet Commonly known as:  NORMODYNE Take 2 tablets (600 mg total) by mouth 3 (three) times daily.   NIFEdipine 90 MG 24 hr tablet Commonly known as:  PROCARDIA XL/NIFEDICAL-XL Take 1 tablet (90 mg total) by mouth daily. Start taking on:  January 17, 2019   oxyCODONE-acetaminophen 5-325 MG tablet Commonly known as:  PERCOCET/ROXICET Take 1 tablet by mouth every 6 (six) hours as needed for moderate pain.   prenatal multivitamin Tabs tablet Take 1 tablet by mouth daily at 12 noon.            Discharge Care Instructions  (From admission, onward)         Start     Ordered   01/16/19 0000  Discharge wound care:    Comments:  SHOWER DAILY Wash incision gently with soap and water.  Call office with any drainage, redness, or firmness of the incision.   01/16/19 2117          Diet: routine diet  Activity: Advance as tolerated. Pelvic rest for 6 weeks.   Outpatient follow up: Follow-up Information    Conard NovakJackson, Stephen D, MD. Go in 2 day(s).   Specialty:  Obstetrics and Gynecology Why:  Blood pressure check and Post op incision check Contact information: [address] [place] [address] [phone]             Postpartum contraception: Undecided Rhogam Given postpartum: no Rubella vaccine given postpartum: no Varicella vaccine given postpartum: no TDaP given antepartum or postpartum: 12/27/2018  Newborn Data: Still born child  Birth Weight:    APGAR: 0, 0   Newborn Delivery   Birth date/time:  01/13/2019 08:34:00 Delivery type:  C-Section, Low Transverse Trial of labor:  No C-section categorization:  Primary    Disposition:morgue  SIGNED:  Adelene Idlerhristanna Beryle Zeitz MD [facility], [facility] 01/16/2019 9:17 PM

## 2019-01-13 NOTE — Anesthesia Preprocedure Evaluation (Addendum)
Anesthesia Evaluation  Patient identified by MRN, date of birth, ID band Patient awake    Reviewed: Allergy & Precautions, H&P , NPO status , Patient's Chart, lab work & pertinent test results  History of Anesthesia Complications Negative for: history of anesthetic complications  Airway Mallampati: III  TM Distance: >3 FB Neck ROM: full    Dental  (+) Chipped   Pulmonary neg pulmonary ROS, former smoker,           Cardiovascular Exercise Tolerance: Good hypertension, negative cardio ROS       Neuro/Psych    GI/Hepatic negative GI ROS, GERD  Medicated and Controlled,  Endo/Other    Renal/GU   negative genitourinary   Musculoskeletal   Abdominal   Peds  Hematology   Anesthesia Other Findings   Past Surgical History: 2008, 2012: DILATION AND CURETTAGE OF UTERUS     Comment:  D&E EAB 11/03/2016: DILATION AND CURETTAGE OF UTERUS; N/A     Comment:  Procedure: DILATATION AND CURETTAGE;  Surgeon: Nadara Mustard, MD;  Location: [facility] ORS;  Service: Gynecology;                Laterality: N/A;  BMI    Body Mass Index:  44.11 kg/m      Reproductive/Obstetrics (+) Pregnancy                           Anesthesia Physical Anesthesia Plan  ASA: IV and emergent  Anesthesia Plan: Epidural   Post-op Pain Management:    Induction:   PONV Risk Score and Plan:   Airway Management Planned:   Additional Equipment:   Intra-op Plan:   Post-operative Plan:   Informed Consent: I have reviewed the patients History and Physical, chart, labs and discussed the procedure including the risks, benefits and alternatives for the proposed anesthesia with the patient or authorized representative who has indicated his/her understanding and acceptance.     Dental Advisory Given  Plan Discussed with: Anesthesiologist and CRNA  Anesthesia Plan Comments: (Fetal demise, concern for DIC,  blood products available Plan to repeat CBC post Op and remove epidural based on platelets at that time Patient request that we administer anxiolytics during the case )      Anesthesia Quick Evaluation

## 2019-01-13 NOTE — Progress Notes (Signed)
Verbal order by Dr. Noralyn Pick to administer pf phenylephrine IV for BP 86/52. Recheck BP 99/75. Will continue to monitor.

## 2019-01-13 NOTE — Progress Notes (Addendum)
Ch f/u w/ pt that had a fetal death per request of Ch [place]. Nurse shared that pt was still in the OR but would pg if there were further needs.    01/13/19 1100  Clinical Encounter Type  Visited With Health care provider  Visit Type Follow-up  Referral From Chaplain  Consult/Referral To Chaplain

## 2019-01-13 NOTE — Progress Notes (Signed)
DOS. LTCS/ abruption/ stillbirth/ preeclampsia with severe features Subjective:   No headache, visual changes, CP. CTSP due to severe range blood pressures  Objective:  Blood pressure (!) 163/115, 165/111, pulse 99, temperature 98.4 F (36.9 C), temperature source Oral, resp. rate 19, height  (1.6 m), weight 112.9 kg, last menstrual period 05/12/2018, SpO2 100 %, unknown if currently breastfeeding. Urine output 400 ml in the last hour General: NAD, appears comfortable, awake, answering questions appropriately Heart: RRR without murmur Pulmonary: no increased work of breathing/ CTAB Abdomen: non-distended, non-tender Incision: Dressing C&D&I Extremities: no edema, no erythema, no tenderness  Results for orders placed or performed during the hospital encounter of 01/12/19 (from the past 72 hour(s))  Urine Drug Screen, Qualitative ([facility] only)     Status: Abnormal   Collection Time: 01/12/19 11:15 PM  Result Value Ref Range   Tricyclic, Ur Screen NONE DETECTED NONE DETECTED   Amphetamines, Ur Screen NONE DETECTED NONE DETECTED   MDMA (Ecstasy)Ur Screen NONE DETECTED NONE DETECTED   Cocaine Metabolite,Ur [place] NONE DETECTED NONE DETECTED   Opiate, Ur Screen NONE DETECTED NONE DETECTED   Phencyclidine (PCP) Ur S NONE DETECTED NONE DETECTED   Cannabinoid 50 Ng, Ur [place] POSITIVE (A) NONE DETECTED   Barbiturates, Ur Screen NONE DETECTED NONE DETECTED   Benzodiazepine, Ur Scrn NONE DETECTED NONE DETECTED   Methadone Scn, Ur NONE DETECTED NONE DETECTED    Comment: (NOTE) Tricyclics + metabolites, urine    Cutoff 1000 ng/mL Amphetamines + metabolites, urine  Cutoff 1000 ng/mL MDMA (Ecstasy), urine              Cutoff 500 ng/mL Cocaine Metabolite, urine          Cutoff 300 ng/mL Opiate + metabolites, urine        Cutoff 300 ng/mL Phencyclidine (PCP), urine         Cutoff 25 ng/mL Cannabinoid, urine                 Cutoff 50 ng/mL Barbiturates + metabolites, urine  Cutoff 200  ng/mL Benzodiazepine, urine              Cutoff 200 ng/mL Methadone, urine                   Cutoff 300 ng/mL The urine drug screen provides only a preliminary, unconfirmed analytical test result and should not be used for non-medical purposes. Clinical consideration and professional judgment should be applied to any positive drug screen result due to possible interfering substances. A more specific alternate chemical method must be used in order to obtain a confirmed analytical result. Gas chromatography / mass spectrometry (GC/MS) is the preferred confirmat ory method. Performed at [facility], [address] [place], [place] [address]   Protein / creatinine ratio, urine     Status: None   Collection Time: 01/12/19 11:15 PM  Result Value Ref Range   Creatinine, Urine 462 mg/dL    Comment: RESULTS CONFIRMED BY MANUAL DILUTION   Total Protein, Urine >3,000 mg/dL    Comment: RESULTS CONFIRMED BY MANUAL DILUTION   Protein Creatinine Ratio NOT CALCULATED 0.00 - 0.15 mg/mg[Cre]    Comment: Performed at [facility], [address] [place], [place] [address]  CBC     Status: Abnormal   Collection Time: 01/12/19 11:25 PM  Result Value Ref Range   WBC 12.2 (H) 4.0 - 10.5 K/uL   RBC 3.98 3.87 - 5.11 MIL/uL   Hemoglobin 10.5 (L)  12.0 - 15.0 g/dL   HCT 97.4 (L) 16.3 - 84.5 %   MCV 77.4 (L) 80.0 - 100.0 fL   MCH 26.4 26.0 - 34.0 pg   MCHC 34.1 30.0 - 36.0 g/dL   RDW 36.4 68.0 - 32.1 %   Platelets 237 150 - 400 K/uL   nRBC 0.2 0.0 - 0.2 %    Comment: Performed at [facility], [address] [place], [place] [address]  Kleihauer-Betke stain     Status: None   Collection Time: 01/12/19 11:25 PM  Result Value Ref Range   Fetal Cells % 0 %   Quantitation Fetal Hemoglobin 0.0000 mL   # Vials RhIg NOT INDICATED     Comment: Performed at [facility], [address] [place], [place] [address]  Comprehensive metabolic panel     Status: Abnormal    Collection Time: 01/12/19 11:25 PM  Result Value Ref Range   Sodium 135 135 - 145 mmol/L   Potassium 3.6 3.5 - 5.1 mmol/L   Chloride 107 98 - 111 mmol/L   CO2 17 (L) 22 - 32 mmol/L   Glucose, Bld 138 (H) 70 - 99 mg/dL   BUN 20 6 - 20 mg/dL   Creatinine, Ser 0.48 (H) 0.44 - 1.00 mg/dL   Calcium 7.9 (L) 8.9 - 10.3 mg/dL   Total Protein 5.8 (L) 6.5 - 8.1 g/dL   Albumin 2.4 (L) 3.5 - 5.0 g/dL   AST 45 (H) 15 - 41 U/L   ALT 31 0 - 44 U/L   Alkaline Phosphatase 160 (H) 38 - 126 U/L   Total Bilirubin 0.6 0.3 - 1.2 mg/dL   GFR calc non Af Amer >60 >60 mL/min   GFR calc Af Amer >60 >60 mL/min   Anion gap 11 5 - 15    Comment: Performed at [facility], [address] [place], [place] [address]  Type and screen [facility]     Status: None (Preliminary result)   Collection Time: 01/12/19 11:32 PM  Result Value Ref Range   ABO/RH(D) A POS    Antibody Screen NEG    Sample Expiration      01/15/2019 Performed at [facility], [address] [place], [place] [address]    Unit Number [id number]    Blood Component Type RBC, LR IRR    Unit division 00    Status of Unit REL FROM [facility]    Transfusion Status OK TO TRANSFUSE    Crossmatch Result Compatible    Unit Number [id number]    Blood Component Type RBC, LR IRR    Unit division 00    Status of Unit ISSUED    Transfusion Status OK TO TRANSFUSE    Crossmatch Result Compatible    Unit Number [id number]    Blood Component Type RED CELLS,LR    Unit division 00    Status of Unit ISSUED    Transfusion Status OK TO TRANSFUSE    Crossmatch Result Compatible    Unit Number [id number]    Blood Component Type RED CELLS,LR    Unit division 00    Status of Unit ALLOCATED    Transfusion Status OK TO TRANSFUSE    Crossmatch Result Compatible    Unit Number [id number]    Blood Component Type RED CELLS,LR    Unit division 00    Status of Unit ALLOCATED    Transfusion Status OK TO  TRANSFUSE    Crossmatch Result Compatible  Prepare RBC     Status: None   Collection Time: 01/13/19 12:34 AM  Result Value Ref Range   Order Confirmation      ORDER PROCESSED BY BLOOD BANK Performed at [facility], [address] [place], [place] [address]   CBC     Status: Abnormal   Collection Time: 01/13/19  6:39 AM  Result Value Ref Range   WBC 22.5 (H) 4.0 - 10.5 K/uL   RBC 2.93 (L) 3.87 - 5.11 MIL/uL   Hemoglobin 8.0 (L) 12.0 - 15.0 g/dL   HCT 19.1 (L) 47.8 - 29.5 %   MCV 80.5 80.0 - 100.0 fL   MCH 27.3 26.0 - 34.0 pg   MCHC 33.9 30.0 - 36.0 g/dL   RDW 62.1 30.8 - 65.7 %   Platelets 123 (L) 150 - 400 K/uL    Comment: Immature Platelet Fraction may be clinically indicated, consider ordering this additional test [id number]    nRBC 0.1 0.0 - 0.2 %    Comment: Performed at [facility], [address] [place], [place] [address]  Comprehensive metabolic panel     Status: Abnormal   Collection Time: 01/13/19  6:39 AM  Result Value Ref Range   Sodium 136 135 - 145 mmol/L   Potassium 5.2 (H) 3.5 - 5.1 mmol/L   Chloride 107 98 - 111 mmol/L   CO2 19 (L) 22 - 32 mmol/L   Glucose, Bld 169 (H) 70 - 99 mg/dL   BUN 23 (H) 6 - 20 mg/dL   Creatinine, Ser 2.44 (H) 0.44 - 1.00 mg/dL   Calcium 7.7 (L) 8.9 - 10.3 mg/dL   Total Protein 5.4 (L) 6.5 - 8.1 g/dL   Albumin 2.1 (L) 3.5 - 5.0 g/dL   AST 66 (H) 15 - 41 U/L   ALT 30 0 - 44 U/L   Alkaline Phosphatase 125 38 - 126 U/L   Total Bilirubin 1.4 (H) 0.3 - 1.2 mg/dL   GFR calc non Af Amer 35 (L) >60 mL/min   GFR calc Af Amer 40 (L) >60 mL/min   Anion gap 10 5 - 15    Comment: Performed at [facility], [address] [place], [place] [address]  Protime-INR     Status: None   Collection Time: 01/13/19  6:39 AM  Result Value Ref Range   Prothrombin Time 14.6 11.4 - 15.2 seconds   INR 1.2 0.8 - 1.2    Comment: (NOTE) INR goal varies based on device and disease states. Performed at [facility], [address] [place], [place] [address]   APTT     Status: None   Collection Time: 01/13/19  6:39 AM  Result Value Ref Range   aPTT 34 24 - 36 seconds    Comment: Performed at [facility], [address] [place], [place] [address]  Fibrinogen     Status: Abnormal   Collection Time: 01/13/19  6:39 AM  Result Value Ref Range   Fibrinogen 200 (L) 210 - 475 mg/dL    Comment: Performed at [facility], [address] [place], [place] [address]  Lactate dehydrogenase     Status: Abnormal   Collection Time: 01/13/19  6:45 AM  Result Value Ref Range   LDH 687 (H) 98 - 192 U/L    Comment: Performed at [facility], [address] [place], [place] [address]  CBC     Status: Abnormal   Collection Time: 01/13/19 10:08 AM  Result Value Ref Range   WBC 25.4 (H) 4.0 - 10.5 K/uL   RBC 3.57 (L) 3.87 - 5.11 MIL/uL   Hemoglobin 9.9 (L) 12.0 - 15.0 g/dL   HCT 04.529.3 (L) 40.936.0 - 81.146.0 %   MCV 82.1 80.0 - 100.0 fL   MCH 27.7 26.0 - 34.0 pg   MCHC 33.8 30.0 - 36.0 g/dL   RDW 91.415.5 78.211.5 - 95.615.5 %   Platelets 114 (L) 150 - 400 K/uL    Comment: Immature Platelet Fraction may be clinically indicated, consider ordering this additional test [id number]    nRBC 0.1 0.0 - 0.2 %    Comment: Performed at [facility], [address] [place], [place] [address]  CBC     Status: Abnormal   Collection Time: 01/13/19 12:39 PM  Result Value Ref Range   WBC 20.3 (H) 4.0 - 10.5 K/uL   RBC 3.02 (L) 3.87 - 5.11 MIL/uL   Hemoglobin 8.5 (L) 12.0 - 15.0 g/dL   HCT 29.524.0 (L) 28.436.0 - 13.246.0 %   MCV 79.5 (L) 80.0 - 100.0 fL   MCH 28.1 26.0 - 34.0 pg   MCHC 35.4 30.0 - 36.0 g/dL   RDW 44.014.9 10.211.5 - 72.515.5 %   Platelets 102 (L) 150 - 400 K/uL    Comment: Immature Platelet Fraction may be clinically indicated, consider ordering this additional test [id number]    nRBC 0.1 0.0 - 0.2 %    Comment: Performed at [facility], [address] [place], [place] [address]   Comprehensive metabolic panel     Status: Abnormal   Collection Time: 01/13/19 12:39 PM  Result Value Ref Range   Sodium 136 135 - 145 mmol/L   Potassium 5.3 (H) 3.5 - 5.1 mmol/L   Chloride 108 98 - 111 mmol/L   CO2 20 (L) 22 - 32 mmol/L   Glucose, Bld 139 (H) 70 - 99 mg/dL   BUN 27 (H) 6 - 20 mg/dL   Creatinine, Ser 5.632.15 (H) 0.44 - 1.00 mg/dL   Calcium 7.2 (L) 8.9 - 10.3 mg/dL   Total Protein 4.7 (L) 6.5 - 8.1 g/dL   Albumin 1.8 (L) 3.5 - 5.0 g/dL   AST 65 (H) 15 - 41 U/L   ALT 31 0 - 44 U/L   Alkaline Phosphatase 101 38 - 126 U/L   Total Bilirubin 0.6 0.3 - 1.2 mg/dL   GFR calc non Af Amer 30 (L) >60 mL/min   GFR calc Af Amer 35 (L) >60 mL/min   Anion gap 8 5 - 15    Comment: Performed at [facility], [address] [place], [place] [address]  Prepare RBC     Status: None   Collection Time: 01/13/19  2:55 PM  Result Value Ref Range   Order Confirmation      ORDER PROCESSED BY BLOOD BANK Performed at [facility], [address] [place], [place] [address]   Magnesium     Status: Abnormal   Collection Time: 01/13/19  5:07 PM  Result Value Ref Range   Magnesium 3.7 (H) 1.7 - 2.4 mg/dL    Comment: Performed at [facility], [address] [place], [place] [address]  Lactate dehydrogenase     Status: Abnormal   Collection Time: 01/13/19  5:07 PM  Result Value Ref Range   LDH 640 (H) 98 - 192 U/L    Comment: Performed at [facility], [address] [place], [place] [address]  Assessment/ Plan:   28 y.o. [id number] postoperativeday # 0 with severe range blood pressures  Labetalol 20 mgm IV now  Closely monitor blood pressures and give labetalol IV as needed  Will probably need to start on oral labetalol also in the AM.  Farrel Conners, CNM

## 2019-01-14 LAB — TYPE AND SCREEN
ABO/RH(D): A POS
Antibody Screen: NEGATIVE
Unit division: 0
Unit division: 0
Unit division: 0
Unit division: 0
Unit division: 0

## 2019-01-14 LAB — CARDIOLIPIN ANTIBODIES, IGM+IGG
Anticardiolipin IgG: <9 GPL U/mL (ref 0–14)
Anticardiolipin IgM: <9 MPL U/mL (ref 0–12)

## 2019-01-14 LAB — BPAM RBC
Blood Product Expiration Date: 202004222359
Blood Product Expiration Date: 202004222359
Blood Product Expiration Date: 202004282359
Blood Product Expiration Date: 202004282359
Blood Product Expiration Date: 202004282359
ISSUE DATE / TIME: 202004180836
ISSUE DATE / TIME: 202004180836
ISSUE DATE / TIME: 202004181431
Unit Type and Rh: 6200
Unit Type and Rh: 6200
Unit Type and Rh: 6200
Unit Type and Rh: 6200
Unit Type and Rh: 6200

## 2019-01-14 LAB — COMPREHENSIVE METABOLIC PANEL
ALT: 32 U/L (ref 0–44)
AST: 56 U/L — ABNORMAL HIGH (ref 15–41)
Albumin: 1.8 g/dL — ABNORMAL LOW (ref 3.5–5.0)
Alkaline Phosphatase: 105 U/L (ref 38–126)
Anion gap: 6 (ref 5–15)
BUN: 23 mg/dL — ABNORMAL HIGH (ref 6–20)
CO2: 21 mmol/L — ABNORMAL LOW (ref 22–32)
Calcium: 7.4 mg/dL — ABNORMAL LOW (ref 8.9–10.3)
Chloride: 106 mmol/L (ref 98–111)
Creatinine, Ser: 1.82 mg/dL — ABNORMAL HIGH (ref 0.44–1.00)
GFR calc Af Amer: 43 mL/min — ABNORMAL LOW (ref >60)
GFR calc non Af Amer: 37 mL/min — ABNORMAL LOW (ref >60)
Glucose, Bld: 93 mg/dL (ref 70–99)
Potassium: 3.9 mmol/L (ref 3.5–5.1)
Sodium: 133 mmol/L — ABNORMAL LOW (ref 135–145)
Total Bilirubin: 0.5 mg/dL (ref 0.3–1.2)
Total Protein: 4.9 g/dL — ABNORMAL LOW (ref 6.5–8.1)

## 2019-01-14 LAB — CBC
HCT: 24.8 % — ABNORMAL LOW (ref 36.0–46.0)
HCT: 24.6 % — ABNORMAL LOW (ref 36.0–46.0)
Hemoglobin: 8.8 g/dL — ABNORMAL LOW (ref 12.0–15.0)
Hemoglobin: 8.6 g/dL — ABNORMAL LOW (ref 12.0–15.0)
MCH: 28.3 pg (ref 26.0–34.0)
MCH: 28.3 pg (ref 26.0–34.0)
MCHC: 35.5 g/dL (ref 30.0–36.0)
MCHC: 35 g/dL (ref 30.0–36.0)
MCV: 79.7 fL — ABNORMAL LOW (ref 80.0–100.0)
MCV: 80.9 fL (ref 80.0–100.0)
Platelets: 80 10*3/uL — ABNORMAL LOW (ref 150–400)
Platelets: 81 10*3/uL — ABNORMAL LOW (ref 150–400)
RBC: 3.11 MIL/uL — ABNORMAL LOW (ref 3.87–5.11)
RBC: 3.04 MIL/uL — ABNORMAL LOW (ref 3.87–5.11)
RDW: 14.9 % (ref 11.5–15.5)
RDW: 15.4 % (ref 11.5–15.5)
WBC: 17.6 10*3/uL — ABNORMAL HIGH (ref 4.0–10.5)
WBC: 16.3 10*3/uL — ABNORMAL HIGH (ref 4.0–10.5)
nRBC: 0.1 % (ref 0.0–0.2)
nRBC: 0 % (ref 0.0–0.2)

## 2019-01-14 LAB — MAGNESIUM
Magnesium: 4.5 mg/dL — ABNORMAL HIGH (ref 1.7–2.4)
Magnesium: 4.9 mg/dL — ABNORMAL HIGH (ref 1.7–2.4)

## 2019-01-14 LAB — CREATININE, SERUM
Creatinine, Ser: 1.59 mg/dL — ABNORMAL HIGH (ref 0.44–1.00)
GFR calc Af Amer: 51 mL/min — ABNORMAL LOW (ref >60)
GFR calc non Af Amer: 44 mL/min — ABNORMAL LOW (ref >60)

## 2019-01-14 LAB — LUPUS ANTICOAGULANT PANEL
DRVVT: 31.5 s (ref 0.0–47.0)
PTT Lupus Anticoagulant: 31.5 s (ref 0.0–51.9)

## 2019-01-14 LAB — BASIC METABOLIC PANEL
Anion gap: 8 (ref 5–15)
BUN: 24 mg/dL — ABNORMAL HIGH (ref 6–20)
CO2: 21 mmol/L — ABNORMAL LOW (ref 22–32)
Calcium: 7.3 mg/dL — ABNORMAL LOW (ref 8.9–10.3)
Chloride: 103 mmol/L (ref 98–111)
Creatinine, Ser: 1.92 mg/dL — ABNORMAL HIGH (ref 0.44–1.00)
GFR calc Af Amer: 40 mL/min — ABNORMAL LOW (ref >60)
GFR calc non Af Amer: 35 mL/min — ABNORMAL LOW (ref >60)
Glucose, Bld: 116 mg/dL — ABNORMAL HIGH (ref 70–99)
Potassium: 3.6 mmol/L (ref 3.5–5.1)
Sodium: 132 mmol/L — ABNORMAL LOW (ref 135–145)

## 2019-01-14 LAB — BETA-2-GLYCOPROTEIN I ABS, IGG/M/A
Beta-2 Glyco I IgG: <9 GPI IgG units (ref 0–20)
Beta-2-Glycoprotein I IgA: <9 GPI IgA units (ref 0–25)
Beta-2-Glycoprotein I IgM: <9 GPI IgM units (ref 0–32)

## 2019-01-14 LAB — RPR: RPR Ser Ql: NONREACTIVE

## 2019-01-14 MED ORDER — LABETALOL HCL 100 MG PO TABS
200.0000 mg | ORAL_TABLET | Freq: Three times a day (TID) | ORAL | Status: DC
  Administered 2019-01-14 (×3): 200 mg via ORAL
  Filled 2019-01-14 (×4): qty 2

## 2019-01-14 MED ORDER — BACITRACIN-NEOMYCIN-POLYMYXIN OINTMENT TUBE
TOPICAL_OINTMENT | CUTANEOUS | Status: DC | PRN
  Administered 2019-01-14: 12:00:00 via TOPICAL
  Filled 2019-01-14: qty 14.17

## 2019-01-14 MED ORDER — ACETAMINOPHEN 325 MG PO TABS
650.0000 mg | ORAL_TABLET | Freq: Four times a day (QID) | ORAL | Status: AC | PRN
  Administered 2019-01-14 – 2019-01-15 (×3): 650 mg via ORAL
  Filled 2019-01-14 (×3): qty 2

## 2019-01-14 MED ORDER — OXYCODONE HCL 5 MG PO TABS
5.0000 mg | ORAL_TABLET | ORAL | Status: AC | PRN
  Administered 2019-01-14 – 2019-01-15 (×3): 5 mg via ORAL
  Filled 2019-01-14 (×3): qty 1

## 2019-01-14 NOTE — Progress Notes (Signed)
POD #1 LTCS for abruption/ hemodynamically unstable.  Stillbirth at 35 weeks. Preeclampsia with severe features. Subjective:  Denies headache, CP, visual changes and SOB. Tolerating regular diet.Passing flatus. OOB after magnesium discontinued-denies lightheadedness.    Objective:  Blood pressure 143/97. BPs since starting po 200 mgm po labetalol tid: 143/106, 127/91, 124/93, 140/97, 130/101, 126/76, 133/90/143/97 Pulse 90-100 Urine output 4924 ml over last 24 hours and 1755 since 0700 this AM.  Magnesium sulfate discontinued at 1300 today  General: appears tired, but in NAD. Sitting in chair holding baby Heart: regular rate Pulmonary: no increased work of breathing  . Recent Labs    01/12/19 2315  01/13/19 1239 01/13/19 2349 01/14/19 0537 01/14/19 1707  WBC  --    < > 20.3*  --  17.6* 16.3*  HGB  --    < > 8.5*  --  8.8* 8.6*  HCT  --    < > 24.0*  --  24.8* 24.6*  PLT  --    < > 102*  --  80* 81*  CREATININE  --    < > 2.15* 1.92* 1.82* 1.59*  PROTCRRATIO NOT CALCULATED  --   --   --   --   --    < > = values in this interval not displayed.     Assessment:     28 y.o. [id number] postoperativeday # 1. Continue post operative care  Regular diet  OOB in a chair today/ ambulating in room  Voiding after foley discontinued  Will transfer to [facility] unit  Is currently receiving Mefoxin IV-that will be completed tomorrow AM  1) Preeclampsia with severe features based upon severe blood pressures/elevated creatinine  Has had severe range blood pressures over night necessitating IV labetalol x 3 since 2330  last night. Blood pressures in the mild range since beginning po labetalol. Continues to  diures. Creatinine level is decreasing and now 1.59                      2)Acute blood loss anemia - received 2 units of blood yesterday  Hemoglobin is stable now 8.6 gm/dl  Is asymptomatic  Continue iron and vitamins  CBC in Am  3) Acute renal failure: creatinine decreasing and now 1.59  Good urine output. Continue with NS. Repeat BMP in AM   4) Stillbirth- patient holding baby intermittently and grieving appropriately. Family planning burial arrangements  5) Thrombocytopenia-platelets 80K this AM and 81K 12 hours later  6) TDAP status - given 12/27/2018  7) DVT prophylaxis: now ambulating in room  8) A POS/ RI/ VI  Farrel Conners, CNM

## 2019-01-14 NOTE — Progress Notes (Signed)
[employer] [facility] [place] [facility]     PATIENT NAME: Sierra Jordan    MR#:  [id number]  DATE OF BIRTH:  Sep 21, 1991  SUBJECTIVE:   She was  admitted to the OB/GYN service status post placental abruption and stillbirth.  Noted to have accelerated hypertension with acute kidney injury.  Blood pressure has improved since yesterday, creatinine is trending down with fluids.  Patient denies any headache, nausea, vomiting.  REVIEW OF SYSTEMS:    Review of Systems  Constitutional: Negative for chills and fever.  HENT: Negative for congestion and tinnitus.   Eyes: Negative for blurred vision and double vision.  Respiratory: Negative for cough, shortness of breath and wheezing.   Cardiovascular: Negative for chest pain, orthopnea and PND.  Gastrointestinal: Negative for abdominal pain, diarrhea, nausea and vomiting.  Genitourinary: Negative for dysuria and hematuria.  Neurological: Negative for dizziness, sensory change and focal weakness.  All other systems reviewed and are negative.   Nutrition: Regular Tolerating Diet: Yes Tolerating PT: Ambulatory.   DRUG ALLERGIES:  No Known Allergies  VITALS:  Blood pressure (!) 130/101, pulse 89, temperature 98.1 F (36.7 C), temperature source Oral, resp. rate 15, height 5\' 3"  (1.6 m), weight 112.9 kg, last menstrual period 05/12/2018, SpO2 99 %, unknown if currently breastfeeding.  PHYSICAL EXAMINATION:   Physical Exam  GENERAL:  28 y.o.-year-old patient lying in bed in no acute distress.  EYES: Pupils equal, round, reactive to light and accommodation. No scleral icterus. Extraocular muscles intact.  HEENT: Head atraumatic, normocephalic. Oropharynx and nasopharynx clear.  NECK:  Supple, no jugular venous distention. No thyroid enlargement, no tenderness.  LUNGS: Normal breath sounds bilaterally, no wheezing, rales, rhonchi. No use of accessory muscles of respiration.  CARDIOVASCULAR: S1, S2 normal. No murmurs, rubs, or  gallops.  ABDOMEN: Soft, nontender, nondistended. Bowel sounds present. No organomegaly or mass.  EXTREMITIES: No cyanosis, clubbing or edema b/l.    NEUROLOGIC: Cranial nerves II through XII are intact. No focal Motor or sensory deficits b/l.   PSYCHIATRIC: The patient is alert and oriented x 3.  SKIN: No obvious rash, lesion, or ulcer.    LABORATORY PANEL:   CBC Recent Labs  Lab 01/14/19 0537  WBC 17.6*  HGB 8.8*  HCT 24.8*  PLT 80*   ------------------------------------------------------------------------------------------------------------------  Chemistries  Recent Labs  Lab 01/14/19 0537  NA 133*  K 3.9  CL 106  CO2 21*  GLUCOSE 93  BUN 23*  CREATININE 1.82*  CALCIUM 7.4*  MG 4.9*  AST 56*  ALT 32  ALKPHOS 105  BILITOT 0.5   ------------------------------------------------------------------------------------------------------------------  Cardiac Enzymes No results for input(s): TROPONINI in the last 168 hours. ------------------------------------------------------------------------------------------------------------------  RADIOLOGY:  US Renal  Result Date: 01/13/2019 CLINICAL DATA:  28 year old female with acute renal failure. Patient underwent C-section this morning. EXAM: RENAL / URINARY TRACT ULTRASOUND COMPLETE COMPARISON:  None. FINDINGS: Right Kidney: Renal measurements: 14 x 6.5 x 6.5 cm = volume: 313 mL. Echogenicity is within normal limits. Mild RIGHT hydronephrosis noted. No RIGHT renal mass identified. Left Kidney: Renal measurements: 12.7 x 6.3 x 5.4 cm = volume: 228 mL. Echogenicity within normal limits. No mass or hydronephrosis visualized. Bladder: Not identified due to C-section changes. IMPRESSION: 1. Mild RIGHT hydronephrosis, which may be secondary to recent pregnancy. 2. No other renal abnormalities identified. 3. Bladder not visualized. Electronically Signed   By: Harmon Pier M.D.   On: 01/13/2019 18:22   Dg Chest Port 1 View  Result  Date: 01/13/2019 CLINICAL  DATA:  Status post C-section, evaluate for pneumonia EXAM: PORTABLE CHEST 1 VIEW COMPARISON:  07/15/2018 FINDINGS: The heart size and mediastinal contours are within normal limits. Both lungs are clear. The visualized skeletal structures are unremarkable. IMPRESSION: No active disease. Electronically Signed   By: Elige KoHetal  Patel   On: 01/13/2019 15:24     ASSESSMENT AND PLAN:   28 year old female who was admitted to the hospital on the OB/GYN service secondary to placental abruption secondary to preeclampsia and postoperatively after C-section noted to have accelerated hypertension and acute kidney injury.  1.  Accelerated hypertension-secondary to underlying preeclampsia. -Patient likely has underlying hypertension which has not been treated. -Blood pressures much improved since yesterday.  Continue IV labetalol, hydralazine as needed. -Continue oral labetalol and could taper dose if blood pressure drops too low.  Target blood pressure should be around 130s systolic to diastolics in the 21H80s to low 90s.  2.  Acute kidney injury- secondary to underlying preeclampsia -Improving with IV fluid hydration.  Continue normal saline for now.  Renal ultrasound negative for obstruction. - Renal dose meds, avoid nephrotoxins.  3.  Placental abruption with stillbirth  -continue further care as per OB/GYN. -Continue pain control, Pitocin, magnesium as per OB/GYN. - Clinically patient is improving.  4.  Thrombocytopenia-secondary to the preeclampsia. - We will continue to monitor.  No acute bleeding presently.  5.  Leukocytosis-secondary to the preeclampsia and stress mediated. -No acute source of infection.  Continue empiric antibiotics as per OB/GYN.  6.  Anemia-this is anemia secondary to patient's underlying preeclampsia/pregnancy. -LDH was elevated.  No acute bleeding presently.  We will continue to monitor serial hemoglobin.   All the records are reviewed and case  discussed with Care Management/Social Worker. Management plans discussed with the patient, family and they are in agreement.  CODE STATUS: Full code  DVT Prophylaxis: Ambulatory/Ted's SCD's.   TOTAL TIME TAKING CARE OF THIS PATIENT: 30 minutes.   POSSIBLE D/C IN 1-2 DAYS, DEPENDING ON CLINICAL CONDITION.   Houston SirenVivek J Sainani M.D on 01/14/2019 at 1:58 PM  Between 7am to 6pm - Pager - [phone]  After 6pm go to [email or web address] - Social research officer, [email or web address] [email or web address]  [employer] [facility] [place] [facility]  Office  [phone]  CC: Primary care physician; Patient, No Pcp Per

## 2019-01-14 NOTE — Progress Notes (Signed)
POD #1 LTCS for abruption/ hemodynamically unstable.  Stillbirth at 35 weeks. Preeclampsia with severe features. Subjective:  Denies headache, CP, visual changes and SOB. Tolerating regular diet. Has not passed flatus yet.   Objective:  Blood pressure (!) 141/99, pulse 99, temperature 98.6 F (37 C), temperature source Oral, resp. rate 13, height 5\' 3"  (1.6 m), weight 112.9 kg, last menstrual period 05/12/2018, SpO2 99 %, unknown if currently breastfeeding. Temp:  [97.6 F (36.4 C)-98.6 F (37 C)] 98.6 F (37 C) (04/19 0300) Pulse Rate:  [61-140] 99 (04/19 0910) Resp:  [0-33] 13 (04/19 0910) BP: (120-176)/(56-126) 141/99 (04/19 0910) SpO2:  [69 %-100 %] 99 % (04/19 0910)  Was given 60 mgm of labetalol IV around 0500 and another 20 mgm IV around 0845 for severe range blood pressures. Also started on 200 mgm labetalol tid at 0830 General: appears tired, but in NAD Heart: RRR without murmur Pulmonary: no increased work of breathing/ CTAB Abdomen: non-distended, non-tender, fundus firm , bowel sounds active Incision: Dressing C&D&I Extremities: no edema, no erythema, no tenderness.  Neuro: answering questions appropriately. DTRs +1, no clonus  Results for orders placed or performed during the hospital encounter of 01/12/19 (from the past 72 hour(s))  Urine Drug Screen, Qualitative ([facility] only)     Status: Abnormal   Collection Time: 01/12/19 11:15 PM  Result Value Ref Range   Tricyclic, Ur Screen NONE DETECTED NONE DETECTED   Amphetamines, Ur Screen NONE DETECTED NONE DETECTED   MDMA (Ecstasy)Ur Screen NONE DETECTED NONE DETECTED   Cocaine Metabolite,Ur [place] NONE DETECTED NONE DETECTED   Opiate, Ur Screen NONE DETECTED NONE DETECTED   Phencyclidine (PCP) Ur S NONE DETECTED NONE DETECTED   Cannabinoid 50 Ng, Ur [place] POSITIVE (A) NONE DETECTED   Barbiturates, Ur Screen NONE DETECTED NONE DETECTED   Benzodiazepine, Ur Scrn NONE DETECTED NONE DETECTED   Methadone Scn, Ur NONE DETECTED NONE  DETECTED    Comment: (NOTE) Tricyclics + metabolites, urine    Cutoff 1000 ng/mL Amphetamines + metabolites, urine  Cutoff 1000 ng/mL MDMA (Ecstasy), urine              Cutoff 500 ng/mL Cocaine Metabolite, urine          Cutoff 300 ng/mL Opiate + metabolites, urine        Cutoff 300 ng/mL Phencyclidine (PCP), urine         Cutoff 25 ng/mL Cannabinoid, urine                 Cutoff 50 ng/mL Barbiturates + metabolites, urine  Cutoff 200 ng/mL Benzodiazepine, urine              Cutoff 200 ng/mL Methadone, urine                   Cutoff 300 ng/mL The urine drug screen provides only a preliminary, unconfirmed analytical test result and should not be used for non-medical purposes. Clinical consideration and professional judgment should be applied to any positive drug screen result due to possible interfering substances. A more specific alternate chemical method must be used in order to obtain a confirmed analytical result. Gas chromatography / mass spectrometry (GC/MS) is the preferred confirmat ory method. Performed at [facility], [address] [place], [place] [address]   Protein / creatinine ratio, urine     Status: None   Collection Time: 01/12/19 11:15 PM  Result Value Ref Range   Creatinine, Urine 462 mg/dL    Comment: RESULTS CONFIRMED  BY MANUAL DILUTION   Total Protein, Urine >3,000 mg/dL    Comment: RESULTS CONFIRMED BY MANUAL DILUTION   Protein Creatinine Ratio NOT CALCULATED 0.00 - 0.15 mg/mg[Cre]    Comment: Performed at [facility], [address] [place], [place] [address]  CBC     Status: Abnormal   Collection Time: 01/12/19 11:25 PM  Result Value Ref Range   WBC 12.2 (H) 4.0 - 10.5 K/uL   RBC 3.98 3.87 - 5.11 MIL/uL   Hemoglobin 10.5 (L) 12.0 - 15.0 g/dL   HCT 60.4 (L) 54.0 - 98.1 %   MCV 77.4 (L) 80.0 - 100.0 fL   MCH 26.4 26.0 - 34.0 pg   MCHC 34.1 30.0 - 36.0 g/dL   RDW 19.1 47.8 - 29.5 %   Platelets 237 150 - 400 K/uL   nRBC 0.2 0.0  - 0.2 %    Comment: Performed at [facility], [address] [place], [place] [address]  RPR     Status: None   Collection Time: 01/12/19 11:25 PM  Result Value Ref Range   RPR Ser Ql Non Reactive Non Reactive    Comment: (NOTE) Performed At: [facility] [address] [place], [place] [id number] Jolene Schimke MD EX:[id number]   Kleihauer-Betke stain     Status: None   Collection Time: 01/12/19 11:25 PM  Result Value Ref Range   Fetal Cells % 0 %   Quantitation Fetal Hemoglobin 0.0000 mL   # Vials RhIg NOT INDICATED     Comment: Performed at [facility], [address] [place], [place] [address]  Comprehensive metabolic panel     Status: Abnormal   Collection Time: 01/12/19 11:25 PM  Result Value Ref Range   Sodium 135 135 - 145 mmol/L   Potassium 3.6 3.5 - 5.1 mmol/L   Chloride 107 98 - 111 mmol/L   CO2 17 (L) 22 - 32 mmol/L   Glucose, Bld 138 (H) 70 - 99 mg/dL   BUN 20 6 - 20 mg/dL   Creatinine, Ser 5.36 (H) 0.44 - 1.00 mg/dL   Calcium 7.9 (L) 8.9 - 10.3 mg/dL   Total Protein 5.8 (L) 6.5 - 8.1 g/dL   Albumin 2.4 (L) 3.5 - 5.0 g/dL   AST 45 (H) 15 - 41 U/L   ALT 31 0 - 44 U/L   Alkaline Phosphatase 160 (H) 38 - 126 U/L   Total Bilirubin 0.6 0.3 - 1.2 mg/dL   GFR calc non Af Amer >60 >60 mL/min   GFR calc Af Amer >60 >60 mL/min   Anion gap 11 5 - 15    Comment: Performed at [facility], [address] [place], [place] [address]  Type and screen [facility]     Status: None   Collection Time: 01/12/19 11:32 PM  Result Value Ref Range   ABO/RH(D) A POS    Antibody Screen NEG    Sample Expiration 01/15/2019    Unit Number [id number]    Blood Component Type RBC, LR IRR    Unit division 00    Status of Unit REL FROM [facility]    Transfusion Status OK TO TRANSFUSE    Crossmatch Result Compatible    Unit Number [id number]    Blood Component Type RBC, LR IRR    Unit division 00    Status of Unit  ISSUED,FINAL    Transfusion Status OK TO TRANSFUSE    Crossmatch Result  Compatible Performed at [facility], [address] [place], [place] [address]    Unit Number [id number]    Blood Component Type RED CELLS,LR    Unit division 00    Status of Unit ISSUED,FINAL    Transfusion Status OK TO TRANSFUSE    Crossmatch Result Compatible    Unit Number [id number]    Blood Component Type RED CELLS,LR    Unit division 00    Status of Unit REL FROM [facility]    Transfusion Status OK TO TRANSFUSE    Crossmatch Result Compatible    Unit Number [id number]    Blood Component Type RED CELLS,LR    Unit division 00    Status of [facility]    Transfusion Status OK TO TRANSFUSE    Crossmatch Result Compatible   Prepare RBC     Status: None   Collection Time: 01/13/19 12:34 AM  Result Value Ref Range   Order Confirmation      ORDER PROCESSED BY BLOOD BANK Performed at [facility], [address] [place], [place] [address]   CBC     Status: Abnormal   Collection Time: 01/13/19  6:39 AM  Result Value Ref Range   WBC 22.5 (H) 4.0 - 10.5 K/uL   RBC 2.93 (L) 3.87 - 5.11 MIL/uL   Hemoglobin 8.0 (L) 12.0 - 15.0 g/dL   HCT 10.223.6 (L) 72.536.0 - 36.646.0 %   MCV 80.5 80.0 - 100.0 fL   MCH 27.3 26.0 - 34.0 pg   MCHC 33.9 30.0 - 36.0 g/dL   RDW 44.015.1 34.711.5 - 42.515.5 %   Platelets 123 (L) 150 - 400 K/uL    Comment: Immature Platelet Fraction may be clinically indicated, consider ordering this additional test [id number]    nRBC 0.1 0.0 - 0.2 %    Comment: Performed at [facility], [address] [place], [place] [address]  Comprehensive metabolic panel     Status: Abnormal   Collection Time: 01/13/19  6:39 AM  Result Value Ref Range   Sodium 136 135 - 145 mmol/L   Potassium 5.2 (H) 3.5 - 5.1 mmol/L   Chloride 107 98 - 111 mmol/L   CO2 19 (L) 22 - 32 mmol/L   Glucose, Bld 169 (H) 70 - 99 mg/dL   BUN 23 (H) 6 - 20 mg/dL   Creatinine, Ser 5.181.92 (H) 0.44 -  1.00 mg/dL   Calcium 7.7 (L) 8.9 - 10.3 mg/dL   Total Protein 5.4 (L) 6.5 - 8.1 g/dL   Albumin 2.1 (L) 3.5 - 5.0 g/dL   AST 66 (H) 15 - 41 U/L   ALT 30 0 - 44 U/L   Alkaline Phosphatase 125 38 - 126 U/L   Total Bilirubin 1.4 (H) 0.3 - 1.2 mg/dL   GFR calc non Af Amer 35 (L) >60 mL/min   GFR calc Af Amer 40 (L) >60 mL/min   Anion gap 10 5 - 15    Comment: Performed at [facility], [address] [place], [place] [address]  Protime-INR     Status: None   Collection Time: 01/13/19  6:39 AM  Result Value Ref Range   Prothrombin Time 14.6 11.4 - 15.2 seconds   INR 1.2 0.8 - 1.2    Comment: (NOTE) INR goal varies based on device and disease states. Performed at [facility], [address] [place], [place] [address]   APTT     Status: None   Collection  Time: 01/13/19  6:39 AM  Result Value Ref Range   aPTT 34 24 - 36 seconds    Comment: Performed at [facility], [address] [place], [place] [address]  Fibrinogen     Status: Abnormal   Collection Time: 01/13/19  6:39 AM  Result Value Ref Range   Fibrinogen 200 (L) 210 - 475 mg/dL    Comment: Performed at [facility], [address] [place], [place] [address]  Lactate dehydrogenase     Status: Abnormal   Collection Time: 01/13/19  6:45 AM  Result Value Ref Range   LDH 687 (H) 98 - 192 U/L    Comment: Performed at [facility], [address] [place], [place] [address]  CBC     Status: Abnormal   Collection Time: 01/13/19 10:08 AM  Result Value Ref Range   WBC 25.4 (H) 4.0 - 10.5 K/uL   RBC 3.57 (L) 3.87 - 5.11 MIL/uL   Hemoglobin 9.9 (L) 12.0 - 15.0 g/dL   HCT 91.4 (L) 78.2 - 95.6 %   MCV 82.1 80.0 - 100.0 fL   MCH 27.7 26.0 - 34.0 pg   MCHC 33.8 30.0 - 36.0 g/dL   RDW 21.3 08.6 - 57.8 %   Platelets 114 (L) 150 - 400 K/uL    Comment: Immature Platelet Fraction may be clinically indicated, consider ordering this additional test [id number]    nRBC 0.1 0.0 - 0.2 %     Comment: Performed at [facility], [address] [place], [place] [address]  CBC     Status: Abnormal   Collection Time: 01/13/19 12:39 PM  Result Value Ref Range   WBC 20.3 (H) 4.0 - 10.5 K/uL   RBC 3.02 (L) 3.87 - 5.11 MIL/uL   Hemoglobin 8.5 (L) 12.0 - 15.0 g/dL   HCT 44.0 (L) 10.2 - 72.5 %   MCV 79.5 (L) 80.0 - 100.0 fL   MCH 28.1 26.0 - 34.0 pg   MCHC 35.4 30.0 - 36.0 g/dL   RDW 36.6 44.0 - 34.7 %   Platelets 102 (L) 150 - 400 K/uL    Comment: Immature Platelet Fraction may be clinically indicated, consider ordering this additional test [id number]    nRBC 0.1 0.0 - 0.2 %    Comment: Performed at [facility], [address] [place], [place] [address]  Comprehensive metabolic panel     Status: Abnormal   Collection Time: 01/13/19 12:39 PM  Result Value Ref Range   Sodium 136 135 - 145 mmol/L   Potassium 5.3 (H) 3.5 - 5.1 mmol/L   Chloride 108 98 - 111 mmol/L   CO2 20 (L) 22 - 32 mmol/L   Glucose, Bld 139 (H) 70 - 99 mg/dL   BUN 27 (H) 6 - 20 mg/dL   Creatinine, Ser 3.29 (H) 0.44 - 1.00 mg/dL   Calcium 7.2 (L) 8.9 - 10.3 mg/dL   Total Protein 4.7 (L) 6.5 - 8.1 g/dL   Albumin 1.8 (L) 3.5 - 5.0 g/dL   AST 65 (H) 15 - 41 U/L   ALT 31 0 - 44 U/L   Alkaline Phosphatase 101 38 - 126 U/L   Total Bilirubin 0.6 0.3 - 1.2 mg/dL   GFR calc non Af Amer 30 (L) >60 mL/min   GFR calc Af Amer 35 (L) >60 mL/min   Anion gap 8 5 - 15    Comment: Performed at [facility], [address], [place], [place] [address]  Prepare RBC     Status: None   Collection Time: 01/13/19  2:55 PM  Result Value Ref Range   Order Confirmation      ORDER PROCESSED BY BLOOD BANK Performed at [facility], [address] [place], [place] [address]   Magnesium     Status: Abnormal   Collection Time: 01/13/19  5:07 PM  Result Value Ref Range   Magnesium 3.7 (H) 1.7 - 2.4 mg/dL    Comment: Performed at [facility], [address] [place],  [place] [address]  Lactate dehydrogenase     Status: Abnormal   Collection Time: 01/13/19  5:07 PM  Result Value Ref Range   LDH 640 (H) 98 - 192 U/L    Comment: Performed at [facility], [address] [place], [place] [address]  Magnesium     Status: Abnormal   Collection Time: 01/13/19 11:49 PM  Result Value Ref Range   Magnesium 4.5 (H) 1.7 - 2.4 mg/dL    Comment: Performed at [facility], [address] [place], [place] [address]  Basic metabolic panel     Status: Abnormal   Collection Time: 01/13/19 11:49 PM  Result Value Ref Range   Sodium 132 (L) 135 - 145 mmol/L   Potassium 3.6 3.5 - 5.1 mmol/L   Chloride 103 98 - 111 mmol/L   CO2 21 (L) 22 - 32 mmol/L   Glucose, Bld 116 (H) 70 - 99 mg/dL   BUN 24 (H) 6 - 20 mg/dL   Creatinine, Ser 8.46 (H) 0.44 - 1.00 mg/dL   Calcium 7.3 (L) 8.9 - 10.3 mg/dL   GFR calc non Af Amer 35 (L) >60 mL/min   GFR calc Af Amer 40 (L) >60 mL/min   Anion gap 8 5 - 15    Comment: Performed at [facility], [address] [place], [place] [address]  Magnesium     Status: Abnormal   Collection Time: 01/14/19  5:37 AM  Result Value Ref Range   Magnesium 4.9 (H) 1.7 - 2.4 mg/dL    Comment: Performed at [facility], [address] [place], [place] [address]  Comprehensive metabolic panel     Status: Abnormal   Collection Time: 01/14/19  5:37 AM  Result Value Ref Range   Sodium 133 (L) 135 - 145 mmol/L   Potassium 3.9 3.5 - 5.1 mmol/L   Chloride 106 98 - 111 mmol/L   CO2 21 (L) 22 - 32 mmol/L   Glucose, Bld 93 70 - 99 mg/dL   BUN 23 (H) 6 - 20 mg/dL   Creatinine, Ser 2.44 (H) 0.44 - 1.00 mg/dL   Calcium 7.4 (L) 8.9 - 10.3 mg/dL   Total Protein 4.9 (L) 6.5 - 8.1 g/dL   Albumin 1.8 (L) 3.5 - 5.0 g/dL   AST 56 (H) 15 - 41 U/L   ALT 32 0 - 44 U/L   Alkaline Phosphatase 105 38 - 126 U/L   Total Bilirubin 0.5 0.3 - 1.2 mg/dL   GFR calc non Af Amer 37 (L) >60 mL/min   GFR calc Af Amer 43 (L) >60 mL/min   Anion  gap 6 5 - 15    Comment: Performed at [facility], [address] [place], [place] [address]  CBC     Status: Abnormal   Collection Time: 01/14/19  5:37 AM  Result Value Ref Range   WBC 17.6 (H) 4.0 - 10.5 K/uL   RBC 3.11 (  L) 3.87 - 5.11 MIL/uL   Hemoglobin 8.8 (L) 12.0 - 15.0 g/dL   HCT 62.9 (L) 52.8 - 41.3 %   MCV 79.7 (L) 80.0 - 100.0 fL   MCH 28.3 26.0 - 34.0 pg   MCHC 35.5 30.0 - 36.0 g/dL   RDW 24.4 01.0 - 27.2 %   Platelets 80 (L) 150 - 400 K/uL    Comment: Immature Platelet Fraction may be clinically indicated, consider ordering this additional test [id number]    nRBC 0.1 0.0 - 0.2 %    Comment: Performed at [facility], [address] [place], [place] [address]     Assessment:     28 y.o. [id number] postoperativeday # 1. Continue post operative care  Regular diet  OOB in a chair today 1) Preeclampsia with severe features based upon severe blood pressures/elevated creatinine  Has had severe range blood pressures over night necessitating IV labetalol x 3 since 2330  last night. Continues to diures. Creatinine level is decreasing and now below 2             Magnesium level therapeutic             Will discontinue magnesium at 1300 today. 2)Acute blood loss anemia - after second unit of PRBCs hemoglobin now 8.8gm/dl  Continue iron and vitamins 3) Acute renal failure: creatinine decreasing and now 1.82 Urine output 125-400 ml/hr. Potassium now normal. Continue with NS. Consider discontinuing Toradol and holding Motrin. Recheck creatinine at 1700.  4) Stillbirth- patient holding baby intermittently and grieving appropriately. Family planning burial arrangements  5) Thrombocytopenia-platelets now 80K. Recheck at 1700  6) TDAP status - given 12/27/2018  7) DVT prophylaxis: SCDs on . Get OOB in chair after magnesium discontinued  8) A POS/ RI/ VI  Farrel Conners, CNM

## 2019-01-15 ENCOUNTER — Encounter: Payer: Self-pay | Admitting: Obstetrics and Gynecology

## 2019-01-15 LAB — BASIC METABOLIC PANEL
Anion gap: 7 (ref 5–15)
BUN: 21 mg/dL — ABNORMAL HIGH (ref 6–20)
CO2: 21 mmol/L — ABNORMAL LOW (ref 22–32)
Calcium: 7 mg/dL — ABNORMAL LOW (ref 8.9–10.3)
Chloride: 108 mmol/L (ref 98–111)
Creatinine, Ser: 1.54 mg/dL — ABNORMAL HIGH (ref 0.44–1.00)
GFR calc Af Amer: 53 mL/min — ABNORMAL LOW (ref >60)
GFR calc non Af Amer: 45 mL/min — ABNORMAL LOW (ref >60)
Glucose, Bld: 104 mg/dL — ABNORMAL HIGH (ref 70–99)
Potassium: 3.6 mmol/L (ref 3.5–5.1)
Sodium: 136 mmol/L (ref 135–145)

## 2019-01-15 LAB — ADAMTS13 ACTIVITY REFLEX

## 2019-01-15 LAB — CBC
HCT: 22.1 % — ABNORMAL LOW (ref 36.0–46.0)
Hemoglobin: 7.6 g/dL — ABNORMAL LOW (ref 12.0–15.0)
MCH: 28 pg (ref 26.0–34.0)
MCHC: 34.4 g/dL (ref 30.0–36.0)
MCV: 81.5 fL (ref 80.0–100.0)
Platelets: 83 10*3/uL — ABNORMAL LOW (ref 150–400)
RBC: 2.71 MIL/uL — ABNORMAL LOW (ref 3.87–5.11)
RDW: 15.9 % — ABNORMAL HIGH (ref 11.5–15.5)
WBC: 14.9 10*3/uL — ABNORMAL HIGH (ref 4.0–10.5)
nRBC: 0 % (ref 0.0–0.2)

## 2019-01-15 LAB — PROTEIN / CREATININE RATIO, URINE
Creatinine, Urine: 41 mg/dL
Protein Creatinine Ratio: 3.41 mg/mg{Cre} — ABNORMAL HIGH (ref 0.00–0.15)
Total Protein, Urine: 140 mg/dL

## 2019-01-15 LAB — SODIUM, URINE, RANDOM: Sodium, Ur: 51 mmol/L

## 2019-01-15 LAB — PARVOVIRUS B19 ANTIBODY, IGG AND IGM
Parovirus B19 IgG Abs: 0.4 index (ref 0.0–0.8)
Parovirus B19 IgM Abs: 0.3 index (ref 0.0–0.8)

## 2019-01-15 LAB — ADAMTS13 ACTIVITY: Adamts 13 Activity: 58.4 % — ABNORMAL LOW (ref >66.8)

## 2019-01-15 LAB — CREATININE, URINE, RANDOM: Creatinine, Urine: 40 mg/dL

## 2019-01-15 MED ORDER — OXYCODONE HCL 5 MG PO TABS
5.0000 mg | ORAL_TABLET | Freq: Once | ORAL | Status: AC | PRN
  Administered 2019-01-15: 5 mg via ORAL
  Filled 2019-01-15: qty 1

## 2019-01-15 MED ORDER — LABETALOL HCL 100 MG PO TABS
100.0000 mg | ORAL_TABLET | Freq: Once | ORAL | Status: AC
  Administered 2019-01-15: 100 mg via ORAL

## 2019-01-15 MED ORDER — AMMONIA AROMATIC IN INHA
RESPIRATORY_TRACT | Status: AC
  Filled 2019-01-15: qty 10

## 2019-01-15 MED ORDER — NIFEDIPINE ER OSMOTIC RELEASE 30 MG PO TB24
30.0000 mg | ORAL_TABLET | Freq: Every day | ORAL | Status: DC
  Administered 2019-01-15: 30 mg via ORAL
  Filled 2019-01-15 (×2): qty 1

## 2019-01-15 MED ORDER — MISOPROSTOL 200 MCG PO TABS
ORAL_TABLET | ORAL | Status: AC
  Filled 2019-01-15: qty 4

## 2019-01-15 MED ORDER — LABETALOL HCL 100 MG PO TABS
300.0000 mg | ORAL_TABLET | Freq: Three times a day (TID) | ORAL | Status: DC
  Administered 2019-01-15 (×3): 300 mg via ORAL
  Filled 2019-01-15 (×3): qty 3

## 2019-01-15 MED ORDER — NIFEDIPINE ER OSMOTIC RELEASE 30 MG PO TB24
60.0000 mg | ORAL_TABLET | Freq: Every day | ORAL | Status: DC
  Administered 2019-01-15: 30 mg via ORAL

## 2019-01-15 MED ORDER — LIDOCAINE HCL (PF) 1 % IJ SOLN
INTRAMUSCULAR | Status: AC
  Filled 2019-01-15: qty 30

## 2019-01-15 MED ORDER — OXYCODONE HCL 5 MG/5ML PO SOLN
5.0000 mg | Freq: Once | ORAL | Status: AC | PRN
  Filled 2019-01-15: qty 5

## 2019-01-15 MED ORDER — ACETAMINOPHEN 325 MG PO TABS
650.0000 mg | ORAL_TABLET | Freq: Four times a day (QID) | ORAL | Status: DC | PRN
  Administered 2019-01-15 – 2019-01-16 (×2): 650 mg via ORAL
  Filled 2019-01-15 (×3): qty 2

## 2019-01-15 MED ORDER — HYDRALAZINE HCL 25 MG PO TABS
25.0000 mg | ORAL_TABLET | Freq: Three times a day (TID) | ORAL | Status: DC
  Administered 2019-01-15 – 2019-01-16 (×5): 25 mg via ORAL
  Filled 2019-01-15 (×5): qty 1

## 2019-01-15 MED ORDER — OXYTOCIN 10 UNIT/ML IJ SOLN
INTRAMUSCULAR | Status: AC
  Filled 2019-01-15: qty 2

## 2019-01-15 MED ORDER — FENTANYL CITRATE (PF) 100 MCG/2ML IJ SOLN: 25.0000 ug | INTRAMUSCULAR | Status: DC | PRN

## 2019-01-15 MED ORDER — NIFEDIPINE ER OSMOTIC RELEASE 30 MG PO TB24: 30.0000 mg | ORAL_TABLET | Freq: Once | ORAL | Status: AC

## 2019-01-15 MED ORDER — OXYTOCIN 40 UNITS IN NORMAL SALINE INFUSION - SIMPLE MED
INTRAVENOUS | Status: AC
  Filled 2019-01-15: qty 1000

## 2019-01-15 MED ORDER — LABETALOL HCL 100 MG PO TABS
ORAL_TABLET | ORAL | Status: AC
  Administered 2019-01-15: 100 mg via ORAL
  Filled 2019-01-15: qty 1

## 2019-01-15 NOTE — Progress Notes (Signed)
Late entry progress note   S: Denies headaches, CP, SOB, lightheadedness  O: Severe range blood pressures last night at 2300 (163/98, 163/93) prior to her 200 mgm labetalol dose and she was given an additional 100 mgm labetalol. Blood pressures decreased after that until this 0700 this AM when again she again had blood pressures in severe range of 161/104, 173/102. She was given her 300 mgm labetalol dose ( changed labetalol to 300 mgm tid) and was also started on a second agent:  Procardia 30 mgm XL after consulting Dr Tiburcio Pea IV had also clotted off this AM and was restarted as her labs this AM still reflect an elevated creatinine of 1.54 although creatinine continues to decrease slightly. Her output over the last 24 hours >3000 ml. Hemoglobin this Am also decreased to 7.6gm/dl. She has had scant lochia Platelets now 83K.  Recent Labs    01/12/19 2315  01/14/19 0537 01/14/19 1707 01/15/19 0423 01/15/19 1131  WBC  --    < > 17.6* 16.3* 14.9*  --   HGB  --    < > 8.8* 8.6* 7.6*  --   HCT  --    < > 24.8* 24.6* 22.1*  --   PLT  --    < > 80* 81* 83*  --   CREATININE  --    < > 1.82* 1.59* 1.54*  --   PROTCRRATIO NOT CALCULATED  --   --   --   --     < > = values in this interval not displayed.   A/P: Blood pressures uncontrolled/ labile necessitating adding a second agent Acute renal failure: creatinine elevated but decreasing. Continue IV hydration Anemia/ thrombocytopenia: hemoglobin decreased to 7.6 and platelets slightly increased to 83K Discuss POM with oncoming staff and hospitalist  Farrel Conners, CNM

## 2019-01-15 NOTE — Anesthesia Postprocedure Evaluation (Cosign Needed)
Anesthesia Post Note  Patient: Celestia Khat  Procedure(s) Performed: CESAREAN SECTION (N/A Abdomen)  Patient location during evaluation: L&D Anesthesia Type: Epidural Level of consciousness: awake, awake and alert, oriented and patient cooperative Pain management: pain level controlled Vital Signs Assessment: post-procedure vital signs reviewed and stable Respiratory status: spontaneous breathing, nonlabored ventilation and respiratory function stable Cardiovascular status: stable Postop Assessment: no headache, no backache, patient able to bend at knees, no apparent nausea or vomiting, adequate PO intake and able to ambulate Anesthetic complications: no     Last Vitals:  Vitals:   01/15/19 0716 01/15/19 0741  BP: (!) 173/102 (!) 174/106  Pulse: 95 91  Resp:    Temp:    SpO2:      Last Pain:  Vitals:   01/15/19 0738  TempSrc:   PainSc: 6                  Zyonna Vardaman,  Kritika Stukes R

## 2019-01-15 NOTE — Progress Notes (Signed)
[facility] [place] [facility]     PATIENT NAME: Sierra Jordan    MR#:  [id number]  DATE OF BIRTH:  [birth year]  SUBJECTIVE:   No complaints other than pain near her surgical site from C-section.  BP remains labile but pt is asymptomatic.    REVIEW OF SYSTEMS:    Review of Systems  Constitutional: Negative for chills and fever.  HENT: Negative for congestion and tinnitus.   Eyes: Negative for blurred vision and double vision.  Respiratory: Negative for cough, shortness of breath and wheezing.   Cardiovascular: Negative for chest pain, orthopnea and PND.  Gastrointestinal: Negative for abdominal pain, diarrhea, nausea and vomiting.  Genitourinary: Negative for dysuria and hematuria.  Neurological: Negative for dizziness, sensory change and focal weakness.  All other systems reviewed and are negative.   Nutrition: Regular Tolerating Diet: Yes Tolerating PT: Ambulatory.   DRUG ALLERGIES:  No Known Allergies  VITALS:  Blood pressure 136/81, pulse (!) 103, temperature 98.3 F (36.8 C), temperature source Oral, resp. rate 16, height 5\' 3"  (1.6 m), weight 112.9 kg, last menstrual period 05/12/2018, SpO2 99 %, unknown if currently breastfeeding.  PHYSICAL EXAMINATION:   Physical Exam  GENERAL:  28 y.o.-year-old patient lying in bed in no acute distress.  EYES: Pupils equal, round, reactive to light and accommodation. No scleral icterus. Extraocular muscles intact.  HEENT: Head atraumatic, normocephalic. Oropharynx and nasopharynx clear.  NECK:  Supple, no jugular venous distention. No thyroid enlargement, no tenderness.  LUNGS: Normal breath sounds bilaterally, no wheezing, rales, rhonchi. No use of accessory muscles of respiration.  CARDIOVASCULAR: S1, S2 normal. No murmurs, rubs, or gallops.  ABDOMEN: Soft, nontender, nondistended. Bowel sounds present. No organomegaly or mass.  EXTREMITIES: No cyanosis, clubbing or edema b/l.    NEUROLOGIC: Cranial  nerves II through XII are intact. No focal Motor or sensory deficits b/l.   PSYCHIATRIC: The patient is alert and oriented x 3.  SKIN: No obvious rash, lesion, or ulcer.    LABORATORY PANEL:   CBC Recent Labs  Lab 01/15/19 0423  WBC 14.9*  HGB 7.6*  HCT 22.1*  PLT 83*   ------------------------------------------------------------------------------------------------------------------  Chemistries  Recent Labs  Lab 01/14/19 0537  01/15/19 0423  NA 133*  --  136  K 3.9  --  3.6  CL 106  --  108  CO2 21*  --  21*  GLUCOSE 93  --  104*  BUN 23*  --  21*  CREATININE 1.82*   < > 1.54*  CALCIUM 7.4*  --  7.0*  MG 4.9*  --   --   AST 56*  --   --   ALT 32  --   --   ALKPHOS 105  --   --   BILITOT 0.5  --   --    < > = values in this interval not displayed.   ------------------------------------------------------------------------------------------------------------------  Cardiac Enzymes No results for input(s): TROPONINI in the last 168 hours. ------------------------------------------------------------------------------------------------------------------  RADIOLOGY:  US Renal  Result Date: 01/13/2019 CLINICAL DATA:  28 year old female with acute renal failure. Patient underwent C-section this morning. EXAM: RENAL / URINARY TRACT ULTRASOUND COMPLETE COMPARISON:  None. FINDINGS: Right Kidney: Renal measurements: 14 x 6.5 x 6.5 cm = volume: 313 mL. Echogenicity is within normal limits. Mild RIGHT hydronephrosis noted. No RIGHT renal mass identified. Left Kidney: Renal measurements: 12.7 x 6.3 x 5.4 cm = volume: 228 mL. Echogenicity within normal limits. No mass or hydronephrosis visualized. Bladder: Not  identified due to C-section changes. IMPRESSION: 1. Mild RIGHT hydronephrosis, which may be secondary to recent pregnancy. 2. No other renal abnormalities identified. 3. Bladder not visualized. Electronically Signed   By: Harmon PierJeffrey  Hu M.D.   On: 01/13/2019 18:22   Dg Chest Port  1 View  Result Date: 01/13/2019 CLINICAL DATA:  Status post C-section, evaluate for pneumonia EXAM: PORTABLE CHEST 1 VIEW COMPARISON:  07/15/2018 FINDINGS: The heart size and mediastinal contours are within normal limits. Both lungs are clear. The visualized skeletal structures are unremarkable. IMPRESSION: No active disease. Electronically Signed   By: Elige KoHetal  Patel   On: 01/13/2019 15:24     ASSESSMENT AND PLAN:   28 year old female who was admitted to the hospital on the OB/GYN service secondary to placental abruption secondary to preeclampsia and postoperatively after C-section noted to have accelerated hypertension and acute kidney injury.  1.  Accelerated hypertension-secondary to underlying preeclampsia. -Blood pressures remain labile.  Continue labetalol, Procardia, will add some low-dose hydralazine. -Continue PRN IV labetalol hydralazine. -Target blood pressure should be systolics in the 140s to 150s with diastolics in the 90s for now. -Upon discharge patient likely needs to get herself a blood pressure monitor and follow-up with PCP.  2.  Acute kidney injury- secondary to underlying preeclampsia -Improving with IV fluid hydration.    Renal ultrasound negative for obstruction. -Patient did have proteinuria on her urinalysis previously.  Will refer to nephrology as an outpatient for further follow-up of her renal function but is trending down now.  She is urinating well.  3.  Placental abruption with stillbirth  -continue further care as per OB/GYN. -Continue pain control as per OB/GYN. - Clinically patient is improving.  4.  Anemia/Thrombocytopenia-secondary to the preeclampsia. - counts are stable and will cont. To monitor. No bleeding. No acute need for transfusion.   5.  Leukocytosis-secondary to the preeclampsia and stress mediated. -No acute source of infection.  Continue empiric antibiotics as per OB/GYN.    All the records are reviewed and case discussed with Care  Management/Social Worker. Management plans discussed with the patient, family and they are in agreement.  CODE STATUS: Full code  DVT Prophylaxis: Ambulatory/Ted's SCD's.   TOTAL TIME TAKING CARE OF THIS PATIENT: 30 minutes.   POSSIBLE D/C IN 1-2 DAYS, DEPENDING ON CLINICAL CONDITION.   Houston SirenVivek J Shaylen Nephew M.D on 01/15/2019 at 2:33 PM  Between 7am to 6pm - Pager - [phone]  After 6pm go to [email or web address] - Social research officer, governmentpassword EPAS [facility]  [facility] [place] [facility]  Office  [phone]  CC: Primary care physician; Patient, No Pcp Per

## 2019-01-15 NOTE — Progress Notes (Signed)
Mother ready for funeral home to pick up baby. AC notified and AC picked up baby at 1818 to take downstairs. Mother was given all personal items from the baby. AC to notify [facility] for pick up

## 2019-01-15 NOTE — Progress Notes (Signed)
Subjective:   Post Op Day 2 LTCS for abruption She reports feeling well. She is tolerating regular diet and her pain is well controlled with PO pain medication. She is asymptomatic while OOB and ambulating. She is voiding without difficulty. She is passing gas. She denies headache or visual changes or epigastric pain. Discussed possible discharge today with understanding of stabilizing BP prior to discharge.    Objective:  Blood pressure (!) 157/95, pulse 88, temperature 98.7 F (37.1 C), temperature source Oral, resp. rate 18, height 5\' 3"  (1.6 m), weight 112.9 kg, last menstrual period 05/12/2018, SpO2 99 %  General: NAD Pulmonary: no increased work of breathing Abdomen: non-distended, non-tender, fundus firm at level of umbilicus Incision: Pressure dressing is C/D/I, No On Q pump Extremities: no edema, no erythema, no tenderness  Results for orders placed or performed during the hospital encounter of 01/12/19 (from the past 24 hour(s))  Creatinine, serum     Status: Abnormal   Collection Time: 01/14/19  5:07 PM  Result Value Ref Range   Creatinine, Ser 1.59 (H) 0.44 - 1.00 mg/dL   GFR calc non Af Amer 44 (L) >60 mL/min   GFR calc Af Amer 51 (L) >60 mL/min  CBC     Status: Abnormal   Collection Time: 01/14/19  5:07 PM  Result Value Ref Range   WBC 16.3 (H) 4.0 - 10.5 K/uL   RBC 3.04 (L) 3.87 - 5.11 MIL/uL   Hemoglobin 8.6 (L) 12.0 - 15.0 g/dL   HCT 16.124.6 (L) 09.636.0 - 04.546.0 %   MCV 80.9 80.0 - 100.0 fL   MCH 28.3 26.0 - 34.0 pg   MCHC 35.0 30.0 - 36.0 g/dL   RDW 40.915.4 81.111.5 - 91.415.5 %   Platelets 81 (L) 150 - 400 K/uL   nRBC 0.0 0.0 - 0.2 %  CBC     Status: Abnormal   Collection Time: 01/15/19  4:23 AM  Result Value Ref Range   WBC 14.9 (H) 4.0 - 10.5 K/uL   RBC 2.71 (L) 3.87 - 5.11 MIL/uL   Hemoglobin 7.6 (L) 12.0 - 15.0 g/dL   HCT 78.222.1 (L) 95.636.0 - 21.346.0 %   MCV 81.5 80.0 - 100.0 fL   MCH 28.0 26.0 - 34.0 pg   MCHC 34.4 30.0 - 36.0 g/dL   RDW 08.615.9 (H) 57.811.5 - 46.915.5 %   Platelets 83 (L) 150 - 400 K/uL   nRBC 0.0 0.0 - 0.2 %  Basic metabolic panel     Status: Abnormal   Collection Time: 01/15/19  4:23 AM  Result Value Ref Range   Sodium 136 135 - 145 mmol/L   Potassium 3.6 3.5 - 5.1 mmol/L   Chloride 108 98 - 111 mmol/L   CO2 21 (L) 22 - 32 mmol/L   Glucose, Bld 104 (H) 70 - 99 mg/dL   BUN 21 (H) 6 - 20 mg/dL   Creatinine, Ser 6.291.54 (H) 0.44 - 1.00 mg/dL   Calcium 7.0 (L) 8.9 - 10.3 mg/dL   GFR calc non Af Amer 45 (L) >60 mL/min   GFR calc Af Amer 53 (L) >60 mL/min   Anion gap 7 5 - 15    Intake/Output Summary (Last 24 hours) at 01/15/2019 1056 Last data filed at 01/15/2019 1000 Gross per 24 hour  Intake 2261.79 ml  Output 2980 ml  Net -718.21 ml      Assessment:   27 y.o. [id number] postoperativeday # 2   Plan:  1) Acute blood loss anemia -  hemodynamically stable and asymptomatic - po ferrous sulfate  2) Continue post operative care  3) Preeclampsia with severe features: currently on Labetalol PO 300 mg TID and Procardia 60 XL  4) Thrombocytopenia: platelets increasing now 83K  5) Acute renal failure: Creatinine continues to decrease, continue NS until DC  6) Stillbirth- family planning burial arrangements  7) A positive, Rubella Immune, Varicella Immune  8) TDAP given antepartum  9) Contraception: undecided and may consider Mirena  10) Disposition: possible DC today pending hospitalist evaluation   Parke Poisson, CNM [facility] 01/15/2019, 11:20 AM

## 2019-01-15 NOTE — Progress Notes (Signed)
Subjective:  Doing well, minimal lochia.  Pain well controlled on po analgesics.    Objective:  Vital signs in last 24 hours: Temp:  [98.6 F (37 C)-99.4 F (37.4 C)] 98.7 F (37.1 C) (04/20 0403) Pulse Rate:  [87-101] 91 (04/20 1210) Resp:  [15-18] 18 (04/20 0403) BP: (126-174)/(76-108) 160/101 (04/20 1210) SpO2:  [98 %-99 %] 99 % (04/19 1338)    Intake/Output      04/19 0701 - 04/20 0700 04/20 0701 - 04/21 0700   P.O.  120   I.V. (mL/kg) 3224.5 (28.6) 353.6 (3.1)   Blood     IV Piggyback 395    Total Intake(mL/kg) 3619.6 (32.1) 473.6 (4.2)   Urine (mL/kg/hr) 3555 (1.3) 800 (1.3)   Blood     Total Output 3555 800   Net +64.6 -326.4         I/O last 3 completed shifts: In: 4994.6 [P.O.:1375; I.V.:3224.5; IV Piggyback:395] Out: 7369 [Urine:7369]  General: NAD Pulmonary: no increased work of breathing Abdomen: non-distended, non-tender, fundus firm at level of umbilicus Incision: D/C/I pressure dressing   Results for orders placed or performed during the hospital encounter of 01/12/19 (from the past 72 hour(s))  Urine Drug Screen, Qualitative ([facility] only)     Status: Abnormal   Collection Time: 01/12/19 11:15 PM  Result Value Ref Range   Tricyclic, Ur Screen NONE DETECTED NONE DETECTED   Amphetamines, Ur Screen NONE DETECTED NONE DETECTED   MDMA (Ecstasy)Ur Screen NONE DETECTED NONE DETECTED   Cocaine Metabolite,Ur [place] NONE DETECTED NONE DETECTED   Opiate, Ur Screen NONE DETECTED NONE DETECTED   Phencyclidine (PCP) Ur S NONE DETECTED NONE DETECTED   Cannabinoid 50 Ng, Ur [place] POSITIVE (A) NONE DETECTED   Barbiturates, Ur Screen NONE DETECTED NONE DETECTED   Benzodiazepine, Ur Scrn NONE DETECTED NONE DETECTED   Methadone Scn, Ur NONE DETECTED NONE DETECTED    Comment: (NOTE) Tricyclics + metabolites, urine    Cutoff 1000 ng/mL Amphetamines + metabolites, urine  Cutoff 1000 ng/mL MDMA (Ecstasy), urine              Cutoff 500 ng/mL Cocaine Metabolite, urine           Cutoff 300 ng/mL Opiate + metabolites, urine        Cutoff 300 ng/mL Phencyclidine (PCP), urine         Cutoff 25 ng/mL Cannabinoid, urine                 Cutoff 50 ng/mL Barbiturates + metabolites, urine  Cutoff 200 ng/mL Benzodiazepine, urine              Cutoff 200 ng/mL Methadone, urine                   Cutoff 300 ng/mL The urine drug screen provides only a preliminary, unconfirmed analytical test result and should not be used for non-medical purposes. Clinical consideration and professional judgment should be applied to any positive drug screen result due to possible interfering substances. A more specific alternate chemical method must be used in order to obtain a confirmed analytical result. Gas chromatography / mass spectrometry (GC/MS) is the preferred confirmat ory method. Performed at [facility], [address] [place], [place] [address]   Protein / creatinine ratio, urine     Status: None   Collection Time: 01/12/19 11:15 PM  Result Value Ref Range   Creatinine, Urine 462 mg/dL    Comment: RESULTS CONFIRMED BY MANUAL DILUTION  Total Protein, Urine >3,000 mg/dL    Comment: RESULTS CONFIRMED BY MANUAL DILUTION   Protein Creatinine Ratio NOT CALCULATED 0.00 - 0.15 mg/mg[Cre]    Comment: Performed at [facility], [address] [place], [place] [address]  CBC     Status: Abnormal   Collection Time: 01/12/19 11:25 PM  Result Value Ref Range   WBC 12.2 (H) 4.0 - 10.5 K/uL   RBC 3.98 3.87 - 5.11 MIL/uL   Hemoglobin 10.5 (L) 12.0 - 15.0 g/dL   HCT 62.1 (L) 30.8 - 65.7 %   MCV 77.4 (L) 80.0 - 100.0 fL   MCH 26.4 26.0 - 34.0 pg   MCHC 34.1 30.0 - 36.0 g/dL   RDW 84.6 96.2 - 95.2 %   Platelets 237 150 - 400 K/uL   nRBC 0.2 0.0 - 0.2 %    Comment: Performed at [facility], [address] [place], [place] [address]  RPR     Status: None   Collection Time: 01/12/19 11:25 PM  Result Value Ref Range   RPR Ser Ql Non Reactive Non Reactive     Comment: (NOTE) Performed At: [facility] [address] [place], [place] [id number] Jolene Schimke MD [id number]   Lupus anticoagulant panel     Status: None   Collection Time: 01/12/19 11:25 PM  Result Value Ref Range   PTT Lupus Anticoagulant 31.5 0.0 - 51.9 sec   DRVVT 31.5 0.0 - 47.0 sec   Lupus Anticoag Interp Comment:     Comment: (NOTE) No lupus anticoagulant was detected. Performed At: [facility] [address] [place], [place] [id number] Jolene Schimke MD [id number]   Cardiolipin antibodies, IgM+IgG     Status: None   Collection Time: 01/12/19 11:25 PM  Result Value Ref Range   Anticardiolipin IgG <9 0 - 14 GPL U/mL    Comment: (NOTE)                          Negative:              <15                          Indeterminate:     15 - 20                          Low-Med Positive: >20 - 80                          High Positive:         >80    Anticardiolipin IgM <9 0 - 12 MPL U/mL    Comment: (NOTE)                          Negative:              <13                          Indeterminate:     13 - 20                          Low-Med Positive: >20 - 80  High Positive:         >80 Performed At: [facility] [address] [place], [place] [id number] Jolene Schimke MD [id number]   Kleihauer-Betke stain     Status: None   Collection Time: 01/12/19 11:25 PM  Result Value Ref Range   Fetal Cells % 0 %   Quantitation Fetal Hemoglobin 0.0000 mL   # Vials RhIg NOT INDICATED     Comment: Performed at [facility], [address] [place], [place] [address]  Beta-2-glycoprotein i abs, IgG/M/A     Status: None   Collection Time: 01/12/19 11:25 PM  Result Value Ref Range   Beta-2 Glyco I IgG <9 0 - 20 GPI IgG units    Comment: (NOTE) The reference interval reflects a 3SD or 99th percentile interval, which is thought to represent a potentially clinically significant result in accordance with the  International Consensus Statement on the classification criteria for definitive antiphospholipid syndrome (APS). J Thromb Haem 2006;4:295-306.    Beta-2-Glycoprotein I IgM <9 0 - 32 GPI IgM units    Comment: (NOTE) The reference interval reflects a 3SD or 99th percentile interval, which is thought to represent a potentially clinically significant result in accordance with the International Consensus Statement on the classification criteria for definitive antiphospholipid syndrome (APS). J Thromb Haem 2006;4:295-306. Performed At: [facility] [address] [place], [place] [id number] Jolene Schimke MD [id number]    Beta-2-Glycoprotein I IgA <9 0 - 25 GPI IgA units    Comment: (NOTE) The reference interval reflects a 3SD or 99th percentile interval, which is thought to represent a potentially clinically significant result in accordance with the International Consensus Statement on the classification criteria for definitive antiphospholipid syndrome (APS). J Thromb Haem 2006;4:295-306.   Comprehensive metabolic panel     Status: Abnormal   Collection Time: 01/12/19 11:25 PM  Result Value Ref Range   Sodium 135 135 - 145 mmol/L   Potassium 3.6 3.5 - 5.1 mmol/L   Chloride 107 98 - 111 mmol/L   CO2 17 (L) 22 - 32 mmol/L   Glucose, Bld 138 (H) 70 - 99 mg/dL   BUN 20 6 - 20 mg/dL   Creatinine, Ser 4.01 (H) 0.44 - 1.00 mg/dL   Calcium 7.9 (L) 8.9 - 10.3 mg/dL   Total Protein 5.8 (L) 6.5 - 8.1 g/dL   Albumin 2.4 (L) 3.5 - 5.0 g/dL   AST 45 (H) 15 - 41 U/L   ALT 31 0 - 44 U/L   Alkaline Phosphatase 160 (H) 38 - 126 U/L   Total Bilirubin 0.6 0.3 - 1.2 mg/dL   GFR calc non Af Amer >60 >60 mL/min   GFR calc Af Amer >60 >60 mL/min   Anion gap 11 5 - 15    Comment: Performed at [facility], [address] [place], [place] [address]  Type and screen [facility]     Status: None   Collection Time: 01/12/19 11:32 PM  Result Value Ref Range    ABO/RH(D) A POS    Antibody Screen NEG    Sample Expiration 01/15/2019    Unit Number [id number]    Blood Component Type RBC, LR IRR    Unit division 00    Status of Unit REL FROM [facility]    Transfusion Status OK TO TRANSFUSE    Crossmatch Result Compatible    Unit Number [id number]    Blood Component Type RBC, LR IRR    Unit division 00  Status of Unit ISSUED,FINAL    Transfusion Status OK TO TRANSFUSE    Crossmatch Result      Compatible Performed at [facility], [address] [place], [place] [address]    Unit Number [id number]    Blood Component Type RED CELLS,LR    Unit division 00    Status of Unit ISSUED,FINAL    Transfusion Status OK TO TRANSFUSE    Crossmatch Result Compatible    Unit Number [id number]    Blood Component Type RED CELLS,LR    Unit division 00    Status of Unit REL FROM [facility]    Transfusion Status OK TO TRANSFUSE    Crossmatch Result Compatible    Unit Number [id number]    Blood Component Type RED CELLS,LR    Unit division 00    Status of Unit REL FROM [facility]    Transfusion Status OK TO TRANSFUSE    Crossmatch Result Compatible   Prepare RBC     Status: None   Collection Time: 01/13/19 12:34 AM  Result Value Ref Range   Order Confirmation      ORDER PROCESSED BY BLOOD BANK Performed at [facility], [address] [place], [place] [address]   CBC     Status: Abnormal   Collection Time: 01/13/19  6:39 AM  Result Value Ref Range   WBC 22.5 (H) 4.0 - 10.5 K/uL   RBC 2.93 (L) 3.87 - 5.11 MIL/uL   Hemoglobin 8.0 (L) 12.0 - 15.0 g/dL   HCT 10.2 (L) 72.5 - 36.6 %   MCV 80.5 80.0 - 100.0 fL   MCH 27.3 26.0 - 34.0 pg   MCHC 33.9 30.0 - 36.0 g/dL   RDW 44.0 34.7 - 42.5 %   Platelets 123 (L) 150 - 400 K/uL    Comment: Immature Platelet Fraction may be clinically indicated, consider ordering this additional test [id number]    nRBC 0.1 0.0 - 0.2 %    Comment: Performed at [facility], [address] [place], [place] [address]  Comprehensive metabolic panel     Status: Abnormal   Collection Time: 01/13/19  6:39 AM  Result Value Ref Range   Sodium 136 135 - 145 mmol/L   Potassium 5.2 (H) 3.5 - 5.1 mmol/L   Chloride 107 98 - 111 mmol/L   CO2 19 (L) 22 - 32 mmol/L   Glucose, Bld 169 (H) 70 - 99 mg/dL   BUN 23 (H) 6 - 20 mg/dL   Creatinine, Ser 5.18 (H) 0.44 - 1.00 mg/dL   Calcium 7.7 (L) 8.9 - 10.3 mg/dL   Total Protein 5.4 (L) 6.5 - 8.1 g/dL   Albumin 2.1 (L) 3.5 - 5.0 g/dL   AST 66 (H) 15 - 41 U/L   ALT 30 0 - 44 U/L   Alkaline Phosphatase 125 38 - 126 U/L   Total Bilirubin 1.4 (H) 0.3 - 1.2 mg/dL   GFR calc non Af Amer 35 (L) >60 mL/min   GFR calc Af Amer 40 (L) >60 mL/min   Anion gap 10 5 - 15    Comment: Performed at [facility], [address] [place], [place] [address]  Protime-INR     Status: None   Collection Time: 01/13/19  6:39 AM  Result Value Ref Range   Prothrombin Time 14.6 11.4 - 15.2 seconds   INR 1.2 0.8 - 1.2    Comment: (NOTE) INR goal varies based on device and disease states. Performed at  [facility], [address] [place], [place] [address]   APTT     Status: None   Collection Time: 01/13/19  6:39 AM  Result Value Ref Range   aPTT 34 24 - 36 seconds    Comment: Performed at [facility], [address] [place], [place] [address]  Fibrinogen     Status: Abnormal   Collection Time: 01/13/19  6:39 AM  Result Value Ref Range   Fibrinogen 200 (L) 210 - 475 mg/dL    Comment: Performed at [facility], [address] [place], [place] [address]  Lactate dehydrogenase     Status: Abnormal   Collection Time: 01/13/19  6:45 AM  Result Value Ref Range   LDH 687 (H) 98 - 192 U/L    Comment: Performed at [facility], [address] [place], [place] [address]  CBC     Status: Abnormal   Collection Time: 01/13/19 10:08 AM  Result Value Ref Range   WBC 25.4 (H) 4.0 - 10.5 K/uL   RBC 3.57 (L) 3.87  - 5.11 MIL/uL   Hemoglobin 9.9 (L) 12.0 - 15.0 g/dL   HCT 79.3 (L) 90.3 - 00.9 %   MCV 82.1 80.0 - 100.0 fL   MCH 27.7 26.0 - 34.0 pg   MCHC 33.8 30.0 - 36.0 g/dL   RDW 23.3 00.7 - 62.2 %   Platelets 114 (L) 150 - 400 K/uL    Comment: Immature Platelet Fraction may be clinically indicated, consider ordering this additional test [id number]    nRBC 0.1 0.0 - 0.2 %    Comment: Performed at [facility], [address] [place], [place] [address]  CBC     Status: Abnormal   Collection Time: 01/13/19 12:39 PM  Result Value Ref Range   WBC 20.3 (H) 4.0 - 10.5 K/uL   RBC 3.02 (L) 3.87 - 5.11 MIL/uL   Hemoglobin 8.5 (L) 12.0 - 15.0 g/dL   HCT 93.7 (L) 34.2 - 87.6 %   MCV 79.5 (L) 80.0 - 100.0 fL   MCH 28.1 26.0 - 34.0 pg   MCHC 35.4 30.0 - 36.0 g/dL   RDW 81.1 57.2 - 62.0 %   Platelets 102 (L) 150 - 400 K/uL    Comment: Immature Platelet Fraction may be clinically indicated, consider ordering this additional test [id number]    nRBC 0.1 0.0 - 0.2 %    Comment: Performed at [facility], [address] [place], [place] [address]  Comprehensive metabolic panel     Status: Abnormal   Collection Time: 01/13/19 12:39 PM  Result Value Ref Range   Sodium 136 135 - 145 mmol/L   Potassium 5.3 (H) 3.5 - 5.1 mmol/L   Chloride 108 98 - 111 mmol/L   CO2 20 (L) 22 - 32 mmol/L   Glucose, Bld 139 (H) 70 - 99 mg/dL   BUN 27 (H) 6 - 20 mg/dL   Creatinine, Ser 6.46 (H) 0.44 - 1.00 mg/dL   Calcium 7.2 (L) 8.9 - 10.3 mg/dL   Total Protein 4.7 (L) 6.5 - 8.1 g/dL   Albumin 1.8 (L) 3.5 - 5.0 g/dL   AST 65 (H) 15 - 41 U/L   ALT 31 0 - 44 U/L   Alkaline Phosphatase 101 38 - 126 U/L   Total Bilirubin 0.6 0.3 - 1.2 mg/dL   GFR calc non Af Amer 30 (L) >60 mL/min   GFR calc Af Amer 35 (L) >60 mL/min  Anion gap 8 5 - 15    Comment: Performed at [facility], [address] [place], [place] [address]  Prepare RBC     Status: None   Collection Time: 01/13/19  2:55 PM   Result Value Ref Range   Order Confirmation      ORDER PROCESSED BY BLOOD BANK Performed at [facility], [address] [place], [place] [address]   Magnesium     Status: Abnormal   Collection Time: 01/13/19  5:07 PM  Result Value Ref Range   Magnesium 3.7 (H) 1.7 - 2.4 mg/dL    Comment: Performed at [facility], [address] [place], [place] [address]  Lactate dehydrogenase     Status: Abnormal   Collection Time: 01/13/19  5:07 PM  Result Value Ref Range   LDH 640 (H) 98 - 192 U/L    Comment: Performed at [facility], [address] [place], [place] [address]  Magnesium     Status: Abnormal   Collection Time: 01/13/19 11:49 PM  Result Value Ref Range   Magnesium 4.5 (H) 1.7 - 2.4 mg/dL    Comment: Performed at [facility], [address] [place], [place] [address]  Basic metabolic panel     Status: Abnormal   Collection Time: 01/13/19 11:49 PM  Result Value Ref Range   Sodium 132 (L) 135 - 145 mmol/L   Potassium 3.6 3.5 - 5.1 mmol/L   Chloride 103 98 - 111 mmol/L   CO2 21 (L) 22 - 32 mmol/L   Glucose, Bld 116 (H) 70 - 99 mg/dL   BUN 24 (H) 6 - 20 mg/dL   Creatinine, Ser 1.30 (H) 0.44 - 1.00 mg/dL   Calcium 7.3 (L) 8.9 - 10.3 mg/dL   GFR calc non Af Amer 35 (L) >60 mL/min   GFR calc Af Amer 40 (L) >60 mL/min   Anion gap 8 5 - 15    Comment: Performed at [facility], [address] [place], [place] [address]  Magnesium     Status: Abnormal   Collection Time: 01/14/19  5:37 AM  Result Value Ref Range   Magnesium 4.9 (H) 1.7 - 2.4 mg/dL    Comment: Performed at [facility], [address] [place], [place] [address]  Comprehensive metabolic panel     Status: Abnormal   Collection Time: 01/14/19  5:37 AM  Result Value Ref Range   Sodium 133 (L) 135 - 145 mmol/L   Potassium 3.9 3.5 - 5.1 mmol/L   Chloride 106 98 - 111 mmol/L   CO2 21 (L) 22 - 32 mmol/L   Glucose, Bld 93 70 - 99 mg/dL   BUN 23 (H) 6 -  20 mg/dL   Creatinine, Ser 9.52 (H) 0.44 - 1.00 mg/dL   Calcium 7.4 (L) 8.9 - 10.3 mg/dL   Total Protein 4.9 (L) 6.5 - 8.1 g/dL   Albumin 1.8 (L) 3.5 - 5.0 g/dL   AST 56 (H) 15 - 41 U/L   ALT 32 0 - 44 U/L   Alkaline Phosphatase 105 38 - 126 U/L   Total Bilirubin 0.5 0.3 - 1.2 mg/dL   GFR calc non Af Amer 37 (L) >60 mL/min   GFR calc Af Amer 43 (L) >60 mL/min   Anion gap 6 5 - 15    Comment: Performed at [facility], [address], [place], [place] [address]  CBC     Status: Abnormal   Collection  Time: 01/14/19  5:37 AM  Result Value Ref Range   WBC 17.6 (H) 4.0 - 10.5 K/uL   RBC 3.11 (L) 3.87 - 5.11 MIL/uL   Hemoglobin 8.8 (L) 12.0 - 15.0 g/dL   HCT 16.124.8 (L) 09.636.0 - 04.546.0 %   MCV 79.7 (L) 80.0 - 100.0 fL   MCH 28.3 26.0 - 34.0 pg   MCHC 35.5 30.0 - 36.0 g/dL   RDW 40.914.9 81.111.5 - 91.415.5 %   Platelets 80 (L) 150 - 400 K/uL    Comment: Immature Platelet Fraction may be clinically indicated, consider ordering this additional test [id number]    nRBC 0.1 0.0 - 0.2 %    Comment: Performed at [facility], [address] [place], [place] [address]  Creatinine, serum     Status: Abnormal   Collection Time: 01/14/19  5:07 PM  Result Value Ref Range   Creatinine, Ser 1.59 (H) 0.44 - 1.00 mg/dL   GFR calc non Af Amer 44 (L) >60 mL/min   GFR calc Af Amer 51 (L) >60 mL/min    Comment: Performed at [facility], [address] [place], [place] [address]  CBC     Status: Abnormal   Collection Time: 01/14/19  5:07 PM  Result Value Ref Range   WBC 16.3 (H) 4.0 - 10.5 K/uL   RBC 3.04 (L) 3.87 - 5.11 MIL/uL   Hemoglobin 8.6 (L) 12.0 - 15.0 g/dL   HCT 62.924.6 (L) 52.836.0 - 41.346.0 %   MCV 80.9 80.0 - 100.0 fL   MCH 28.3 26.0 - 34.0 pg   MCHC 35.0 30.0 - 36.0 g/dL   RDW 24.415.4 01.011.5 - 27.215.5 %   Platelets 81 (L) 150 - 400 K/uL    Comment: Immature Platelet Fraction may be clinically indicated, consider ordering this additional test [id number]    nRBC 0.0 0.0 - 0.2 %     Comment: Performed at [facility], [address] [place], [place] [address]  CBC     Status: Abnormal   Collection Time: 01/15/19  4:23 AM  Result Value Ref Range   WBC 14.9 (H) 4.0 - 10.5 K/uL   RBC 2.71 (L) 3.87 - 5.11 MIL/uL   Hemoglobin 7.6 (L) 12.0 - 15.0 g/dL   HCT 95.622.1 (L) 38.736.0 - 56.446.0 %   MCV 81.5 80.0 - 100.0 fL   MCH 28.0 26.0 - 34.0 pg   MCHC 34.4 30.0 - 36.0 g/dL   RDW 33.215.9 (H) 95.111.5 - 88.415.5 %   Platelets 83 (L) 150 - 400 K/uL    Comment: Immature Platelet Fraction may be clinically indicated, consider ordering this additional test [id number]    nRBC 0.0 0.0 - 0.2 %    Comment: Performed at [facility], [address] [place], [place] [address]  Basic metabolic panel     Status: Abnormal   Collection Time: 01/15/19  4:23 AM  Result Value Ref Range   Sodium 136 135 - 145 mmol/L   Potassium 3.6 3.5 - 5.1 mmol/L   Chloride 108 98 - 111 mmol/L   CO2 21 (L) 22 - 32 mmol/L   Glucose, Bld 104 (H) 70 - 99 mg/dL   BUN 21 (H) 6 - 20 mg/dL   Creatinine, Ser 3.231.54 (H) 0.44 - 1.00 mg/dL   Calcium 7.0 (L) 8.9 - 10.3 mg/dL   GFR calc non Af Amer 45 (L) >60 mL/min   GFR calc Af Amer 53 (L) >60 mL/min   Anion gap  7 5 - 15    Comment: Performed at [facility], [address] [place], [place] [address]  Sodium, urine, random     Status: None   Collection Time: 01/15/19 11:31 AM  Result Value Ref Range   Sodium, Ur 51 mmol/L    Comment: Performed at [facility], [address] [place], [place] [address]  Creatinine, urine, random     Status: None   Collection Time: 01/15/19 11:31 AM  Result Value Ref Range   Creatinine, Urine 40 mg/dL    Comment: Performed at [facility], [address] [place], [place] [address]    Immunization History  Administered Date(s) Administered  . Tdap 12/27/2018    Assessment:   28 y.o. [id number] postoperativeday # 2 LTCS abruption, IUFD, and preeclampsia with severe features (BP, Cr)   Plan:  ) Acute blood loss anemia - hemodynamically stable and asymptomatic - po ferrous sulfate  2) Blood Type --/--/A POS (04/17 2332) / Rubella 7.58 (01/16 1556) / Varicella Immune  3) TDAP status up to date  4) Feeding plan N/A  5) Preeclampsia with severe features - Procardia XL 60mg  daily - Hydralazine 25mg  po every 8-hrs - Labetalol 300mg  TID - Cr trending down - Diuresing spontanously  6) Disposition - pending consistent BP <160 systolic, <110 diastolic  Vena Austria, MD, [facility], [facility] 01/15/2019, 12:19 PM

## 2019-01-15 NOTE — Progress Notes (Signed)
   01/15/19 1100  Clinical Encounter Type  Visited With Patient  Visit Type Follow-up  Referral From Chaplain  Consult/Referral To Chaplain  Spiritual Encounters  Spiritual Needs Emotional;Grief support  Stress Factors  Patient Stress Factors Loss  Chaplain was rounding and visit patient. Patient had deceased baby ([place]) in her arms. Chaplain gave condolence for loss and offered encouraging words and allowed the patient to express her emotions. Patient could not understand why she wanting to hold the baby while she is dead. Chaplain explain the bonding and if she would like to have a release prayer, to have Chaplain called.. Patient was appreciative of that suggestion. Chaplain prayed with patient.

## 2019-01-16 LAB — COMPREHENSIVE METABOLIC PANEL
ALT: 23 U/L (ref 0–44)
AST: 28 U/L (ref 15–41)
Albumin: 2 g/dL — ABNORMAL LOW (ref 3.5–5.0)
Alkaline Phosphatase: 85 U/L (ref 38–126)
Anion gap: 8 (ref 5–15)
BUN: 14 mg/dL (ref 6–20)
CO2: 22 mmol/L (ref 22–32)
Calcium: 7.9 mg/dL — ABNORMAL LOW (ref 8.9–10.3)
Chloride: 107 mmol/L (ref 98–111)
Creatinine, Ser: 1.13 mg/dL — ABNORMAL HIGH (ref 0.44–1.00)
GFR calc Af Amer: >60 mL/min (ref >60)
GFR calc non Af Amer: >60 mL/min (ref >60)
Glucose, Bld: 78 mg/dL (ref 70–99)
Potassium: 3.6 mmol/L (ref 3.5–5.1)
Sodium: 137 mmol/L (ref 135–145)
Total Bilirubin: 0.7 mg/dL (ref 0.3–1.2)
Total Protein: 5.3 g/dL — ABNORMAL LOW (ref 6.5–8.1)

## 2019-01-16 LAB — CBC
HCT: 24 % — ABNORMAL LOW (ref 36.0–46.0)
Hemoglobin: 8.1 g/dL — ABNORMAL LOW (ref 12.0–15.0)
MCH: 27.8 pg (ref 26.0–34.0)
MCHC: 33.8 g/dL (ref 30.0–36.0)
MCV: 82.5 fL (ref 80.0–100.0)
Platelets: 114 10*3/uL — ABNORMAL LOW (ref 150–400)
RBC: 2.91 MIL/uL — ABNORMAL LOW (ref 3.87–5.11)
RDW: 16.3 % — ABNORMAL HIGH (ref 11.5–15.5)
WBC: 10.8 10*3/uL — ABNORMAL HIGH (ref 4.0–10.5)
nRBC: 0 % (ref 0.0–0.2)

## 2019-01-16 LAB — SURGICAL PATHOLOGY

## 2019-01-16 MED ORDER — ALPRAZOLAM 0.5 MG PO TABS: 0.5000 mg | ORAL_TABLET | Freq: Two times a day (BID) | ORAL | 0 refills | Status: DC | PRN

## 2019-01-16 MED ORDER — ALPRAZOLAM 0.5 MG PO TABS
0.5000 mg | ORAL_TABLET | Freq: Two times a day (BID) | ORAL | Status: DC | PRN
  Administered 2019-01-16: 0.5 mg via ORAL
  Filled 2019-01-16: qty 1

## 2019-01-16 MED ORDER — OXYCODONE-ACETAMINOPHEN 5-325 MG PO TABS: 1.0000 | ORAL_TABLET | Freq: Four times a day (QID) | ORAL | 0 refills | Status: DC | PRN

## 2019-01-16 MED ORDER — NIFEDIPINE ER OSMOTIC RELEASE 90 MG PO TB24: 90.0000 mg | ORAL_TABLET | Freq: Every day | ORAL | 1 refills | Status: DC

## 2019-01-16 MED ORDER — HYDRALAZINE HCL 25 MG PO TABS: 25.0000 mg | ORAL_TABLET | Freq: Three times a day (TID) | ORAL | 1 refills | Status: DC

## 2019-01-16 MED ORDER — OXYCODONE-ACETAMINOPHEN 5-325 MG PO TABS
1.0000 | ORAL_TABLET | ORAL | Status: DC | PRN
  Administered 2019-01-16: 1 via ORAL

## 2019-01-16 MED ORDER — NIFEDIPINE ER OSMOTIC RELEASE 30 MG PO TB24
90.0000 mg | ORAL_TABLET | Freq: Every day | ORAL | Status: DC
  Administered 2019-01-16: 90 mg via ORAL
  Filled 2019-01-16: qty 3

## 2019-01-16 MED ORDER — OXYCODONE-ACETAMINOPHEN 5-325 MG PO TABS
ORAL_TABLET | ORAL | Status: AC
  Filled 2019-01-16: qty 1

## 2019-01-16 MED ORDER — ACETAMINOPHEN 325 MG PO TABS: 650.0000 mg | ORAL_TABLET | Freq: Four times a day (QID) | ORAL | 3 refills | Status: DC | PRN

## 2019-01-16 MED ORDER — FERROUS SULFATE 325 (65 FE) MG PO TABS: 325.0000 mg | ORAL_TABLET | Freq: Two times a day (BID) | ORAL | 11 refills | Status: DC

## 2019-01-16 MED ORDER — LABETALOL HCL 300 MG PO TABS: 600.0000 mg | ORAL_TABLET | Freq: Three times a day (TID) | ORAL | 1 refills | Status: DC

## 2019-01-16 MED ORDER — LABETALOL HCL 200 MG PO TABS
600.0000 mg | ORAL_TABLET | Freq: Three times a day (TID) | ORAL | Status: DC
  Administered 2019-01-16 (×3): 600 mg via ORAL
  Filled 2019-01-16 (×2): qty 3
  Filled 2019-01-16: qty 6

## 2019-01-16 NOTE — Discharge Instructions (Signed)
[facility] [employer] conservation [email or web address]  [facility]  MJ Tucci [phone]   Please accept our heartfelt condolences and call us if you need an ear to listen.   Books: Miscarriage: Women Sharing from the Heart by Lorre Munroe and Ellyn Hack. Published by [employer]. Summarizes reactions of 100 women who experience miscarriage.  Mollys [place] by Baldemar Lenis. Published by Darlin [employer]. Childrens book on miscarriage.  Miscarriage: A Mans Book by [employer]. Published by [employer] at [phone]. Written to help men understand reactions of both parents to miscarriage.  Miscarriage: A Shattered Dream by Peri Maris and Lubertha Sayres.  I Never Held You: A book about miscarriage, healing and recovery by Armen Pickup. [place].   MISCARRIAGE OR NEONATAL LOSSa word to parents By Carmie End, MSW  For those who experience a miscarriage or fetal loss, the world has turned upside down. Regardless of whether the pregnancy was planned and wanted, or unplanned and the parents were ambivalent, there are feelings of sadness, grief and a loss of equilibrium which effects both parents. Though we allow for these feelings when other deaths occur, we are remiss in acknowledging and allowing parents to grieve their pregnancy loss or miscarriage. We wrongly equate the depth of the loss to the size of the casket.  With pregnancy loss or death before birth, there is little guidance or expectation of what is normal grief. Others, who minimize or misunderstand the loss, may expect that you get back to normal quickly. With pressures to overlook the impact of the loss, healthy grieving processes can get stunted, overlooked, or stopped all together. I hope that this handout will allow you to acknowledge and express whatever loss you feel, to share with others your sorrow and will encourage you and others to take a moment to understand and experience the depth  of your emotions at this time.  A baby represents so much. It may represent hope, immortality, fulfillment of dreams, or an outward sign of a loving relationship. At the same time, pregnancy may bring on anxiety, fear, and an increase in commitment and responsibility. Understandably, few pregnant parents experience only one or the other of these emotions. Despite the type of emotional response to the pregnancy, as the process continues, what is central to almost all pregnancies is that over time, the bond between parent and child grows. Each day brings an expansion of the world. What was once routine now becomes more important. Diet, lifestyle, connection to family, time commitments, relationships with friends, spouse, othersnothing is as it was. These are the normal changes that a pregnant woman and her partner experience. When a baby dies, nothing that made sense before makes any sense after. We are left emotionally raw. Often our bodies respond in ways that assure [place] that we are crazy, but we are not. We are grieving a life that we never fully knew, a relationship that was too short. A part of [place] has died Too.  Mothers and fathers become more attached to a pregnancy as each day, week and month go by. Mothers have physical contact with the pregnancy, daily reminders of the growth and change occurring. They connect by talking to their bellies, by wondering what life will be like this time next year, look for clothes, cribs, pediatricians and more. Fathers often become attached in different ways: considering buying a new car or safe car seat, working more to earn a little more money before the baby arrives, or by reconsidering  the familys financial status. However bonding occurs, it generally increases as the baby grows. For family members, co-workers and friends, the pregnancy may be less real. They may only know of the pregnancy from a physical or emotional distance. Since many of [place]  are so uncomfortable with death and we work hard to deny it. With a prenatal loss, the lack of contact with the unborn baby may encourage others to minimize the loss, or even suggest that it was something to feel thankful for at this time suggesting that the pain felt now would be significantly less than at any other time in relationship to this child.  That is why it feels so crazy to be in such pain when others might not understand. You might feel that you are still standing at an emotional graveside and the world wonders when youll be ready to go back to work or why you still cry. Its been four months already. Be assured that it is the rest of the world that is, in this case, acting crazy. When a living child or adult dies, we have understandings, behaviors, rituals that though painful, enable [place] to feel cared for, comforted, acknowledged in our loss, and encourage healing. Unfortunately, too little of this is the case when there is a miscarriage or neonatal loss.  There are many difficult dilemmas that you might face in the wake of the loss. Do you have a funeral? How do you tell people? Do you publish an obituary? What do you do with the remains, both physical and emotional? I encourage people to find a formalized way to acknowledge the loss of the pregnancy and to honor the relationship that did and does still exist and will for the rest of your life. It is your choice as to whether this happens in a funeral service, in a [employer], in a letter to the child who you did not meet or in some manner that makes sense to you religiously, spiritually or emotionally. It is not silly to ask friends or family to participate nor is it improper to keep this intensely personal and private. Grief, however, needs to be a public process. We must grieve among others and with our loved ones. We do not all need to feel the same amount of pain in the loss, but we need to act as a loving  community to support those most hurt at this time. Healthy grieving involves a strengthening of the bonds with others who we love and who love [place]. Some people ask a close friend or relative to contact others and inform them of the loss to minimize the telling of the story. Others find relief in telling friends, family and colleagues about the loss, in that it reminds [place] that the pregnancy was real and that this pain is real. Where one connection is broken, another is reinforced and strengthened. Grieving in isolation can be detrimental. By inviting important others to a funeral, tree planting, or sunset service and by taking them up on an offer to help will help your healing.  Some people have described aspects of grief after a pregnancy loss that are quite different from those present after the loss of an adult relative or friend. Some things that might seem weird or abnormal are indeed, quite normal following this kind of loss. Alberteen Spindle described her experience in the book Understanding: Death of the Wished-for Child. I kept searching for something. I wasnt sure what it was. All of a sudden one  day in the kitchen, I spontaneously got out my kitchen scales and started weighing fruits and vegetables. I realized I was trying to find something that had the identical weight that the baby didI found myself weighing my rolling pinit happened to be the identical length and weight that the baby was. This describes the process of building memories. With miscarriage and neonatal loss, there are too few memories of the pregnancy or the baby itself. Parents may not ever be able to see or hold a baby in these cases, especially in the earlier trimesters, so saving mementos and gathering and providing information are especially important. Alberteen Spindle described a parent who needs to see and feel something that had similar dimensions as the child. This is profoundly different than the  grieving that happens when a 29 year old person dies, when we have years of memories, concrete possessions, photos and history. Though it is a different aspect of grief, it is normal, expected and healthy. Your grief will not make you crazy.  Also, do not be frightened if you feel this loss in a very physical manner. Your arms or chest may ache, convincing you that your heart has broken. Women have described this as a feeling of empty arms. They also describe a similar feeling of an emptiness in their bellies, which was previously full. Some women also swear that they still feel the baby moving in their bellies. Many people report being woken to the sound of a crying baby, having vivid dreams about babies, trying to find their lost babies, or graphic dreams of injuries happening to themselves or others. Breasts may still feel as if they are filling and it may seem as if the body is unaware of the loss. These things can feel quite confusing. If you experience any of these responses, please tell someone about them. They are not a sign of insanity, they are aspects of grief. Keeping them to yourself increases the feelings of isolation and disequilibrium. Be reassured that these are normal experiences after the loss of a pregnancy.   A WORD ABOUT DIFFERENCES BETWEEN MENS AND WOMENS GRIEF  If most people do not fully understand the pain of a mothers grief through pregnancy loss, there is an even greater denial on societys part that the father is in pain. I have seen men in great pain and despair after the loss of a pregnancy and they say that nobody has ever asked them about their loss. People may only ask about their wife/partners pain. To put it simply, one father, head bowed, shoulders heaving as he cried said if one more person asks me how Erskine Squibb is, Ill fly apart. Dont they know I lost a child too? Dont they see the circles under my eyes, my face, my pain? Dont  I count? Couples often move through the grief process in different ways and at different times. Do not assume that your partner is better because they are wanting to take in a movie or go out to eat. This may just be a diversion, a need for a change of scenery, and a yearning for normalcy. Women are usually more comfortable crying and men find safety in action. For this reason, friction can develop between partners, when one wants the other to start behaving like they did before the loss or to cry along with them. Respect one anothers process. Talk to your partner about the specifics. If asked, How are you today? many people will just say fine. Specifically relate  that you have had a hard time with the holiday, or seeing a friends new baby, and ask your partner how they felt in that moment. Sex is very often a very difficult prospect after a miscarriage or loss. It might remind a person of the conception, raise fears of another pregnancy and future children, or seem like too much pleasure to experience when one is otherwise in pain. Many men reconnect with their partner by being sexual and feel increasingly isolated when a partner does not respond. Talk. There may be a comfortable middle ground with each other. If the general patterns of the relationship shift toward uncomfortable dynamics, such as decreased communication, blaming one another, or increased absences from the home, it is important to seek counseling with a professional. This is not an easy time, you have had a loss. Be patient with yourself. Seek help and support from loved ones, friends, professionals. The grief will evolve with time and you will not always be in such immediate or raw pain. Be assured that you may never forget your pregnancy or your baby. Healing takes time. I have compiled a list of books on grief and death that might be useful.  *Anna: A Daughters Life. Trinna Post, [employer], 4098. A  fathers account of his grief at the loss of his infant daughter. *Explaining Death to Children. Freddi Che, [address], 1191. A valuable guide to help adults/parents explain death to children. *When Pregnancy Fails: Families Coping with Miscarriage, Ectopic Pregnancy, Stillbirth, and Infant Death. Jodie Echevaria and Marijean Heath, [employer], 4782. A good resource to understand pregnancy loss, including some information on support for the family. *Hour of Gold, Hour of Lead: Diaries and Letters of Gaspar Cola. [address] [place], 9562. Cheri Fowler writes of the pain and loss in the wake of their young childs abduction. *A Child Dies: A Portrait of Family Grief. Clydene Laming and Penelope [address] [place], [employer] [place], 1308. Dealing with death of unborn children, infants, and children of all ages. Revised by Davonna Belling. Lennox Pippins, MS 08/17/07    COPING WITH THE DEATH OF A BABY Newman Nip, R.N., M.A.T.   You have lost a pregnancy. This is undoubtedly a difficult and sad time for you. Your loss may be hard to believe and it is frustrating to know you are helpless to change anything. This cant possibly be happening to me. Why me? Why not someone else I promise Ill be more careful if I can only have my baby back. These are common reactions to experiencing a miscarriage. Feelings of disbelief and anger, helplessness and guilt, depression and perhaps failure, are real and understandable. You may tell yourself that the guilt you feel is irrational, unreasonable or inappropriate, but it is there. What did I do to cause this? Why wasnt I more careful? If only I had known Is this a punishment for something I did? You may find yourself recounting the days or weeks before your loss, searching for clues that you feel sure you must have missed; searching for valid reasons for how or why this happened. Having something taken away that you cherish  feels shocking and unbelievable. You ask your doctor or midwife for an explanation; friends volunteer their opinions. In many cases, your questions of how or why are not satisfactorily answered. You may have some of the above thoughts or feelings and they may be present in different combinations, differing degrees, and in some confusion. They are understandable and part  of the process of beginning to cope with a significant loss. With any death, it is healthy and essential to allow yourself to experience grief whether you are a man or woman. The grieving process is not something begun and completed in a day, a week or a month. It takes time, understanding, support and acceptance. Sometimes there is no funeral to arrange and attend, no outward recognition of a loss. This may tend to increase feelings of isolation and loneliness Some couples feel they must keep their grief invisible and get over this quickly. Most employers will freely give time off to attend a family funeral, but many do not understand the reasonable and justified request for time off after a baby's death. Loss of a longed-for pregnancy, regardless of how early, is a significant bereavement. Men and women may react to the loss differently. Men may feel more helpless and frustrated than their wives because they often assume a passive-observer role. Waiting and watching can be harder than actively participating, regardless of the outcome. Men do experience a loss in spite of not actively participating in the physical loss. People have different ways of coping with a loss and you need to choose what is most helpful to you. Too often being strong is looked upon as a healthy way of coping, when, in fact, repressing and ignoring very real feelings is not helpful and contributes to serious coping problems. Some feelings and questions that men and women express and ask are:  It hangs over me like a black cloud. Though Ive  tried, I honestly dont know how to work this through. How do I say goodbye?  I feel extremely guilty. I guess I really didnt want this pregnancy. I feel relieved that its over, but so guilty because Im relieved. This isnt right.  Im fine. Life must go on and Im a strong person. Theres really nothing to grieve for anyway, is there?  I feel pressure not to be sad. So I tend to cover up a lot.  How do I respond to well meaning but frustrating platitudes like, Try again soon, dear. Another pregnancy will help. I would like to be pregnant again, but I believe a pregnancy should happen at a time of strength, not sadness.  How do I respond to silence and awkward attempts to avoid the whole issue? (You may be the one that decides to bring up the topic. Friends, relatives and acquaintances often do not know how to respond, so they say nothing).  Im fine until my period comes. It is such a start reminder of the part of me that is lost.  My husband/wife and I always enjoyed a good sex life. Since my loss, I have a hard time allowing myself to feel loving and sensuous. After all if such a loving and pleasurable experience as intercourse started something that ended in such a tragedy, its too scary to think it might happen again. Why doesnt my husband/wife understand?  I have to face reality. Should I just force myself to attend my friends baby shower?  Everyone was so supportive and caring when I was in the hospital. That was four months ago. I cant burden others with my sadness now. Why am I still sad? I should be over this thing by now. Knowing common facts about miscarriage probably will not blot out your negative feelings - denial, anger, disappointment, sadness - but facts can hold some comfort and can help cushion the sadness and guilt. For those  wanting a child, it is frustrating and intensely disappointing to experience any loss.  Knowing clearly that you did not cause this to  happen, that you are not directly responsible of your baby's death by what you did or did not do, can bring a sense of relief and relaxation to your anxieties or guilt. A pregnancy that ends in later months is uniquely difficult also. Your protruding abdomen, your childs movements and heartbeat, are concrete proof of your expectant parenthood. A miscarriage occurring at 6 weeks or at 6 months causes a sudden change of events and feelings. Understandably, you may feel robbed or cheated of a promise of what was to be. Its easy to think that everyone else can have children. Surely Lavenia Atlasve been tricked or badly treated, you think. A fact not commonly know is that at least one in five pregnancies end in miscarriage, which indicates you are not alone. In a room of 25 couples, the odds are that 4 have experienced a miscarriage. Of these 8 people, many have shared your experience and feelings. Of course, this is not an easy topic to talk about, therefore it can appear that miscarriages rarely happen and that you are the only one coping with this loss. Realizing you are note alone can perhaps ease some of the shock and disbelief of WHY Me?  Three actions will help: 1. Giver yourself permission to be sad. It is possible to be sad without becoming chronically depressed. Set limits for yourself if you are concerned about becoming overly depressed. If the sadness begins to wash over you while at work at 2 p.m., tell yourself that you cannot deal with it then, but make an agreement with yourself that at 8 p.m. that night you will. And then do it. Many individuals report a beginning sense of control over their grief when they realize they can create appropriate and private times to be sad, alone, or with a significant other person. If you think you may be severely depressed (i.e. having difficulty getting out of bed in the morning, withdrawal from friends and relatives, insomnia, extended loss  of appetite or overeating, loss of the ability to enjoy anything), this problem is probably not something you can handle yourself. It is important to seek out professional help at this time, or at any time when you are feeling particularly discouraged about your Coping.  2. Find a supportive, trusted person and talk about your feelings and your questions. Build a support system, whether it be your spouse, partner, friend, relative, colleague, clergyman, support group or Pharmacist, hospitalprofessional counselor. You may still feel some pangs of sadness when attending a baby shower, celebrating a holiday, seeing other pregnant couples or passing the date of your expected delivery, but once you work through your loss, coping with your situation will become easier.  3. Also part of the process of life itself, is understanding and accepting incongruent or seemingly contradictory feelings. As one insightful women expressed, Being happy for a friend who has just had a baby (or twins!) is a genuine feeling, but also resentment, anger and frustration are there, all wrapped into one package; two apparently conflicting feelings, but both can exist, be recognized, and accepted.  A miscarriage is an unexpected, often unexplained, death; with any loss you need to give yourself permission to grieve, to be angry, sad or relieved. There is no one appropriate reaction. Acknowledging your feelings that are real and understandable, though they may not be logical or rational, is part of  the grieving process. Allowing the grieving process to happen is a positive way of coping and leads to health resolve and strength to look ahead. There may be times when you may not be able to put words to your feelings, but that does not need to stop you from seeking out and talking with an understanding person. Building your own support system will help you regain confidence and strength within yourself.  RECOMMENDED READING The following  materials are all available through [employer], [address], [place], [place] [address]; phone [phone]. For Adults: Empty Arms by Peri Maris Empty Cradle, Broken Heart by Doyne Keel Ended Beginnings by C. Panuthos & C. Romeo Miscarriage a Probation officer. Resource Miscarriage - A Shattered Dream by Peri Maris Newborn Death a Probation officer. Resource Still To Be Born by [employer] When a Doctor, hospital by [employer]. Church et al (TCF) When [employer] Goodbye by P. Schwiebert and Mort Sawyers When Pregnancy Fails by Kathie Rhodes Borg & Edwyna Ready For Helping Other Children How Do We Tell the Children? by Shaefer & Nunzio Cory No New Baby by [employer] Talking About Death by Whitney Post Jess?? A [employer]. Resource Other helpful materials available locally at [employer]: The Fall of Freddy the Leaf by Reggie Pile, PhD, [employer]. (for children) When Filbert Berthold is Forever - Learning to Live Again After the Loss of a Child by Carlyon Prows, [employer]

## 2019-01-16 NOTE — Progress Notes (Signed)
   01/16/19 1300  Clinical Encounter Type  Visited With Patient not available  Visit Type Follow-up  Referral From Chaplain  Consult/Referral To Chaplain  Chaplain was following up on patient, however, patient was still asleep. Nurse said it was the medicine and thanked the Chaplain for coming to visit.

## 2019-01-16 NOTE — Progress Notes (Signed)
Patient ID: Celestia Khat, female   DOB: 12/31/1990, 28 y.o.   MRN: [id number]   Admit Date: 01/12/2019 Today's Date: 01/16/2019  Subjective: Postpartum Day 3: Cesarean Delivery Patient reports that her pain is controlled. She had a bad dream at 3 am and woke up. She has been feeling some chest tightness and shortness or breath since that time. She denies headache. She denies vision changes.   Objective: Vital signs in last 24 hours: Temp:  [97.6 F (36.4 C)-98.3 F (36.8 C)] 98.3 F (36.8 C) (04/21 0810) Pulse Rate:  [87-114] 114 (04/21 0810) Resp:  [16-18] 18 (04/21 0810) BP: (107-165)/(68-107) 157/97 (04/21 0810) SpO2:  [99 %-100 %] 100 % (04/21 0954)  Physical Exam:  Physical Exam  Constitutional: She is well-developed, well-nourished, and in no distress.  HENT:  Head: Normocephalic and atraumatic.  Eyes: Pupils are equal, round, and reactive to light.  Neck: Normal range of motion. No JVD present.  Cardiovascular: Regular rhythm, S1 normal and S2 normal. Tachycardia present.  No murmur heard. Pulmonary/Chest: Breath sounds normal. No accessory muscle usage. No tachypnea. No respiratory distress.  Abdominal: Soft. Bowel sounds are normal. There is no abdominal tenderness.  No distension, incision intact clean and dry. No erythema, induration or drainage.   Musculoskeletal:        General: No tenderness, deformity or edema.  Skin: Skin is warm and dry.  Psychiatric: Affect normal.    Recent Labs    01/15/19 0423 01/16/19 0504  HGB 7.6* 8.1*  HCT 22.1* 24.0*    Assessment/Plan:  28 y.o. [id number] postoperativeday # 3 LTCS abruption, IUFD, and preeclampsia with severe features  1) Preeclampsia with severe features. Continued severe range blood pressures overnight despite multiple medications. Will increase medication dosages. Patient understands that blood pressure control is essential to prevent further complications such as stroke. Creatine is improving.   - Procardia  XL 90mg  daily - Hydralazine 25mg  po every 8-hrs - Labetalol 600mg  TID  2) Acute blood loss anemia - hemodynamically stable and asymptomatic - po ferrous sulfate  2) Blood Type A+, Rubella Immune, Varicella Immune  3) TDAP status up to date  4) Anxiety- will start xanax PRN  5)Tachycardia with chest pain, will obtain EKG.  6) Disposition - pending consistent BP <160 systolic, <110 diastolic   Christanna R Schuman 01/16/2019, 10:08 AM

## 2019-01-16 NOTE — Progress Notes (Signed)
Discharge instructions provided and reviewed.  Medications and follow up care discussed with MD.  Pt verbalizes understanding.

## 2019-01-16 NOTE — Progress Notes (Signed)
[facility] [place] [facility]     PATIENT NAME: Sierra Jordan    MR#:  [id number]  DATE OF BIRTH:  1990/10/04  SUBJECTIVE:   BP still somewhat labile but improved this afternoon. Pt. Is quite sleepy this afternoon but has no other complaints.  Creatinine is improving.  REVIEW OF SYSTEMS:    Review of Systems  Constitutional: Negative for chills and fever.  HENT: Negative for congestion and tinnitus.   Eyes: Negative for blurred vision and double vision.  Respiratory: Negative for cough, shortness of breath and wheezing.   Cardiovascular: Negative for chest pain, orthopnea and PND.  Gastrointestinal: Negative for abdominal pain, diarrhea, nausea and vomiting.  Genitourinary: Negative for dysuria and hematuria.  Neurological: Negative for dizziness, sensory change and focal weakness.  All other systems reviewed and are negative.   Nutrition: Regular Tolerating Diet: Yes Tolerating PT: Ambulatory.   DRUG ALLERGIES:  No Known Allergies  VITALS:  Blood pressure 105/63, pulse (!) 121, temperature 98.3 F (36.8 C), temperature source Oral, resp. rate 18, height 5\' 3"  (1.6 m), weight 112.9 kg, last menstrual period 05/12/2018, SpO2 99 %, unknown if currently breastfeeding.  PHYSICAL EXAMINATION:   Physical Exam  GENERAL:  28 y.o.-year-old patient lying in bed lethargic/sleepy.  EYES: Pupils equal, round, reactive to light and accommodation. No scleral icterus. Extraocular muscles intact.  HEENT: Head atraumatic, normocephalic. Oropharynx and nasopharynx clear.  NECK:  Supple, no jugular venous distention. No thyroid enlargement, no tenderness.  LUNGS: Normal breath sounds bilaterally, no wheezing, rales, rhonchi. No use of accessory muscles of respiration.  CARDIOVASCULAR: S1, S2 normal. No murmurs, rubs, or gallops.  ABDOMEN: Soft, nontender, nondistended. Bowel sounds present. No organomegaly or mass.  EXTREMITIES: No cyanosis, clubbing or edema b/l.     NEUROLOGIC: Cranial nerves II through XII are intact. No focal Motor or sensory deficits b/l.   PSYCHIATRIC: The patient is alert and oriented x 3.  SKIN: No obvious rash, lesion, or ulcer.    LABORATORY PANEL:   CBC Recent Labs  Lab 01/16/19 0504  WBC 10.8*  HGB 8.1*  HCT 24.0*  PLT 114*   ------------------------------------------------------------------------------------------------------------------  Chemistries  Recent Labs  Lab 01/14/19 0537  01/16/19 0504  NA 133*   < > 137  K 3.9   < > 3.6  CL 106   < > 107  CO2 21*   < > 22  GLUCOSE 93   < > 78  BUN 23*   < > 14  CREATININE 1.82*   < > 1.13*  CALCIUM 7.4*   < > 7.9*  MG 4.9*  --   --   AST 56*  --  28  ALT 32  --  23  ALKPHOS 105  --  85  BILITOT 0.5  --  0.7   < > = values in this interval not displayed.   ------------------------------------------------------------------------------------------------------------------  Cardiac Enzymes No results for input(s): TROPONINI in the last 168 hours. ------------------------------------------------------------------------------------------------------------------  RADIOLOGY:  No results found.   ASSESSMENT AND PLAN:   28 year old female who was admitted to the hospital on the OB/GYN service secondary to placental abruption secondary to preeclampsia and postoperatively after C-section noted to have accelerated hypertension and acute kidney injury.  1.  Accelerated hypertension-secondary to underlying preeclampsia. -Blood pressures remain labile.  Continue labetalol, Procardia and dose increased by OB/GYn.  Cont. Hydralazine.  -Continue PRN IV labetalol hydralazine. -Target blood pressure should be systolics in the 140s to 150s with diastolics in  the 90s for now.  2.  Acute kidney injury- secondary to underlying preeclampsia -Improving with IV fluid hydration.    Renal ultrasound negative for obstruction. -Patient did have proteinuria on her urinalysis  previously.  Prot/Cr ratio is over 3 gm of protein.  - Will refer to nephrology as an outpatient for further follow-up of her renal function and proteinuria. Please place referral to Dr. Thedore MinsSingh.   3.  Placental abruption with stillbirth  -continue further care as per OB/GYN. -Continue pain control as per OB/GYN.  4.  Anemia/Thrombocytopenia-secondary to the preeclampsia. - counts are stable and will cont. To monitor. No bleeding. No acute need for transfusion.   5.  Leukocytosis-secondary to the preeclampsia and stress mediated. - improved  -No acute source of infection.    Discussed with OB/GYN and will sign off the case for now.  Please call back if any further help needed.  Discussed with Dr. Gaynelle ArabianShuman  All the records are reviewed and case discussed with Care Management/Social Worker. Management plans discussed with the patient, family and they are in agreement.  CODE STATUS: Full code  DVT Prophylaxis: Ambulatory/Ted's SCD's.   TOTAL TIME TAKING CARE OF THIS PATIENT: 30 minutes.    Houston SirenVivek J Shaheem Pichon M.D on 01/16/2019 at 1:25 PM  Between 7am to 6pm - Pager - [phone]  After 6pm go to [email or web address] - Social research officer, governmentpassword EPAS [facility]  [facility] [place] [facility]  Office  [phone]  CC: Primary care physician; Patient, No Pcp Per

## 2019-01-16 NOTE — Final Progress Note (Signed)
Patient's blood pressures have been well controlled today on her new blood pressure medication. She has been feeling well. Discussed with patient the importance of taking her medications at home. She has a home blood pressure cuff and will check her blood pressure twice a day and call if they are too high or too low and she is feeling bad. She will follow up in office on Thursday for a blood pressure check. She understands that postpartum preeclampsia can have serious and fatal complications. She understands that her blood pressure regimen may need to be adjusted in the coming weeks. She was offered to be monitored over night, but she is eager to be discharged home.  Discussed wound care with her.  She has a family member who will pick up her medications tonight.  She is undecided about her plans for birth control postpartum. Reviewed option with the patient including Nexplanon, IUD, and DEPO.   Adelene Idler MD [facility], [facility] 01/16/2019 9:33 PM

## 2019-01-16 NOTE — Progress Notes (Signed)
   01/16/19 1100  Clinical Encounter Type  Visited With Patient not available  Visit Type Follow-up  Referral From Chaplain  Consult/Referral To Chaplain  Chaplain was rounding and stop visit patient she was asleep. Chaplain will try later.

## 2019-01-16 NOTE — Progress Notes (Signed)
Pt. has been sleeping steadily since approximately 10:30 AM.  She was given a Xanax at 10:02, after which her family left, and she promptly fell asleep.  This nurse has checked on her hourly, including having brief conversations with her around 12:30 and 14:15 to pass medications - after which she fell asleep again.  At 17:00, she was awake enough to get out of bed and take a shower.  Will encourage her to eat dinner after her shower and will reassess her pain.  If not able to take her for a wheelchair ride outside before shift change, will report off to nightshift to do so early on.  BP stable throughout the day after treatment with PO medication.

## 2019-01-19 ENCOUNTER — Ambulatory Visit (INDEPENDENT_AMBULATORY_CARE_PROVIDER_SITE_OTHER): Payer: Medicaid Other | Admitting: Obstetrics and Gynecology

## 2019-01-19 ENCOUNTER — Encounter: Payer: Self-pay | Admitting: Obstetrics and Gynecology

## 2019-01-19 ENCOUNTER — Other Ambulatory Visit: Payer: Self-pay

## 2019-01-19 DIAGNOSIS — O1495 Unspecified pre-eclampsia, complicating the puerperium: Secondary | ICD-10-CM | POA: Diagnosis not present

## 2019-01-19 DIAGNOSIS — O1410 Severe pre-eclampsia, unspecified trimester: Secondary | ICD-10-CM | POA: Insufficient documentation

## 2019-01-19 HISTORY — DX: Severe pre-eclampsia, unspecified trimester: O14.10

## 2019-01-19 HISTORY — DX: Unspecified pre-eclampsia, complicating the puerperium: O14.95

## 2019-01-19 MED ORDER — NIFEDIPINE ER OSMOTIC RELEASE 60 MG PO TB24: 60.0000 mg | ORAL_TABLET | Freq: Two times a day (BID) | ORAL | 1 refills | Status: DC

## 2019-01-19 NOTE — Progress Notes (Signed)
   Postoperative Follow-up Patient presents post op from cesarean section  7 days ago.  Subjective: She denies fever, chills, nausea and vomiting. Eating a regular diet without difficulty. Pain is controlled with current analgesics. Medications being used: narcotic analgesics including oxycodone.  Activity: increasing slowly. She denies issues with her incision.  Denies headache, visual disturbances, ruq pain.  She obtained a BP cuff today.  Her cuff reading is roughly the same as ours and she was given instructions on its use in clinic today.  Objective: BP (!) 168/100   Wt 245 lb (111.1 kg)   LMP 05/12/2018   BMI 43.40 kg/m   BP recheck 15 minutes later - 152/98 Constitutional: Well nourished, well developed female in no acute distress.  HEENT: normal Skin: Warm and dry.  Abdomen: s/nt/nd/+BS clean, dry, intact and without erythema, induration, warmth, and tenderness, benzoin/steri-strips applied Extremity: no edema   Attempted to do a hemoglobin level check, but machine was not working.  Assessment: 28 y.o. s/p cesarean section progressing well, severe preeclampsia with IUFD.    Plan: Patient has done well after surgery with no apparent complications.  I have discussed the post-operative course to date, and the expected progress moving forward.  The patient understands what complications to be concerned about.    Activity plan: increase slowly Increase nifedipine to 60 mg bid (total 120 mg)  Return in about 3 days (around 01/22/2019) for BP check with Dr. Jean Rosenthal.  Thomasene Mohair, MD 01/19/2019 10:45 AM

## 2019-01-22 ENCOUNTER — Encounter: Payer: Self-pay | Admitting: Obstetrics and Gynecology

## 2019-01-22 ENCOUNTER — Other Ambulatory Visit: Payer: Self-pay

## 2019-01-22 ENCOUNTER — Ambulatory Visit (INDEPENDENT_AMBULATORY_CARE_PROVIDER_SITE_OTHER): Payer: Medicaid Other | Admitting: Obstetrics and Gynecology

## 2019-01-22 VITALS — BP 142/92 | Wt 235.0 lb

## 2019-01-22 DIAGNOSIS — O1495 Unspecified pre-eclampsia, complicating the puerperium: Secondary | ICD-10-CM

## 2019-01-22 DIAGNOSIS — O1413 Severe pre-eclampsia, third trimester: Secondary | ICD-10-CM

## 2019-01-22 NOTE — Progress Notes (Signed)
   Postoperative Follow-up Patient presents post op from cesarean section  10 days ago.  Subjective: She denies fever, chills, nausea and vomiting. Eating a regular diet without difficulty. The patient is not having any pain.  Activity: increasing slowly. She denies issues with her incision.  Denies headache, visual disturbances, ruq pain.  She has been having BP cuff reading similar to the measurement made today.  She is taking the following: Nifedipine: 60mg  bid Labetalol: 600 mg tid Hydralizine: 25mg  tid  Objective: BP (!) 142/92   Wt 235 lb (106.6 kg)   LMP 05/12/2018   BMI 41.63 kg/m   BP recheck 15 minutes later - 152/98 Constitutional: Well nourished, well developed female in no acute distress.  HEENT: normal Skin: Warm and dry.  Abdomen: s/nt/nd/+BS clean, dry, intact and without erythema, induration, warmth, and tenderness, benzoin/steri-strips applied Extremity: no edema    Assessment: 28 y.o. s/p cesarean section progressing well, severe preeclampsia with IUFD.  Blood pressure controlled on current regimen.  Plan: Patient has done well after surgery with no apparent complications.  I have discussed the post-operative course to date, and the expected progress moving forward.  The patient understands what complications to be concerned about.    Activity plan: increase slowly  Follow up with weekly MyChart messages with her most current BP readings. Plan to wean medication over the next several weeks as her blood pressure allows.  She was encouraged to make an appointment should she have any concerns or issues.   Return in about 5 weeks (around 02/26/2019) for Six Week Postpartum.  Thomasene Mohair, MD 01/22/2019 1:42 PM

## 2019-02-02 ENCOUNTER — Other Ambulatory Visit: Payer: Self-pay

## 2019-02-02 NOTE — Telephone Encounter (Signed)
Please advise 

## 2019-02-08 ENCOUNTER — Other Ambulatory Visit: Payer: Self-pay | Admitting: Obstetrics and Gynecology

## 2019-02-12 ENCOUNTER — Other Ambulatory Visit: Payer: Self-pay | Admitting: Obstetrics and Gynecology

## 2019-02-12 DIAGNOSIS — O1495 Unspecified pre-eclampsia, complicating the puerperium: Secondary | ICD-10-CM

## 2019-02-12 NOTE — Telephone Encounter (Signed)
90mg  was refilled by Schuman on 5/15. Please advise

## 2019-02-12 NOTE — Telephone Encounter (Signed)
I'll decline for now since she just got a refill.

## 2019-02-16 ENCOUNTER — Inpatient Hospital Stay: Admit: 2019-02-16 | Payer: Self-pay

## 2019-02-27 ENCOUNTER — Telehealth: Payer: Self-pay

## 2019-02-27 ENCOUNTER — Other Ambulatory Visit (HOSPITAL_COMMUNITY)
Admission: RE | Admit: 2019-02-27 | Discharge: 2019-02-27 | Disposition: A | Discharge status: Home / Self Care | Payer: Medicaid Other | Source: Ambulatory Visit | Attending: Obstetrics and Gynecology | Admitting: Obstetrics and Gynecology

## 2019-02-27 ENCOUNTER — Encounter: Payer: Self-pay | Admitting: Obstetrics and Gynecology

## 2019-02-27 ENCOUNTER — Ambulatory Visit (INDEPENDENT_AMBULATORY_CARE_PROVIDER_SITE_OTHER): Payer: Medicaid Other | Admitting: Obstetrics and Gynecology

## 2019-02-27 ENCOUNTER — Other Ambulatory Visit: Payer: Self-pay

## 2019-02-27 DIAGNOSIS — Z113 Encounter for screening for infections with a predominantly sexual mode of transmission: Secondary | ICD-10-CM | POA: Insufficient documentation

## 2019-02-27 DIAGNOSIS — F5102 Adjustment insomnia: Secondary | ICD-10-CM

## 2019-02-27 DIAGNOSIS — Z124 Encounter for screening for malignant neoplasm of cervix: Secondary | ICD-10-CM | POA: Insufficient documentation

## 2019-02-27 MED ORDER — TRAZODONE HCL 50 MG PO TABS: ORAL_TABLET | ORAL | 1 refills | Status: DC

## 2019-02-27 NOTE — Progress Notes (Signed)
Postpartum Visit  Chief Complaint:  Chief Complaint  Patient presents with  . Postpartum Care    History of Present Illness: Patient is a 28 y.o. [id number] presents for postpartum visit.  Date of delivery: 01/12/2019 Type of delivery: C-Section  Pregnancy or labor problems:  Hypertension , severe pre-eclampsia and stillborn Any problems since the delivery:  Hypertension   Newborn Details:  SINGLETON :  Maternal Details:  Post partum depression/anxiety noted:  Some, but she states that she is coping well. She does have trouble sleeping. Edinburgh Post-Partum Depression Score:  17  Date of last PAP:  05/26/2016 - NILM (See MEDIA TAB, 10/08/2016 "OB Prenatal" for records).   Past Medical History:  Diagnosis Date  . Medical history non-contributory   . Stillbirth     Past Surgical History:  Procedure Laterality Date  . CESAREAN SECTION N/A 01/13/2019   Procedure: CESAREAN SECTION;  Surgeon: Conard Novak, MD;  Location: [facility] ORS;  Service: Obstetrics;  Laterality: N/A;  . DILATION AND CURETTAGE OF UTERUS  2008, 2012   D&E EAB  . DILATION AND CURETTAGE OF UTERUS N/A 11/03/2016   Procedure: DILATATION AND CURETTAGE;  Surgeon: Nadara Mustard, MD;  Location: [facility] ORS;  Service: Gynecology;  Laterality: N/A;    Prior to Admission medications   Medication Sig Start Date End Date Taking? Authorizing Provider  NIFEdipine (PROCARDIA XL/NIFEDICAL-XL) 90 MG 24 hr tablet TAKE 1 TABLET BY MOUTH EVERY DAY Patient not taking: Reported on 02/27/2019 02/09/19   Natale Milch, MD    No Known Allergies   Social History   Socioeconomic History  . Marital status: Single    Spouse name: Not on file  . Number of children: Not on file  . Years of education: Not on file  . Highest education level: Not on file  Occupational History  . Not on file  Social Needs  . Financial resource strain: Not on file  . Food insecurity:    Worry: Not on file    Inability: Not on file  .  Transportation needs:    Medical: Not on file    Non-medical: Not on file  Tobacco Use  . Smoking status: Current Every Day Smoker  . Smokeless tobacco: Never Used  Substance and Sexual Activity  . Alcohol use: No  . Drug use: Yes    Types: Marijuana  . Sexual activity: Yes    Birth control/protection: None  Lifestyle  . Physical activity:    Days per week: Not on file    Minutes per session: Not on file  . Stress: Not on file  Relationships  . Social connections:    Talks on phone: Not on file    Gets together: Not on file    Attends religious service: Not on file    Active member of club or organization: Not on file    Attends meetings of clubs or organizations: Not on file    Relationship status: Not on file  . Intimate partner violence:    Fear of current or ex partner: Not on file    Emotionally abused: Not on file    Physically abused: Not on file    Forced sexual activity: Not on file  Other Topics Concern  . Not on file  Social History Narrative  . Not on file    Family History  Problem Relation Age of Onset  . Diabetes Mother   . Hyperlipidemia Mother   . Hypertension Mother   . Depression Mother   .  Pancreatitis Mother   . Diabetes Father   . Breast cancer Maternal Aunt     Review of Systems  Constitutional: Negative.   HENT: Negative.   Eyes: Negative.   Respiratory: Negative.   Cardiovascular: Negative.   Gastrointestinal: Negative.   Genitourinary: Negative.   Musculoskeletal: Negative.   Skin: Negative.   Neurological: Negative.   Psychiatric/Behavioral: Negative.      Physical Exam BP 122/84   Ht  (1.575 m)   Wt 236 lb (107 kg)   LMP 05/12/2018   BMI 43.16 kg/m   Physical Exam Constitutional:      General: She is not in acute distress.    Appearance: Normal appearance. She is well-developed.  Genitourinary:     Pelvic exam was performed with patient in the lithotomy position.     Vulva, inguinal canal, urethra, bladder,  vagina, uterus, right adnexa and left adnexa normal.     No posterior fourchette tenderness, injury or lesion present.     Cervical discharge (frothy white) present.     No cervical motion tenderness, friability, lesion, bleeding or polyp.     Uterus is anteverted.  HENT:     Head: Normocephalic and atraumatic.  Eyes:     General: No scleral icterus.    Conjunctiva/sclera: Conjunctivae normal.  Neck:     Musculoskeletal: Normal range of motion and neck supple.  Cardiovascular:     Rate and Rhythm: Normal rate and regular rhythm.     Heart sounds: No murmur. No friction rub. No gallop.   Pulmonary:     Effort: Pulmonary effort is normal. No respiratory distress.     Breath sounds: Normal breath sounds. No wheezing or rales.  Abdominal:     General: Bowel sounds are normal. There is no distension.     Palpations: Abdomen is soft. There is no mass.     Tenderness: There is no abdominal tenderness. There is no guarding or rebound.     Comments: without erythema, induration, warmth, and tenderness. It is clean, dry, and intact.    Musculoskeletal: Normal range of motion.  Neurological:     General: No focal deficit present.     Mental Status: She is alert and oriented to person, place, and time.     Cranial Nerves: No cranial nerve deficit.  Skin:    General: Skin is warm and dry.     Findings: No erythema.  Psychiatric:        Mood and Affect: Mood normal.        Behavior: Behavior normal.        Judgment: Judgment normal.      Female Chaperone present during breast and/or pelvic exam.  Assessment: 28 y.o. [id number] presenting for 6 week postpartum visit  Plan: Problem List Items Addressed This Visit    None    Visit Diagnoses    Postpartum care and examination    -  Primary   Relevant Medications   traZODone (DESYREL) 50 MG tablet   Other Relevant Orders   Cytology - PAP   Adjustment insomnia       Relevant Medications   traZODone (DESYREL) 50 MG tablet   Pap smear  for cervical cancer screening       Relevant Orders   Cytology - PAP   Screen for STD (sexually transmitted disease)       Relevant Orders   Cytology - PAP     1) Contraception Education given regarding options for contraception, including oral  contraceptives.  Will have her start with Slynd (progesterone-only) until BP normalizes and needs no anti-hypertensives.  At that point she will change to lo loestrin Fe.  She was given samples for 2 months of Slynd and one month of Lo loestrin.    2) Severe pre-eclampsia: Continue to wean down and anti-hypertensives. She has been on 3 agents and is now on one. Will drop dosing by 30mg  per week until she no longer needs the medication.  She has the ability to check her BPs at home and will make sure she sees nothing > 140/90. If not, she will lower her dose weekly. She states that she has enough pills at home to do this. However, she will let me know, if she needs medication to complete the wean.  No symptoms today, as her issue seems to be resolving.   2)  Pap - ASCCP guidelines and rational discussed.  Patient opts for routine screening interval.  Pap smear collected today. Patient requested STD screening and this was done as part of her pap smear.   3) Patient underwent screening for postpartum depression with some concerns noted. She states that she is doing well and is well supported.  Precautions regarding  SI/HI (she has neither of these today) were given. She was instructed to go to the ER should they develop. I offered information for support groups of mothers who have had stillbirths.  She was also offered a hotline number for anxiety/depression. She politely declined all of these things.   4) Follow up 1 year for routine annual exam  Thomasene MohairStephen Kada Friesen, MD 02/27/2019 2:29 PM

## 2019-02-27 NOTE — Telephone Encounter (Signed)
FMLA/DISABILITY form for [employer] filled out, signature obtained and given to KT for processing.

## 2019-03-02 LAB — CYTOLOGY - PAP
Chlamydia: NEGATIVE
Diagnosis: NEGATIVE
Neisseria Gonorrhea: NEGATIVE
Trichomonas: NEGATIVE

## 2019-03-13 ENCOUNTER — Other Ambulatory Visit: Payer: Self-pay | Admitting: Obstetrics and Gynecology

## 2019-03-13 DIAGNOSIS — F5102 Adjustment insomnia: Secondary | ICD-10-CM

## 2019-03-13 NOTE — Telephone Encounter (Signed)
Advise

## 2019-10-31 ENCOUNTER — Other Ambulatory Visit: Payer: Self-pay

## 2019-10-31 ENCOUNTER — Emergency Department
Admission: EM | Admit: 2019-10-31 | Discharge: 2019-10-31 | Disposition: A | Discharge status: Home / Self Care | Payer: Medicaid Other | Attending: Emergency Medicine | Admitting: Emergency Medicine

## 2019-10-31 DIAGNOSIS — N760 Acute vaginitis: Secondary | ICD-10-CM | POA: Insufficient documentation

## 2019-10-31 DIAGNOSIS — R1030 Lower abdominal pain, unspecified: Secondary | ICD-10-CM | POA: Diagnosis present

## 2019-10-31 DIAGNOSIS — Z79899 Other long term (current) drug therapy: Secondary | ICD-10-CM | POA: Insufficient documentation

## 2019-10-31 DIAGNOSIS — I10 Essential (primary) hypertension: Secondary | ICD-10-CM | POA: Insufficient documentation

## 2019-10-31 DIAGNOSIS — B9689 Other specified bacterial agents as the cause of diseases classified elsewhere: Secondary | ICD-10-CM

## 2019-10-31 DIAGNOSIS — F17200 Nicotine dependence, unspecified, uncomplicated: Secondary | ICD-10-CM | POA: Insufficient documentation

## 2019-10-31 HISTORY — DX: Essential (primary) hypertension: I10

## 2019-10-31 LAB — CBC
HCT: 33.8 % — ABNORMAL LOW (ref 36.0–46.0)
Hemoglobin: 11.5 g/dL — ABNORMAL LOW (ref 12.0–15.0)
MCH: 25.2 pg — ABNORMAL LOW (ref 26.0–34.0)
MCHC: 34 g/dL (ref 30.0–36.0)
MCV: 74 fL — ABNORMAL LOW (ref 80.0–100.0)
Platelets: 329 10*3/uL (ref 150–400)
RBC: 4.57 MIL/uL (ref 3.87–5.11)
RDW: 17.9 % — ABNORMAL HIGH (ref 11.5–15.5)
WBC: 7.1 10*3/uL (ref 4.0–10.5)
nRBC: 0 % (ref 0.0–0.2)

## 2019-10-31 LAB — URINALYSIS, COMPLETE (UACMP) WITH MICROSCOPIC
Bilirubin Urine: NEGATIVE
Glucose, UA: NEGATIVE mg/dL
Hgb urine dipstick: NEGATIVE
Ketones, ur: NEGATIVE mg/dL
Leukocytes,Ua: NEGATIVE
Nitrite: NEGATIVE
Protein, ur: NEGATIVE mg/dL
Specific Gravity, Urine: 1.02 (ref 1.005–1.030)
pH: 7 (ref 5.0–8.0)

## 2019-10-31 LAB — COMPREHENSIVE METABOLIC PANEL
ALT: 10 U/L (ref 0–44)
AST: 13 U/L — ABNORMAL LOW (ref 15–41)
Albumin: 3.8 g/dL (ref 3.5–5.0)
Alkaline Phosphatase: 60 U/L (ref 38–126)
Anion gap: 7 (ref 5–15)
BUN: 13 mg/dL (ref 6–20)
CO2: 25 mmol/L (ref 22–32)
Calcium: 8.7 mg/dL — ABNORMAL LOW (ref 8.9–10.3)
Chloride: 105 mmol/L (ref 98–111)
Creatinine, Ser: 0.54 mg/dL (ref 0.44–1.00)
GFR calc Af Amer: >60 mL/min (ref >60)
GFR calc non Af Amer: >60 mL/min (ref >60)
Glucose, Bld: 111 mg/dL — ABNORMAL HIGH (ref 70–99)
Potassium: 3.7 mmol/L (ref 3.5–5.1)
Sodium: 137 mmol/L (ref 135–145)
Total Bilirubin: 0.5 mg/dL (ref 0.3–1.2)
Total Protein: 7.2 g/dL (ref 6.5–8.1)

## 2019-10-31 LAB — WET PREP, GENITAL
Sperm: NONE SEEN
Trich, Wet Prep: NONE SEEN
Yeast Wet Prep HPF POC: NONE SEEN

## 2019-10-31 LAB — POCT PREGNANCY, URINE: Preg Test, Ur: NEGATIVE

## 2019-10-31 LAB — LIPASE, BLOOD: Lipase: 26 U/L (ref 11–51)

## 2019-10-31 MED ORDER — METRONIDAZOLE 500 MG PO TABS: 500.0000 mg | ORAL_TABLET | Freq: Two times a day (BID) | ORAL | 0 refills | Status: DC

## 2019-10-31 MED ORDER — ONDANSETRON 4 MG PO TBDP
4.0000 mg | ORAL_TABLET | Freq: Once | ORAL | Status: AC
  Administered 2019-10-31: 4 mg via ORAL
  Filled 2019-10-31: qty 1

## 2019-10-31 MED ORDER — SODIUM CHLORIDE 0.9% FLUSH: 3.0000 mL | Freq: Once | INTRAVENOUS | Status: DC

## 2019-10-31 NOTE — ED Triage Notes (Signed)
Pt c/o N/V with lower abd pain since yesterday. Pt is not in any acute distress at this time.

## 2019-10-31 NOTE — ED Provider Notes (Signed)
[facility] Emergency Department Provider Note   ____________________________________________    I have reviewed the triage vital signs and the nursing notes.   HISTORY  Chief Complaint Abdominal Pain     HPI Sierra Jordan is a 29 y.o. female who presents with complaints of lower abdominal discomfort.  She describes it as cramping pain and intermittent.  Denies fevers or chills.  Does report vaginal discharge that developed 3 days ago, her discomfort in her lower abdomen developed 2 days ago.  She has had a C-section, no other surgeries.  Some nausea no vomiting.  Normal stools.  Has not take anything for this.  No dysuria, no frequency  Past Medical History:  Diagnosis Date  . Hypertension   . Medical history non-contributory   . Stillbirth     Patient Active Problem List   Diagnosis Date Noted  . Preeclampsia, severe 01/19/2019  . Preeclampsia in postpartum period 01/19/2019  . Acute kidney injury (HCC) 01/13/2019  . Stillbirth with antepartum death Jan 23, 2019  . Supervision of high risk pregnancy, antepartum 11/02/2016  . BMI 37.0-37.9, adult 11/02/2016  . Gestational hypertension 11/02/2016  . Hyperemesis gravidarum with dehydration 04/22/2016    Past Surgical History:  Procedure Laterality Date  . CESAREAN SECTION N/A 01/13/2019   Procedure: CESAREAN SECTION;  Surgeon: Conard Novak, MD;  Location: [facility] ORS;  Service: Obstetrics;  Laterality: N/A;  . DILATION AND CURETTAGE OF UTERUS  2008, 2012   D&E EAB  . DILATION AND CURETTAGE OF UTERUS N/A 11/03/2016   Procedure: DILATATION AND CURETTAGE;  Surgeon: Nadara Mustard, MD;  Location: [facility] ORS;  Service: Gynecology;  Laterality: N/A;    Prior to Admission medications   Medication Sig Start Date End Date Taking? Authorizing Provider  acetaminophen (TYLENOL) 325 MG tablet Take 2 tablets (650 mg total) by mouth every 6 (six) hours as needed for mild pain or headache. Patient not  taking: Reported on 02/27/2019 01/16/19   Natale Milch, MD  ALPRAZolam Prudy Feeler) 0.5 MG tablet Take 1 tablet (0.5 mg total) by mouth 2 (two) times daily as needed for anxiety. Patient not taking: Reported on 02/27/2019 01/16/19   Natale Milch, MD  ferrous sulfate 325 (65 FE) MG tablet Take 1 tablet (325 mg total) by mouth 2 (two) times daily with a meal. Patient not taking: Reported on 02/27/2019 01/17/19   Natale Milch, MD  hydrALAZINE (APRESOLINE) 25 MG tablet Take 1 tablet (25 mg total) by mouth every 8 (eight) hours. Patient not taking: Reported on 02/27/2019 01/17/19   Adelene Idler R, MD  labetalol (NORMODYNE) 300 MG tablet TAKE 2 TABLETS (600 MG TOTAL) BY MOUTH 3 (THREE) TIMES DAILY. Patient not taking: Reported on 02/27/2019 02/09/19   Natale Milch, MD  metroNIDAZOLE (FLAGYL) 500 MG tablet Take 1 tablet (500 mg total) by mouth 2 (two) times daily after a meal. 10/31/19   Jene Every, MD  NIFEdipine (PROCARDIA XL/NIFEDICAL XL) 60 MG 24 hr tablet Take 1 tablet (60 mg total) by mouth 2 (two) times a day. 01/19/19   Conard Novak, MD  NIFEdipine (PROCARDIA XL/NIFEDICAL-XL) 90 MG 24 hr tablet TAKE 1 TABLET BY MOUTH EVERY DAY Patient not taking: Reported on 02/27/2019 02/09/19   Natale Milch, MD  oxyCODONE-acetaminophen (PERCOCET/ROXICET) 5-325 MG tablet Take 1 tablet by mouth every 6 (six) hours as needed for moderate pain. Patient not taking: Reported on 02/27/2019 01/16/19   Natale Milch, MD  Prenatal Vit-Fe Fumarate-FA (  PRENATAL MULTIVITAMIN) TABS tablet Take 1 tablet by mouth daily at 12 noon. Patient not taking: Reported on 02/27/2019 07/18/18   Benjaman Kindler, MD  traZODone (DESYREL) 50 MG tablet TAKE 1-2 TABLETS AT BEDTIME, AS NEEDED FOR INSOMNIA 03/14/19   Will Bonnet, MD     Allergies Patient has no known allergies.  Family History  Problem Relation Age of Onset  . Diabetes Mother   . Hyperlipidemia Mother   . Hypertension Mother    . Depression Mother   . Pancreatitis Mother   . Diabetes Father   . Breast cancer Maternal Aunt     Social History Social History   Tobacco Use  . Smoking status: Current Every Day Smoker  . Smokeless tobacco: Never Used  Substance Use Topics  . Alcohol use: No  . Drug use: Yes    Types: Marijuana    Review of Systems  Constitutional: No fever/chills Eyes: No visual changes.  ENT: No sore throat. Cardiovascular: Denies chest pain. Respiratory: Denies shortness of breath. Gastrointestinal: As above Genitourinary: As above. Musculoskeletal: Negative for back pain. Skin: Negative for rash. Neurological: Negative for headaches    ____________________________________________   PHYSICAL EXAM:  VITAL SIGNS: ED Triage Vitals  Enc Vitals Group     BP 10/31/19 0930 (!) 137/93     Pulse Rate 10/31/19 0930 87     Resp 10/31/19 0930 18     Temp 10/31/19 0932 98.2 F (36.8 C)     Temp Source 10/31/19 0930 Oral     SpO2 10/31/19 0930 99 %     Weight 10/31/19 0930 104.3 kg (230 lb)     Height 10/31/19 0930 1.6 m (5\' 3" )     Head Circumference --      Peak Flow --      Pain Score 10/31/19 0930 2     Pain Loc --      Pain Edu? --      Excl. in [place]? --     Constitutional: Alert and oriented.   Nose: No congestion/rhinnorhea. Mouth/Throat: Mucous membranes are moist.    Cardiovascular: Normal rate, regular rhythm.  Good peripheral circulation. Respiratory: Normal respiratory effort.  No retractions.  Gastrointestinal: Soft and nontender. No distention.  No CVA tenderness.  Reassuring exam Genitourinary: No evidence of cervicitis, small amount of whitish discharge Musculoskeletal:  Warm and well perfused Neurologic:  Normal speech and language. No gross focal neurologic deficits are appreciated.  Skin:  Skin is warm, dry and intact. No rash noted. Psychiatric: Mood and affect are normal. Speech and behavior are normal.  ____________________________________________    LABS (all labs ordered are listed, but only abnormal results are displayed)  Labs Reviewed  WET PREP, GENITAL - Abnormal; Notable for the following components:      Result Value   Clue Cells Wet Prep HPF POC PRESENT (*)    WBC, Wet Prep HPF POC MODERATE (*)    All other components within normal limits  COMPREHENSIVE METABOLIC PANEL - Abnormal; Notable for the following components:   Glucose, Bld 111 (*)    Calcium 8.7 (*)    AST 13 (*)    All other components within normal limits  CBC - Abnormal; Notable for the following components:   Hemoglobin 11.5 (*)    HCT 33.8 (*)    MCV 74.0 (*)    MCH 25.2 (*)    RDW 17.9 (*)    All other components within normal limits  URINALYSIS, COMPLETE (UACMP) WITH MICROSCOPIC -  Abnormal; Notable for the following components:   Color, Urine YELLOW (*)    APPearance CLEAR (*)    Bacteria, UA RARE (*)    All other components within normal limits  GC/CHLAMYDIA PROBE AMP  LIPASE, BLOOD  POC URINE PREG, ED  POCT PREGNANCY, URINE   ____________________________________________  EKG  None ____________________________________________  RADIOLOGY None ____________________________________________   PROCEDURES  Procedure(s) performed: No  Procedures   Critical Care performed: No ____________________________________________   INITIAL IMPRESSION / ASSESSMENT AND PLAN / ED COURSE  Pertinent labs & imaging results that were available during my care of the patient were reviewed by me and considered in my medical decision making (see chart for details).  Patient presents with lower abdominal discomfort, cramping.  Reassuring abdominal exam.  GU exam demonstrates small amount of discharge, wet prep notes some white blood cells and clue cells consistent with bacterial vaginosis which fits the clinical picture.  Lab work overall is unremarkable.  We will start her on Flagyl, outpatient follow-up with GYN.      ____________________________________________   FINAL CLINICAL IMPRESSION(S) / ED DIAGNOSES  Final diagnoses:  Bacterial vaginosis        Note:  This document was prepared using Dragon voice recognition software and may include unintentional dictation errors.   Jene Every, MD 10/31/19 1302

## 2019-10-31 NOTE — ED Notes (Signed)
Pt c/o nausea and vomiting for the last 2 days. Pt reports abd pain that feels like period cramps to mid abd that come and go as well. Pt denies fevers. Pt denies dysuria but states it feels different when she urinates.

## 2019-11-02 LAB — GC/CHLAMYDIA PROBE AMP
Chlamydia trachomatis, NAA: NEGATIVE
Neisseria Gonorrhoeae by PCR: NEGATIVE

## 2020-08-26 ENCOUNTER — Telehealth: Payer: Self-pay

## 2020-08-26 ENCOUNTER — Ambulatory Visit (INDEPENDENT_AMBULATORY_CARE_PROVIDER_SITE_OTHER): Payer: Medicaid Other | Admitting: Obstetrics and Gynecology

## 2020-08-26 ENCOUNTER — Other Ambulatory Visit (HOSPITAL_COMMUNITY)
Admission: RE | Admit: 2020-08-26 | Discharge: 2020-08-26 | Disposition: A | Discharge status: Home / Self Care | Payer: Medicaid Other | Source: Ambulatory Visit | Attending: Obstetrics and Gynecology | Admitting: Obstetrics and Gynecology

## 2020-08-26 ENCOUNTER — Other Ambulatory Visit: Payer: Self-pay

## 2020-08-26 ENCOUNTER — Encounter: Payer: Self-pay | Admitting: Obstetrics and Gynecology

## 2020-08-26 VITALS — BP 122/72 | HR 111 | Wt 237.0 lb

## 2020-08-26 DIAGNOSIS — O09291 Supervision of pregnancy with other poor reproductive or obstetric history, first trimester: Secondary | ICD-10-CM

## 2020-08-26 DIAGNOSIS — Z8759 Personal history of other complications of pregnancy, childbirth and the puerperium: Secondary | ICD-10-CM

## 2020-08-26 DIAGNOSIS — Z369 Encounter for antenatal screening, unspecified: Secondary | ICD-10-CM | POA: Insufficient documentation

## 2020-08-26 DIAGNOSIS — N926 Irregular menstruation, unspecified: Secondary | ICD-10-CM

## 2020-08-26 DIAGNOSIS — F419 Anxiety disorder, unspecified: Secondary | ICD-10-CM

## 2020-08-26 DIAGNOSIS — Z113 Encounter for screening for infections with a predominantly sexual mode of transmission: Secondary | ICD-10-CM

## 2020-08-26 DIAGNOSIS — O26891 Other specified pregnancy related conditions, first trimester: Secondary | ICD-10-CM | POA: Insufficient documentation

## 2020-08-26 DIAGNOSIS — N898 Other specified noninflammatory disorders of vagina: Secondary | ICD-10-CM

## 2020-08-26 DIAGNOSIS — D573 Sickle-cell trait: Secondary | ICD-10-CM

## 2020-08-26 DIAGNOSIS — O219 Vomiting of pregnancy, unspecified: Secondary | ICD-10-CM

## 2020-08-26 DIAGNOSIS — O99211 Obesity complicating pregnancy, first trimester: Secondary | ICD-10-CM

## 2020-08-26 DIAGNOSIS — Z8632 Personal history of gestational diabetes: Secondary | ICD-10-CM

## 2020-08-26 DIAGNOSIS — Z3A09 9 weeks gestation of pregnancy: Secondary | ICD-10-CM

## 2020-08-26 DIAGNOSIS — Z6841 Body Mass Index (BMI) 40.0 and over, adult: Secondary | ICD-10-CM

## 2020-08-26 DIAGNOSIS — O0991 Supervision of high risk pregnancy, unspecified, first trimester: Secondary | ICD-10-CM

## 2020-08-26 HISTORY — DX: Personal history of other complications of pregnancy, childbirth and the puerperium: Z87.59

## 2020-08-26 LAB — POCT URINALYSIS DIPSTICK OB: Glucose, UA: NEGATIVE

## 2020-08-26 LAB — OB RESULTS CONSOLE VARICELLA ZOSTER ANTIBODY, IGG: Varicella: IMMUNE

## 2020-08-26 MED ORDER — DOXYLAMINE-PYRIDOXINE 10-10 MG PO TBEC: 2.0000 | DELAYED_RELEASE_TABLET | Freq: Every day | ORAL | 5 refills | Status: DC

## 2020-08-26 MED ORDER — PRENATAL MULTIVITAMIN CH: 1.0000 | ORAL_TABLET | Freq: Every day | ORAL | 6 refills | Status: DC

## 2020-08-26 MED ORDER — PROMETHAZINE HCL 12.5 MG PO TABS: 12.5000 mg | ORAL_TABLET | Freq: Four times a day (QID) | ORAL | 1 refills | Status: DC | PRN

## 2020-08-26 NOTE — Patient Instructions (Signed)
First Trimester of Pregnancy The first trimester of pregnancy is from week 1 until the end of week 13 (months 1 through 3). A week after a sperm fertilizes an egg, the egg will implant on the wall of the uterus. This embryo will begin to develop into a baby. Genes from you and your partner will form the baby. The female genes will determine whether the baby will be a boy or a girl. At 6-8 weeks, the eyes and face will be formed, and the heartbeat can be seen on ultrasound. At the end of 12 weeks, all the baby's organs will be formed. Now that you are pregnant, you will want to do everything you can to have a healthy baby. Two of the most important things are to get good prenatal care and to follow your health care provider's instructions. Prenatal care is all the medical care you receive before the baby's birth. This care will help prevent, find, and treat any problems during the pregnancy and childbirth. Body changes during your first trimester Your body goes through many changes during pregnancy. The changes vary from woman to woman.  You may gain or lose a couple of pounds at first.  You may feel sick to your stomach (nauseous) and you may throw up (vomit). If the vomiting is uncontrollable, call your health care provider.  You may tire easily.  You may develop headaches that can be relieved by medicines. All medicines should be approved by your health care provider.  You may urinate more often. Painful urination may mean you have a bladder infection.  You may develop heartburn as a result of your pregnancy.  You may develop constipation because certain hormones are causing the muscles that push stool through your intestines to slow down.  You may develop hemorrhoids or swollen veins (varicose veins).  Your breasts may begin to grow larger and become tender. Your nipples may stick out more, and the tissue that surrounds them (areola) may become darker.  Your gums may bleed and may be  sensitive to brushing and flossing.  Dark spots or blotches (chloasma, mask of pregnancy) may develop on your face. This will likely fade after the baby is born.  Your menstrual periods will stop.  You may have a loss of appetite.  You may develop cravings for certain kinds of food.  You may have changes in your emotions from day to day, such as being excited to be pregnant or being concerned that something may go wrong with the pregnancy and baby.  You may have more vivid and strange dreams.  You may have changes in your hair. These can include thickening of your hair, rapid growth, and changes in texture. Some women also have hair loss during or after pregnancy, or hair that feels dry or thin. Your hair will most likely return to normal after your baby is born. What to expect at prenatal visits During a routine prenatal visit:  You will be weighed to make sure you and the baby are growing normally.  Your blood pressure will be taken.  Your abdomen will be measured to track your baby's growth.  The fetal heartbeat will be listened to between weeks 10 and 14 of your pregnancy.  Test results from any previous visits will be discussed. Your health care provider may ask you:  How you are feeling.  If you are feeling the baby move.  If you have had any abnormal symptoms, such as leaking fluid, bleeding, severe headaches, or abdominal   cramping.  If you are using any tobacco products, including cigarettes, chewing tobacco, and electronic cigarettes.  If you have any questions. Other tests that may be performed during your first trimester include:  Blood tests to find your blood type and to check for the presence of any previous infections. The tests will also be used to check for low iron levels (anemia) and protein on red blood cells (Rh antibodies). Depending on your risk factors, or if you previously had diabetes during pregnancy, you may have tests to check for high blood sugar  that affects pregnant women (gestational diabetes).  Urine tests to check for infections, diabetes, or protein in the urine.  An ultrasound to confirm the proper growth and development of the baby.  Fetal screens for spinal cord problems (spina bifida) and Down syndrome.  HIV (human immunodeficiency virus) testing. Routine prenatal testing includes screening for HIV, unless you choose not to have this test.  You may need other tests to make sure you and the baby are doing well. Follow these instructions at home: Medicines  Follow your health care provider's instructions regarding medicine use. Specific medicines may be either safe or unsafe to take during pregnancy.  Take a prenatal vitamin that contains at least 600 micrograms (mcg) of folic acid.  If you develop constipation, try taking a stool softener if your health care provider approves. Eating and drinking   Eat a balanced diet that includes fresh fruits and vegetables, whole grains, good sources of protein such as meat, eggs, or tofu, and low-fat dairy. Your health care provider will help you determine the amount of weight gain that is right for you.  Avoid raw meat and uncooked cheese. These carry germs that can cause birth defects in the baby.  Eating four or five small meals rather than three large meals a day may help relieve nausea and vomiting. If you start to feel nauseous, eating a few soda crackers can be helpful. Drinking liquids between meals, instead of during meals, also seems to help ease nausea and vomiting.  Limit foods that are high in fat and processed sugars, such as fried and sweet foods.  To prevent constipation: ? Eat foods that are high in fiber, such as fresh fruits and vegetables, whole grains, and beans. ? Drink enough fluid to keep your urine clear or pale yellow. Activity  Exercise only as directed by your health care provider. Most women can continue their usual exercise routine during  pregnancy. Try to exercise for 30 minutes at least 5 days a week. Exercising will help you: ? Control your weight. ? Stay in shape. ? Be prepared for labor and delivery.  Experiencing pain or cramping in the lower abdomen or lower back is a good sign that you should stop exercising. Check with your health care provider before continuing with normal exercises.  Try to avoid standing for long periods of time. Move your legs often if you must stand in one place for a long time.  Avoid heavy lifting.  Wear low-heeled shoes and practice good posture.  You may continue to have sex unless your health care provider tells you not to. Relieving pain and discomfort  Wear a good support bra to relieve breast tenderness.  Take warm sitz baths to soothe any pain or discomfort caused by hemorrhoids. Use hemorrhoid cream if your health care provider approves.  Rest with your legs elevated if you have leg cramps or low back pain.  If you develop varicose veins in   your legs, wear support hose. Elevate your feet for 15 minutes, 3-4 times a day. Limit salt in your diet. Prenatal care  Schedule your prenatal visits by the twelfth week of pregnancy. They are usually scheduled monthly at first, then more often in the last 2 months before delivery.  Write down your questions. Take them to your prenatal visits.  Keep all your prenatal visits as told by your health care provider. This is important. Safety  Wear your seat belt at all times when driving.  Make a list of emergency phone numbers, including numbers for family, friends, the hospital, and police and fire departments. General instructions  Ask your health care provider for a referral to a local prenatal education class. Begin classes no later than the beginning of month 6 of your pregnancy.  Ask for help if you have counseling or nutritional needs during pregnancy. Your health care provider can offer advice or refer you to specialists for help  with various needs.  Do not use hot tubs, steam rooms, or saunas.  Do not douche or use tampons or scented sanitary pads.  Do not cross your legs for long periods of time.  Avoid cat litter boxes and soil used by cats. These carry germs that can cause birth defects in the baby and possibly loss of the fetus by miscarriage or stillbirth.  Avoid all smoking, herbs, alcohol, and medicines not prescribed by your health care provider. Chemicals in these products affect the formation and growth of the baby.  Do not use any products that contain nicotine or tobacco, such as cigarettes and e-cigarettes. If you need help quitting, ask your health care provider. You may receive counseling support and other resources to help you quit.  Schedule a dentist appointment. At home, brush your teeth with a soft toothbrush and be gentle when you floss. Contact a health care provider if:  You have dizziness.  You have mild pelvic cramps, pelvic pressure, or nagging pain in the abdominal area.  You have persistent nausea, vomiting, or diarrhea.  You have a bad smelling vaginal discharge.  You have pain when you urinate.  You notice increased swelling in your face, hands, legs, or ankles.  You are exposed to fifth disease or chickenpox.  You are exposed to German measles (rubella) and have never had it. Get help right away if:  You have a fever.  You are leaking fluid from your vagina.  You have spotting or bleeding from your vagina.  You have severe abdominal cramping or pain.  You have rapid weight gain or loss.  You vomit blood or material that looks like coffee grounds.  You develop a severe headache.  You have shortness of breath.  You have any kind of trauma, such as from a fall or a car accident. Summary  The first trimester of pregnancy is from week 1 until the end of week 13 (months 1 through 3).  Your body goes through many changes during pregnancy. The changes vary from  woman to woman.  You will have routine prenatal visits. During those visits, your health care provider will examine you, discuss any test results you may have, and talk with you about how you are feeling. This information is not intended to replace advice given to you by your health care provider. Make sure you discuss any questions you have with your health care provider. Document Revised: 08/26/2017 Document Reviewed: 08/25/2016 Elsevier Patient Education  2020 [employer]  

## 2020-08-26 NOTE — Telephone Encounter (Signed)
Patient scheduled for NOB 09/04/20  Requesting meds for nausea. Cb# [phone].

## 2020-08-26 NOTE — Telephone Encounter (Signed)
Spoke w/patient she has taken all phenergan prescribed 08/19/20. Advised will have front desk to contact for a sooner appt.

## 2020-08-26 NOTE — Progress Notes (Addendum)
New Obstetric Patient H&P    Chief Complaint: Missed period   History of Present Illness: Patient is a 29 y.o. [id number] Not Hispanic or Latino female, presents with amenorrhea and positive home pregnancy test. Patient's last menstrual period was 06/23/2020. and based on her  LMP, her EDD is Estimated Date of Delivery: 03/30/21 and her EGA is [redacted]w[redacted]d. Cycles are 7. days, regular, and occur approximately every : 28 days. Her last pap smear was 1 years ago and was no abnormalities.    She had a urine pregnancy test which was positive approximately 3 week(s)  ago. Her last menstrual period was normal and lasted for  7 day(s). Since her LMP she claims she has experienced severe nausea associated with vomiting, notes breast changes and tenderness, and fatigue. Patient seen at [facility] for N/V - prescribed phenergan PO which was helpful for sx control. She denies vaginal bleeding. Patient reports an increase in anxiety. Her past medical history is contibutory. Her prior pregnancies are notable for SAB x3, TAB x2, G6 - 2018 full-term SVD with PPH and retained placenta, G7 - 2020 35 week IUFD complicated by GDM, severe preE, placental abruption.  Since her LMP, she admits to the use of tobacco products  Yes - daily "black 'n milds" She reports weight loss - unsure of amount - since start of pregnancy.  There are cats in the home in the home  no If yes n/a She admits close contact with children on a regular basis  no  She has had chicken pox in the past no She has had Tuberculosis exposures, symptoms, or previously tested positive for TB   no Current or past history of domestic violence. no  Genetic Screening/Teratology Counseling: (Includes patient, baby's father, or anyone in either family with:)   1. Patient's age >/= 65 at [facility]  no 2. Thalassemia ([place], [place], Mediterranean, or Asian background): MCV<80  no 3. Neural tube defect (meningomyelocele, spina bifida, anencephaly)  no 4. Congenital heart  defect  no  5. Down syndrome  no 6. Tay-Sachs (Jewish, [place]  no 7. Canavan's Disease  no 8. Sickle cell disease or trait (African)  yes  - Sickle Cell C trait 9. Hemophilia or other blood disorders  no  10. Muscular dystrophy  no  11. Cystic fibrosis  no  12. Huntington's Chorea  no  13. Mental retardation/autism  yes 14. Other inherited genetic or chromosomal disorder  no 15. Maternal metabolic disorder (DM, PKU, etc)  no 16. Patient or FOB with a child with a birth defect not listed above no  16a. Patient or FOB with a birth defect themselves no 17. Recurrent pregnancy loss, or stillbirth  yes - see pertinent history above 18. Any medications since LMP other than prenatal vitamins (include vitamins, supplements, OTC meds, drugs, alcohol)  Yes - phenergan fro 19. Any other genetic/environmental exposure to discuss  no  Infection History:   1. Lives with someone with TB or TB exposed  no  2. Patient or partner has history of genital herpes  no 3. Rash or viral illness since LMP  no 4. History of STI (GC, CT, HPV, syphilis, HIV)  yes 5. History of recent travel :  no  Other pertinent information:  Employed as a Associate Professor.      Review of Systems:10 point review of systems negative unless otherwise noted in HPI  Past Medical History:  Patient Active Problem List   Diagnosis Date Noted  . History of pregnancy  induced hypertension 08/26/2020  . History of gestational diabetes 08/26/2020  . Nausea and vomiting during pregnancy 08/26/2020  . BMI 40.0-44.9, adult (HCC) 08/26/2020  . Sickle cell trait (HCC) 08/26/2020  . Pregnancy with poor reproductive history in first trimester 08/26/2020  . Stillbirth with antepartum death 01/12/2019  . Supervision of high risk pregnancy in first trimester 11/02/2016    Clinic [facility]  Dating  US ordered Blood type: --/--/A POS Performed at [facility], [address] [place], [place] [address]   [phone](10/19 0925)   Genetic Screen 1 Screen:    AFP:     Quad:     NIPS: Antibody:   Anatomic US  Rubella:   Varicella: @VZVIGG @  GTT Early:               Third trimester:  RPR:     Rhogam  HBsAg:     TDaP vaccine   Flu Shot: declines HIV:     Baby Food                                GBS:   Contraception  Pap: UTD 2020  CBB     CS/VBAC    Support Person Lollie SailsHarry       . Obesity affecting pregnancy in first trimester 11/02/2016    BMI >=40 [ ]  early 1h gtt -  [ ]  screen sleep apnea [ ]  u/s for dating [ ]   [ ]  nutritional goals [ ]  folic acid 1mg  [ ]  bASA (>12 weeks) [ ]  consider nutrition consult [ ]  consider maternal EKG 1st trimester [ ]  Growth u/s 28 [ ] , 32 [ ] , 36 weeks [ ]  [ ]  NST/AFI weekly 34+ weeks (34[] ,35[] ,36[] , 37[] , 38[] , 39[] , 40[] ) [ ]  IOL by 41 weeks (scheduled, prn [] )     Past Surgical History:  Past Surgical History:  Procedure Laterality Date  . CESAREAN SECTION N/A 01/13/2019   Procedure: CESAREAN SECTION;  Surgeon: Conard NovakJackson, Stephen D, MD;  Location: [facility] ORS;  Service: Obstetrics;  Laterality: N/A;  . DILATION AND CURETTAGE OF UTERUS  2008, 2012   D&E EAB  . DILATION AND CURETTAGE OF UTERUS N/A 11/03/2016   Procedure: DILATATION AND CURETTAGE;  Surgeon: Nadara Mustardobert P Harris, MD;  Location: [facility] ORS;  Service: Gynecology;  Laterality: N/A;    Gynecologic History: Patient's last menstrual period was 06/23/2020.  Obstetric History: [id number]  Family History:  Family History  Problem Relation Age of Onset  . Diabetes Mother   . Hyperlipidemia Mother   . Hypertension Mother   . Depression Mother   . Pancreatitis Mother   . Diabetes Father   . Breast cancer Maternal Aunt     Social History:  Social History   Socioeconomic History  . Marital status: Single    Spouse name: Not on file  . Number of children: Not on file  . Years of education: Not on file  . Highest education level: Not on file  Occupational History  . Not on file  Tobacco Use  .  Smoking status: Current Every Day Smoker  . Smokeless tobacco: Never Used  Substance and Sexual Activity  . Alcohol use: No  . Drug use: Yes    Types: Marijuana  . Sexual activity: Yes    Birth control/protection: None  Other Topics Concern  . Not on file  Social History Narrative  . Not on file   Social Determinants  of Health   Financial Resource Strain:   . Difficulty of Paying Living Expenses: Not on file  Food Insecurity:   . Worried About Programme researcher, broadcasting/film/video in the Last Year: Not on file  . Ran Out of Food in the Last Year: Not on file  Transportation Needs:   . Lack of Transportation (Medical): Not on file  . Lack of Transportation (Non-Medical): Not on file  Physical Activity:   . Days of Exercise per Week: Not on file  . Minutes of Exercise per Session: Not on file  Stress:   . Feeling of Stress : Not on file  Social Connections:   . Frequency of Communication with Friends and Family: Not on file  . Frequency of Social Gatherings with Friends and Family: Not on file  . Attends Religious Services: Not on file  . Active Member of Clubs or Organizations: Not on file  . Attends Banker Meetings: Not on file  . Marital Status: Not on file  Intimate Partner Violence:   . Fear of Current or Ex-Partner: Not on file  . Emotionally Abused: Not on file  . Physically Abused: Not on file  . Sexually Abused: Not on file    Allergies:  No Known Allergies  Medications: Prior to Admission medications   Medication Sig Start Date End Date Taking? Authorizing Provider  promethazine (PHENERGAN) 12.5 MG tablet Take 1 tablet (12.5 mg total) by mouth every 6 (six) hours as needed for up to 15 days for nausea or vomiting. 08/26/20 09/10/20 Yes Milanna Kozlov, CNM  acetaminophen (TYLENOL) 325 MG tablet Take 2 tablets (650 mg total) by mouth every 6 (six) hours as needed for mild pain or headache. Patient not taking: Reported on 02/27/2019 01/16/19   Natale Milch, MD   ALPRAZolam Prudy Feeler) 0.5 MG tablet Take 1 tablet (0.5 mg total) by mouth 2 (two) times daily as needed for anxiety. Patient not taking: Reported on 02/27/2019 01/16/19   Natale Milch, MD  Doxylamine-Pyridoxine (DICLEGIS) 10-10 MG TBEC Take 2 tablets by mouth at bedtime. If symptoms persist, add one tablet in the morning and one in the afternoon 08/26/20   Zipporah Plants, CNM  ferrous sulfate 325 (65 FE) MG tablet Take 1 tablet (325 mg total) by mouth 2 (two) times daily with a meal. Patient not taking: Reported on 02/27/2019 01/17/19   Natale Milch, MD  hydrALAZINE (APRESOLINE) 25 MG tablet Take 1 tablet (25 mg total) by mouth every 8 (eight) hours. Patient not taking: Reported on 02/27/2019 01/17/19   Adelene Idler R, MD  labetalol (NORMODYNE) 300 MG tablet TAKE 2 TABLETS (600 MG TOTAL) BY MOUTH 3 (THREE) TIMES DAILY. Patient not taking: Reported on 02/27/2019 02/09/19   Natale Milch, MD  metroNIDAZOLE (FLAGYL) 500 MG tablet Take 1 tablet (500 mg total) by mouth 2 (two) times daily after a meal. Patient not taking: Reported on 08/26/2020 10/31/19   Jene Every, MD  NIFEdipine (PROCARDIA XL/NIFEDICAL XL) 60 MG 24 hr tablet Take 1 tablet (60 mg total) by mouth 2 (two) times a day. 01/19/19   Conard Novak, MD  NIFEdipine (PROCARDIA XL/NIFEDICAL-XL) 90 MG 24 hr tablet TAKE 1 TABLET BY MOUTH EVERY DAY Patient not taking: Reported on 02/27/2019 02/09/19   Natale Milch, MD  oxyCODONE-acetaminophen (PERCOCET/ROXICET) 5-325 MG tablet Take 1 tablet by mouth every 6 (six) hours as needed for moderate pain. Patient not taking: Reported on 02/27/2019 01/16/19   Natale Milch, MD  Prenatal Vit-Fe Fumarate-FA (PRENATAL MULTIVITAMIN) TABS tablet Take 1 tablet by mouth daily at 12 noon. Patient not taking: Reported on 02/27/2019 07/18/18   Christeen Douglas, MD  traZODone (DESYREL) 50 MG tablet TAKE 1-2 TABLETS AT BEDTIME, AS NEEDED FOR INSOMNIA Patient not taking: Reported on  08/26/2020 03/14/19   Conard Novak, MD    Physical Exam Vitals: Blood pressure 122/72, pulse (!) 111, weight 237 lb (107.5 kg), last menstrual period 06/23/2020, unknown if currently breastfeeding.  General: NAD HEENT: normocephalic, anicteric Thyroid: no enlargement, no palpable nodules Pulmonary: No increased work of breathing, CTAB Cardiovascular: RRR, distal pulses 2+ Abdomen: Obese, NABS, soft, non-tender, non-distended.  Umbilicus without lesions.  No hepatomegaly, splenomegaly or masses palpable. No evidence of hernia  Genitourinary:  External: Normal external female genitalia.  Normal urethral meatus, normal  Bartholin's and Skene's glands.     Vagina: Normal vaginal mucosa, no evidence of prolapse.    Cervix: Grossly normal in appearance, no bleeding  Uterus: Size = dates, mobile, normal contour.  No CMT  Adnexa: ovaries non-enlarged, no adnexal masses  Rectal: deferred Extremities: no edema, erythema, or tenderness Neurologic: Grossly intact Psychiatric: mood appropriate, affect full   Assessment: 29 y.o. [id number] at [redacted]w[redacted]d presenting to initiate prenatal care  Plan: 1) High risk pregnancy - poor obstetric hx - will consult MFM for care recommendations - Plan reviewed with Dr. Jean Rosenthal 2) Hx severe preE - baseline PIH labs today, discussed initiation of 81mg  ASA at 12 weeks 3) Obesity - BMI >40 - early 1h GTT scheduled 4) Hx of GDM - early 1h GTT schedule 5) Nausea/Vomiting - Will start diclegis, may take pheneragan as alternative method 6) Avoid alcoholic beverages. 7) Patient encouraged not to smoke.  8) Discontinue the use of all non-medicinal drugs and chemicals.  9) Take prenatal vitamins daily.  10) Nutrition, food safety (fish, cheese advisories, and high nitrite foods) and exercise discussed. 11) Hospital and practice style discussed with cross coverage system.  12) Genetic Screening, such as with 1st Trimester Screening, cell free fetal DNA, AFP testing, and  Ultrasound, as well as with amniocentesis and CVS as appropriate, is discussed with patient. At the conclusion of today's visit patient undecided genetic testing 13) Patient is asked about travel to areas at risk for the 14 virus, and counseled to avoid travel and exposure to mosquitoes or sexual partners who may have themselves been exposed to the virus. Testing is discussed, and will be ordered as appropriate.   [place], CNM, MSN [facility], [facility] 08/26/2020, 8:56 PM

## 2020-08-26 NOTE — Telephone Encounter (Signed)
Per KV patient to be seen this afternoon at 3:50

## 2020-08-27 ENCOUNTER — Encounter: Payer: Self-pay | Admitting: Obstetrics and Gynecology

## 2020-08-27 LAB — RPR+RH+ABO+RUB AB+AB SCR+CB...
Antibody Screen: NEGATIVE
HIV Screen 4th Generation wRfx: NONREACTIVE
Hematocrit: 36.5 % (ref 34.0–46.6)
Hemoglobin: 12.2 g/dL (ref 11.1–15.9)
Hepatitis B Surface Ag: NEGATIVE
MCH: 27.2 pg (ref 26.6–33.0)
MCHC: 33.4 g/dL (ref 31.5–35.7)
MCV: 81 fL (ref 79–97)
Platelets: 355 10*3/uL (ref 150–450)
RBC: 4.49 x10E6/uL (ref 3.77–5.28)
RDW: 15.2 % (ref 11.7–15.4)
RPR Ser Ql: NONREACTIVE
Rh Factor: POSITIVE
Rubella Antibodies, IGG: 9.2 index (ref >0.99)
Varicella zoster IgG: 2964 index (ref >165)
WBC: 6.8 10*3/uL (ref 3.4–10.8)

## 2020-08-27 LAB — COMPREHENSIVE METABOLIC PANEL
ALT: 19 IU/L (ref 0–32)
AST: 21 IU/L (ref 0–40)
Albumin/Globulin Ratio: 1.4 (ref 1.2–2.2)
Albumin: 4.2 g/dL (ref 3.9–5.0)
Alkaline Phosphatase: 69 IU/L (ref 44–121)
BUN/Creatinine Ratio: 17 (ref 9–23)
BUN: 10 mg/dL (ref 6–20)
Bilirubin Total: 0.6 mg/dL (ref 0.0–1.2)
CO2: 19 mmol/L — ABNORMAL LOW (ref 20–29)
Calcium: 9.3 mg/dL (ref 8.7–10.2)
Chloride: 103 mmol/L (ref 96–106)
Creatinine, Ser: 0.59 mg/dL (ref 0.57–1.00)
GFR calc Af Amer: 142 mL/min/{1.73_m2} (ref >59)
GFR calc non Af Amer: 123 mL/min/{1.73_m2} (ref >59)
Globulin, Total: 3 g/dL (ref 1.5–4.5)
Glucose: 118 mg/dL — ABNORMAL HIGH (ref 65–99)
Potassium: 3.9 mmol/L (ref 3.5–5.2)
Sodium: 135 mmol/L (ref 134–144)
Total Protein: 7.2 g/dL (ref 6.0–8.5)

## 2020-08-27 LAB — CERVICOVAGINAL ANCILLARY ONLY
Bacterial Vaginitis (gardnerella): POSITIVE — AB
Candida Glabrata: NEGATIVE
Candida Vaginitis: NEGATIVE
Chlamydia: NEGATIVE
Comment: NEGATIVE
Comment: NEGATIVE
Comment: NEGATIVE
Comment: NEGATIVE
Comment: NEGATIVE
Comment: NORMAL
Neisseria Gonorrhea: NEGATIVE
Trichomonas: NEGATIVE

## 2020-08-27 LAB — PROTEIN / CREATININE RATIO, URINE
Creatinine, Urine: 427.4 mg/dL
Protein, Ur: 65.5 mg/dL
Protein/Creat Ratio: 153 mg/g creat (ref 0–200)

## 2020-08-29 ENCOUNTER — Other Ambulatory Visit: Payer: Self-pay | Admitting: Obstetrics and Gynecology

## 2020-08-29 DIAGNOSIS — B9689 Other specified bacterial agents as the cause of diseases classified elsewhere: Secondary | ICD-10-CM

## 2020-08-29 LAB — URINE CULTURE

## 2020-08-29 MED ORDER — METRONIDAZOLE 0.75 % VA GEL: 1.0000 | Freq: Every day | VAGINAL | 0 refills | Status: AC

## 2020-08-30 LAB — URINE DRUG PANEL 7

## 2020-09-01 ENCOUNTER — Other Ambulatory Visit: Payer: Self-pay | Admitting: Obstetrics and Gynecology

## 2020-09-01 DIAGNOSIS — O09291 Supervision of pregnancy with other poor reproductive or obstetric history, first trimester: Secondary | ICD-10-CM

## 2020-09-04 ENCOUNTER — Other Ambulatory Visit: Payer: Self-pay

## 2020-09-04 ENCOUNTER — Ambulatory Visit: Payer: Medicaid Other | Attending: Obstetrics and Gynecology

## 2020-09-04 ENCOUNTER — Ambulatory Visit (HOSPITAL_BASED_OUTPATIENT_CLINIC_OR_DEPARTMENT_OTHER): Payer: Medicaid Other

## 2020-09-04 ENCOUNTER — Ambulatory Visit (HOSPITAL_BASED_OUTPATIENT_CLINIC_OR_DEPARTMENT_OTHER): Payer: Medicaid Other | Admitting: Obstetrics and Gynecology

## 2020-09-04 ENCOUNTER — Encounter: Payer: Medicaid Other | Admitting: Obstetrics and Gynecology

## 2020-09-04 ENCOUNTER — Ambulatory Visit: Payer: Medicaid Other

## 2020-09-04 DIAGNOSIS — O2621 Pregnancy care for patient with recurrent pregnancy loss, first trimester: Secondary | ICD-10-CM

## 2020-09-04 DIAGNOSIS — Z87891 Personal history of nicotine dependence: Secondary | ICD-10-CM | POA: Diagnosis not present

## 2020-09-04 DIAGNOSIS — O09291 Supervision of pregnancy with other poor reproductive or obstetric history, first trimester: Secondary | ICD-10-CM

## 2020-09-04 DIAGNOSIS — E669 Obesity, unspecified: Secondary | ICD-10-CM | POA: Diagnosis not present

## 2020-09-04 DIAGNOSIS — Z3A00 Weeks of gestation of pregnancy not specified: Secondary | ICD-10-CM

## 2020-09-04 DIAGNOSIS — Z3A10 10 weeks gestation of pregnancy: Secondary | ICD-10-CM

## 2020-09-04 DIAGNOSIS — O34219 Maternal care for unspecified type scar from previous cesarean delivery: Secondary | ICD-10-CM | POA: Diagnosis not present

## 2020-09-04 DIAGNOSIS — D582 Other hemoglobinopathies: Secondary | ICD-10-CM

## 2020-09-04 DIAGNOSIS — Z810 Family history of intellectual disabilities: Secondary | ICD-10-CM

## 2020-09-04 DIAGNOSIS — D573 Sickle-cell trait: Secondary | ICD-10-CM | POA: Diagnosis not present

## 2020-09-04 DIAGNOSIS — O2620 Pregnancy care for patient with recurrent pregnancy loss, unspecified trimester: Secondary | ICD-10-CM

## 2020-09-04 DIAGNOSIS — Z8759 Personal history of other complications of pregnancy, childbirth and the puerperium: Secondary | ICD-10-CM

## 2020-09-04 DIAGNOSIS — Z8632 Personal history of gestational diabetes: Secondary | ICD-10-CM

## 2020-09-04 DIAGNOSIS — O99211 Obesity complicating pregnancy, first trimester: Secondary | ICD-10-CM | POA: Diagnosis not present

## 2020-09-04 DIAGNOSIS — O09299 Supervision of pregnancy with other poor reproductive or obstetric history, unspecified trimester: Secondary | ICD-10-CM

## 2020-09-04 NOTE — Telephone Encounter (Signed)
Spoke w/patient. She has not received any messages regarding her apts today w/[facility] at [facility]. Advised of time/location. She will be at appointments today.

## 2020-09-04 NOTE — Progress Notes (Signed)
Sierra Jordan Length of Consultation: 30 minutes   Sierra Jordan  was referred to [facility]aternal [facility]etal [facility] at [facility] for genetic counseling to due to a history of recurrent pregnancy loss, family history of intellectual disabilties and personal history of hemoglobin C trait.  This note summarizes the information we discussed.    Family history We obtained a detailed family history and pregnancy history.  The patient reported that this is her 8th pregnancy.  She has a healthy 36 year old daughter, one 30 week stillborn due to placental abruption (see MFM notes), three first trimester losses and two elective terminations for personal reasons.  She met with [name] about this history.  We talked about various reasons for recurrent pregnancy loss including blood clotting conditions, chromosome translocations and other causes.  Prior testing in 2020 for antiphospholipid evaluation was normal.  In the family history, there are no other persons with recurrent pregnancy loss and no history of birth defects or known genetic conditions.  She did report one maternal aunt (a maternal half sister to her mother) with intellectual disabilities.  This aunt has no physical birth defects or dysmorphic features.  There is no known reason for her delays.  We reviewed that there may be many reasons for intellectual disabilities including prenatal exposures, environmental contributions, genetic factors or other unknown etiologies.  Without a known cause, it is difficult to determine the chance for other family members to have a similar condition.  Given the combination of this history with her recurrent losses, a chromosome analysis could be considered.  We also offered the option of Fragile X carrier screening due to this history.  Also in the family history, the patient report colon cancer in her father in his 61s and paternal grandfather in his 79s.  We discussed that there may be strong genetic factors involved in colon  cancer in some families, most often when it occurs at young ages. We encouraged her to make her doctor aware of this history and could consider speaking with a cancer genetic counselor at some time if desired.  The father of the baby, [name], reported a strong history of cardiac disease with his father passing away in his 23s, his paternal aunt in her 36s, and his paternal grandmother also at a young age.  His father had a history of hypertension which he did not treat.  Most cardiovascular disease is thought to be multifactorial, or due to a combination of genetic and environmental factors, but in some families the inherited components may be very strong.  He should make his providers aware of this history.  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  The remainder of the family history was reported to be unremarkable for birth defects, intellectual delays, recurrent pregnancy loss or known chromosome abnormalities.  Hemoglobin C trait With regard to Sierra Jordan 's hemoglobin C trait identified in her prenatal labs , we discussed the natural history and autosomal recessive inheritance pattern associated with hemoglobinopathies. Approximately 1 in 40 individuals of African American ancestry has hemoglobin C trait. Individuals with hemoglobin C trait are not expected to have any health problems related to the trait. We reviewed some general information about hemoglobin [place] disease and hemoglobin C disease. These conditions are caused by a change in the hemoglobin. Hemoglobin is the substance in red blood cells that carries oxygen. When there is a change in the structure of the hemoglobin, there are problems in the way the blood carries  oxygen. One specific change causes the cells to take on a sickle, or half moon, shape instead of the usual round shape. This is called sickle cell anemia. Hemoglobin [place] disease can have the same features as sickle cell anemia. People with sickle cell disease  are at an increased risk for infections, stroke, damage to certain organs, painful crises and other medical complications.   The changes that can occur in hemoglobin are caused by changes in the genetic instructions, or genes, which tell our bodies how to grow and develop. We have two copies of all of our genes; one is inherited from the mother and one from the father. For a child to have symptoms of these conditions, both parents must be carriers and both parents must pass on the changed gene to the child. When both parents are carriers, they have a 1 in 13 (or 25%) chance of having an affected child, a 1 in 2 chance for the child to be a carrier for a hemoglobin condition, such as sickle cell trait, and a 1 in 4 chance for the child to not inherit any copies of the changed gene for sickle cell. These risks are for each pregnancy.   In order to determine if a baby is at risk for inheriting sickle cell disease or another hemoglobinopathy, both parents must be tested for changes in the genes for hemoglobin using a test called hemoglobin electrophoresis, performed in combination with a complete blood count. Testing of her partner with a CBC and hemoglobin electrophoresis would confirm this carrier status.  [name] stated that he remembers being told he had sickle cell trait or a "[place]" trait previously. There are many hemoglobin variants and he plans to bring records with him at his next visit to clarify this so that we can determine the significance as it relates to this pregnancy.  If both parents are carriers, then testing of the current pregnancy is available either prior to or after birth. Testing prior to birth could be performed through an amniocentesis. We discussed the benefits and risks of amniocentesis. In addition, we discussed that all newborn babies in [place] are tested for changes in the hemoglobin at birth. [name] indicated that she is not interested in invasive prenatal testing  for any conditions due to the risks.  Routine Screening was made available as follows: Cell free fetal DNA testing from maternal blood can be used to determine whether or not the baby may have either Down syndrome, trisomy 16, or trisomy 20.  This test utilizes a maternal blood sample and DNA sequencing technology to isolate circulating cell free fetal DNA from maternal plasma.  The fetal DNA can then be analyzed for DNA sequences that are derived from the three most common chromosomes involved in aneuploidy, chromosomes 13, 18, and 21.  If the overall amount of DNA is greater than the expected level for any of these chromosomes, aneuploidy is suspected.  While we do not consider it a replacement for invasive testing and karyotype analysis, a negative result from this testing would be reassuring, though not a guarantee of a normal chromosome complement for the baby.  An abnormal result is certainly suggestive of an abnormal chromosome complement, though we would still recommend CVS or amniocentesis to confirm any findings from this testing.  Maternal serum marker screening, a blood test that measures pregnancy proteins, can provide risk assessments for Down syndrome, trisomy 18, and open neural tube defects (spina bifida, anencephaly). Because it does not directly examine  the fetus, it cannot positively diagnose or rule out these problems.  Targeted ultrasound uses high frequency sound waves to create an image of the developing fetus.  An ultrasound is often recommended as a routine means of evaluating the pregnancy.  It is also used to screen for fetal anatomy problems (for example, a heart defect) that might be suggestive of a chromosomal or other abnormality.   Should these screening tests indicate an increased concern, then the following additional testing options would be offered:  The chorionic villus sampling procedure is available for first trimester chromosome analysis.  This involves the  withdrawal of a small amount of chorionic villi (tissue from the developing placenta).  Risk of pregnancy loss is estimated to be approximately 1 in 200 to 1 in 100 (0.5 to 1%).  There is approximately a 1% (1 in 100) chance that the CVS chromosome results will be unclear.  Chorionic villi cannot be tested for neural tube defects.     Amniocentesis involves the removal of a small amount of amniotic fluid from the sac surrounding the fetus with the use of a thin needle inserted through the maternal abdomen and uterus.  Ultrasound guidance is used throughout the procedure.  Fetal cells from amniotic fluid are directly evaluated and > 99.5% of chromosome problems and > 98% of open neural tube defects can be detected. This procedure is generally performed after the 15th week of pregnancy.  The main risks to this procedure include complications leading to miscarriage in less than 1 in 200 cases (0.5%).  Cystic Fibrosis and Spinal Muscular Atrophy (SMA) screening were also discussed with the patient. Both conditions are recessive, which means that both parents must be carriers in order to have a child with the disease.  Cystic fibrosis (CF) is one of the most common genetic conditions in persons of Caucasian ancestry.  This condition occurs in approximately 1 in 2,500 Caucasian persons and results in thickened secretions in the lungs, digestive, and reproductive systems.  For a baby to be at risk for having CF, both of the parents must be carriers for this condition.  Approximately 1 in 96 Caucasian persons is a carrier for CF.  Current carrier testing looks for the most common mutations in the gene for CF and can detect approximately 90% of carriers in the Caucasian population.  This means that the carrier screening can greatly reduce, but cannot eliminate, the chance for an individual to have a child with CF.  If an individual is found to be a carrier for CF, then carrier testing would be available for the partner.  As part of [place] newborn screening profile, all babies born in the state of [place] will have a two-tier screening process.  Specimens are first tested to determine the concentration of immunoreactive trypsinogen (IRT).  The top 5% of specimens with the highest IRT values then undergo DNA testing using a panel of over 40 common CF mutations. SMA is a neurodegenerative disorder that leads to atrophy of skeletal muscle and overall weakness.  This condition is also more prevalent in the Caucasian population, with 1 in 40-1 in 60 persons being a carrier and 1 in 6,000-1 in 10,000 children being affected.  There are multiple forms of the disease, with some causing death in infancy to other forms with survival into adulthood.  The genetics of SMA is complex, but carrier screening can detect up to 95% of carriers in the Caucasian population.  Similar to CF, a negative result  can greatly reduce, but cannot eliminate, the chance to have a child with SMA.  An ultrasound was performed at the time of the visit.  The gestational age was consistent with 10 weeks.  Fetal anatomy could not be assessed due to early gestational age.  Please refer to the ultrasound report for details of that study. The patient was scheduled to return in 3 weeks for a first trimester anatomy ultrasound.  Ms. Star was encouraged to call with questions or concerns.  We can be contacted at [phone].   Plan: FOB to bring lab results to next visit  Touch base when she returns in 3 weeks to determine desired screening or testing   Wilburt Finlay, MS, [place]

## 2020-09-04 NOTE — Progress Notes (Signed)
Maternal-Fetal Medicine   Name: Sierra Jordan DOB: [birth year] MRN: [id number] Referring Provider: Orlie Pollen, CNM  I had the pleasure of seeing Sierra Jordan today at the Maternal Fetal Care, [facility]  She is [phone] at [place]' gestation and is here for ultrasound and consultation. Her problems include: -History of stillbirth at 68 weeks' gestation. -History of recurrent early pregnancy losses.  Obstetric history: In her most-recent pregnancy (April 2020) at 35-weeks' gestation, the patient was admitted to L&D with c/o severe abdominal pain. She did not have vaginal bleeding or leakage of amniotic fluid. On admission, fetal demise was confirmed on ultrasound. The cervix was 3 cm at admission. Patient had intermittent hypotension and given the possibility of placental abruption, cesarean section was performed. Operative note mentions maternal surface of the placenta being covered with blood clots. Patient had an EBL of 1,300 milliliters. Her predelivery hemoglobin was 8 g/dL. Post-operatively, she received 1 unit PRBC and recovered well.  Patient has a diagnosis of preeclampsia at admission. Protein/creatinine ratio was increased.  Toxicology showed no evidence of cocaine use. THC was positive.   Placenta weighed 371 grams with 3-vessel cord. No evidence of funisitis or thrombus. Placental disc showed hemorrhage and infarct.  Antiphospholipid antibodies evaluated after delivery were within normal range.  In 2018, she had a term vaginal delivery, and her child is alive and well. Her pregnancy was complicated by gestational diabetes. Before that pregnancy, she had 3 spontaneous early miscarriages and 2 terminations of pregnancies.   Gyn history: No history of abnormal Pap smears or cervical surgeries. No history of breast disease.  Past medical history: No history of hypertension or diabetes or any other chronic medical conditions. 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  Past surgical history: Cesarean section, D&C. Medications: Prenatal vitamins. Allergies: NKDA. Social history: Denies tobacco or drug or alcohol use. She quit smoking a week ago and she was smoking 1 to 2 cigarettes daily. Her partner is African American and walks with crutches because of ankle injury last year. He is her new partner. Family history: Father died of colon cancer. Mother had pancreatic cancer and is alive with some complications. No history of venous thromboembolism in the family.  Ultrasound On today's ultrasound, a single intrauterine pregnancy is seen. The CRL measurement is consistent with her LMP date and good fetal heart activity is seen.   Blood pressure today at our office is 123/78 mm Hg.  Our concerns include: History of fetal demise following placental abruption I discussed the risk factors predisposing to placental abruption. Preeclampsia is a risk factor. Recurrence of preeclampsia is seen in about 25% to 40% cases. I discussed the benefit of low-dose aspirin that delays or prevents development of preeclampsia. Patient is not allergic to aspirn and does not have any contraindications. I recommended that she start taking aspirin 81 milligrams daily from 13-weeks' gestation till delivery.  Smoking is also associated with placental abruption. Fortunately, the patient quit smoking now. She screened negative for cocaine that can lead to placental abruption.  I reassured her of negative acquired thrombophilia work-up.  No history of abdominal trauma.  I discussed the recurrence rate of placental abruption with the couple. In a large population-based study from birth registry in [place] it was found that mild abruption has a recurrence rate of 6.5% and severe 11.5%. Overall, a recurrence risk of 7% to 10% could be expected.   I discussed ultrasound protocol. We recommend serial fetal growth assessments every  4 weeks and weekly BPP from 32 weeks till  delivery.  Timing of delivery: Provided antenatal testing is reassuring, we recommend delivery at 39 weeks' gestation. If there is considerable maternal anxiety because of history of stillbirth and abruption, delivery may be considered at 22 weeks' gestation.  I did not address VBAC today.  Patient met with our genetic counselor today to discuss early pregnancy losses and hemoglobin C trait.  Recommendations: -First-trimester anatomy in 13 weeks (history of recurrent early pregnancy losses). -Screening for fetal aneuploidies. -Aspirin 81 milligrams daily from 13 weeks till delivery. -Fetal anatomy scan at 18 to 20 weeks. -Fetal growth assessment every 4 weeks from 24 weeks' gestation. -Weekly BPP from 32 weeks. -Delivery at 39 weeks. If considerable maternal anxiety is present or if the patient has symptom of vaginal bleeding, delivery should be considered at 37 weeks. -Smoking cessation counseling if patient resumes smoking. -Our genetic counseling will follow up with the patient. You will be receiving a separate letter from our genetic counselor.  Thank you for consultation. Please contact me at the Maternal Fetal Care, [facility] if you have any questions. Consultation including face-to-face counseling 45 minutes.

## 2020-09-10 ENCOUNTER — Other Ambulatory Visit: Payer: Self-pay

## 2020-09-10 ENCOUNTER — Ambulatory Visit (INDEPENDENT_AMBULATORY_CARE_PROVIDER_SITE_OTHER): Payer: Medicaid Other

## 2020-09-10 ENCOUNTER — Ambulatory Visit (INDEPENDENT_AMBULATORY_CARE_PROVIDER_SITE_OTHER): Payer: Medicaid Other | Admitting: Obstetrics

## 2020-09-10 VITALS — BP 120/80 | Wt 238.0 lb

## 2020-09-10 DIAGNOSIS — O0990 Supervision of high risk pregnancy, unspecified, unspecified trimester: Secondary | ICD-10-CM

## 2020-09-10 DIAGNOSIS — N926 Irregular menstruation, unspecified: Secondary | ICD-10-CM

## 2020-09-10 DIAGNOSIS — O0991 Supervision of high risk pregnancy, unspecified, first trimester: Secondary | ICD-10-CM | POA: Diagnosis not present

## 2020-09-10 DIAGNOSIS — Z3A11 11 weeks gestation of pregnancy: Secondary | ICD-10-CM

## 2020-09-10 LAB — POCT URINALYSIS DIPSTICK OB
Glucose, UA: NEGATIVE
POC,PROTEIN,UA: NEGATIVE

## 2020-09-10 NOTE — Progress Notes (Signed)
No concerns, rj 

## 2020-09-10 NOTE — Progress Notes (Signed)
  Routine Prenatal Care Visit  Subjective  Sierra Jordan is a 29 y.o. [id number] at [redacted]w[redacted]d being seen today for ongoing prenatal care.  She is currently monitored for the following issues for this high-risk pregnancy and has Supervision of high risk pregnancy in first trimester; Obesity affecting pregnancy in first trimester; Stillbirth with antepartum death; History of pregnancy induced hypertension; History of gestational diabetes; Nausea and vomiting during pregnancy; BMI 40.0-44.9, adult (HCC); Sickle cell trait (HCC); and Pregnancy with poor reproductive history in first trimester on their problem list.  ----------------------------------------------------------------------------------- Patient reports no complaints.   She has not had her early 1hr GTT yet.  .  .   Sierra Jordan denies.  ----------------------------------------------------------------------------------- The following portions of the patient's history were reviewed and updated as appropriate: allergies, current medications, past family history, past medical history, past social history, past surgical history and problem list. Problem list updated.  Objective  Blood pressure 120/80, weight 238 lb (108 kg), last menstrual period 06/23/2020, unknown if currently breastfeeding. Pregravid weight 240 lb (108.9 kg) Total Weight Gain -2 lb (-0.907 kg) Urinalysis: Urine Protein Negative  Urine Glucose Negative  Fetal Status:           General:  Alert, oriented and cooperative. Patient is in no acute distress.  Skin: Skin is warm and dry. No rash noted.   Cardiovascular: Normal heart rate noted  Respiratory: Normal respiratory effort, no problems with respiration noted  Abdomen: Soft, gravid, appropriate for gestational age.       Pelvic:  Cervical exam deferred        Extremities: Normal range of motion.     Mental Status: Normal mood and affect. Normal behavior. Normal judgment and thought content.   Assessment   29 y.o.  [id number] at [redacted]w[redacted]d by  03/30/2021, by Last Menstrual Period presenting for routine prenatal visit  Plan   pregnancy 8 Problems (from 08/26/20 to present)    Problem Noted Resolved   History of pregnancy induced hypertension 08/26/2020 by Zipporah Plants, CNM No   History of gestational diabetes 08/26/2020 by Zipporah Plants, CNM No   Obesity affecting pregnancy in first trimester 11/02/2016 by Conard Novak, MD No   Overview Signed 08/26/2020  4:48 PM by Zipporah Plants, CNM    BMI >=40 [ ]  early 1h gtt -  [ ]  screen sleep apnea [ ]  u/s for dating [ ]   [ ]  nutritional goals [ ]  folic acid 1mg  [ ]  bASA (>12 weeks) [ ]  consider nutrition consult [ ]  consider maternal EKG 1st trimester [ ]  Growth u/s 28 [ ] , 32 [ ] , 36 weeks [ ]  [ ]  NST/AFI weekly 34+ weeks (34[] ,35[] ,36[] , 37[] , 38[] , 39[] , 40[] ) [ ]  IOL by 41 weeks (scheduled, prn [] )          Preterm labor symptoms and general obstetric precautions including but not limited to vaginal bleeding, contractions, leaking of Jordan and fetal movement were reviewed in detail with the patient. Please refer to After Visit Summary for other counseling recommendations.  Reminded to make appointment for her early 1hr GTT. Will see the MFM on December 30th.  Return in about 4 weeks (around 10/08/2020) for return OB.  , CNM  09/10/2020 4:13 PM

## 2020-09-22 ENCOUNTER — Other Ambulatory Visit: Payer: Self-pay | Admitting: Obstetrics and Gynecology

## 2020-09-22 DIAGNOSIS — O99211 Obesity complicating pregnancy, first trimester: Secondary | ICD-10-CM

## 2020-09-22 DIAGNOSIS — Z862 Personal history of diseases of the blood and blood-forming organs and certain disorders involving the immune mechanism: Secondary | ICD-10-CM

## 2020-09-22 DIAGNOSIS — O34219 Maternal care for unspecified type scar from previous cesarean delivery: Secondary | ICD-10-CM

## 2020-09-22 DIAGNOSIS — O09299 Supervision of pregnancy with other poor reproductive or obstetric history, unspecified trimester: Secondary | ICD-10-CM

## 2020-09-24 ENCOUNTER — Other Ambulatory Visit: Payer: Medicaid Other

## 2020-09-25 ENCOUNTER — Other Ambulatory Visit: Payer: Self-pay

## 2020-09-25 ENCOUNTER — Ambulatory Visit: Payer: Medicaid Other | Attending: Maternal & Fetal Medicine

## 2020-09-25 DIAGNOSIS — Z862 Personal history of diseases of the blood and blood-forming organs and certain disorders involving the immune mechanism: Secondary | ICD-10-CM

## 2020-09-25 DIAGNOSIS — O09299 Supervision of pregnancy with other poor reproductive or obstetric history, unspecified trimester: Secondary | ICD-10-CM

## 2020-09-25 DIAGNOSIS — O34219 Maternal care for unspecified type scar from previous cesarean delivery: Secondary | ICD-10-CM

## 2020-09-25 DIAGNOSIS — Z8759 Personal history of other complications of pregnancy, childbirth and the puerperium: Secondary | ICD-10-CM

## 2020-09-25 DIAGNOSIS — Z3A13 13 weeks gestation of pregnancy: Secondary | ICD-10-CM

## 2020-09-25 DIAGNOSIS — O99211 Obesity complicating pregnancy, first trimester: Secondary | ICD-10-CM | POA: Insufficient documentation

## 2020-09-25 DIAGNOSIS — O2621 Pregnancy care for patient with recurrent pregnancy loss, first trimester: Secondary | ICD-10-CM | POA: Diagnosis not present

## 2020-09-25 DIAGNOSIS — Z8632 Personal history of gestational diabetes: Secondary | ICD-10-CM

## 2020-09-25 DIAGNOSIS — O09291 Supervision of pregnancy with other poor reproductive or obstetric history, first trimester: Secondary | ICD-10-CM

## 2020-09-30 ENCOUNTER — Telehealth: Payer: Self-pay | Admitting: Obstetrics and Gynecology

## 2020-09-30 NOTE — Telephone Encounter (Signed)
I followed up with Ms. Ciani and she stated that they were not able to locate lab results on the father of the baby and she declined to have him retested at this time.  They plan to follow up after delivery.  We would suggest hemoglobinopathy screening through newborn screening with additional follow up if needed based upon those results.  Cherly Anderson, MS, CGC

## 2020-09-30 NOTE — Telephone Encounter (Signed)
I left a message for Sierra Jordan to call if she would like to speak further about screening in this pregnancy.  She had a return visit to Maternal Fetal Care on 09/25/20 for an ultrasound and possible cell free fetal DNA testing for chromosome conditions (NIPS).  We had spoken previously for genetic counseling and she was undecided about that testing.  At the visit on 12/30, she spoke with our nurse and declined NIPS screening.  In addition, we had spoken previously about the finding of Hemoglobin C trait for her.  At that time, her partner recalled that he also had a hemoglobin trait but he wasn't sure which one (possibly [place]?).  He had planned to bring his lab report at this visit, but did not.  My message for the patient today included our contact number in case she would like to discuss that further, have me review his prior labs, or consider having him retested.  If not, we would suggest follow up with hemoglobinopathy testing on the baby at the time of delivery.  Newborn screening will include this testing.  We may be reached at [phone].  Sierra Anderson, MS, CGC

## 2020-10-08 ENCOUNTER — Encounter: Payer: Self-pay | Admitting: Obstetrics and Gynecology

## 2020-10-08 ENCOUNTER — Ambulatory Visit (INDEPENDENT_AMBULATORY_CARE_PROVIDER_SITE_OTHER): Payer: Medicaid Other | Admitting: Obstetrics and Gynecology

## 2020-10-08 ENCOUNTER — Other Ambulatory Visit: Payer: Medicaid Other

## 2020-10-08 ENCOUNTER — Other Ambulatory Visit: Payer: Self-pay

## 2020-10-08 VITALS — BP 128/72 | Wt 237.0 lb

## 2020-10-08 DIAGNOSIS — Z8632 Personal history of gestational diabetes: Secondary | ICD-10-CM

## 2020-10-08 DIAGNOSIS — O09291 Supervision of pregnancy with other poor reproductive or obstetric history, first trimester: Secondary | ICD-10-CM

## 2020-10-08 DIAGNOSIS — Z3A15 15 weeks gestation of pregnancy: Secondary | ICD-10-CM

## 2020-10-08 DIAGNOSIS — O219 Vomiting of pregnancy, unspecified: Secondary | ICD-10-CM

## 2020-10-08 DIAGNOSIS — O0990 Supervision of high risk pregnancy, unspecified, unspecified trimester: Secondary | ICD-10-CM

## 2020-10-08 DIAGNOSIS — O0991 Supervision of high risk pregnancy, unspecified, first trimester: Secondary | ICD-10-CM

## 2020-10-08 LAB — POCT URINALYSIS DIPSTICK OB: Glucose, UA: NEGATIVE

## 2020-10-08 MED ORDER — ONDANSETRON 4 MG PO TBDP: 4.0000 mg | ORAL_TABLET | Freq: Three times a day (TID) | ORAL | 2 refills | Status: DC | PRN

## 2020-10-08 NOTE — Progress Notes (Signed)
    Routine Prenatal Care Visit  Subjective  Sierra Jordan is a 30 y.o. [id number] at [redacted]w[redacted]d being seen today for ongoing prenatal care.  She is currently monitored for the following issues for this high-risk pregnancy and has Supervision of high risk pregnancy in first trimester; Obesity affecting pregnancy in first trimester; Stillbirth with antepartum death; History of pregnancy induced hypertension; History of gestational diabetes; Nausea and vomiting during pregnancy; BMI 40.0-44.9, adult (HCC); Sickle cell trait (HCC); and Pregnancy with poor reproductive history in first trimester on their problem list.  ----------------------------------------------------------------------------------- Patient reports nausea. Primarily in the AM. Patient requesting zofran. Has tried phenergan in previous pregnancy, makes her too sleepy for work. Reports diclegis has too much of a sedating effect.   Contractions: Not present. Vag. Bleeding: None.  Movement: Absent. Denies leaking of fluid.  ----------------------------------------------------------------------------------- The following portions of the patient's history were reviewed and updated as appropriate: allergies, current medications, past family history, past medical history, past social history, past surgical history and problem list. Problem list updated.   Objective  Blood pressure 128/72, weight 237 lb (107.5 kg), last menstrual period 06/23/2020, unknown if currently breastfeeding. Pregravid weight 240 lb (108.9 kg) Total Weight Gain -3 lb (-1.361 kg) Urinalysis:      Fetal Status: Fetal Heart Rate (bpm): 145   Movement: Absent     General:  Alert, oriented and cooperative. Patient is in no acute distress.  Skin: Skin is warm and dry. No rash noted.   Cardiovascular: Normal heart rate noted  Respiratory: Normal respiratory effort, no problems with respiration noted  Abdomen: Soft, gravid, appropriate for gestational age. Pain/Pressure:  Absent     Pelvic:  Cervical exam deferred        Extremities: Normal range of motion.  Edema: None  ental Status: Normal mood and affect. Normal behavior. Normal judgment and thought content.     Assessment   29 y.o. [id number] at [redacted]w[redacted]d by  03/30/2021, by Last Menstrual Period presenting for routine prenatal visit  Plan   pregnancy 8 Problems (from 08/26/20 to present)    Problem Noted Resolved   History of pregnancy induced hypertension 08/26/2020 by Zipporah Plants, CNM No   History of gestational diabetes 08/26/2020 by Zipporah Plants, CNM No   Obesity affecting pregnancy in first trimester 11/02/2016 by Conard Novak, MD No   Overview Signed 08/26/2020  4:48 PM by Zipporah Plants, CNM    BMI >=40 [x]  early 1h gtt -  [ ]  screen sleep apnea [x]  u/s for dating   [x]  nutritional goals [x]  folic acid 1mg  [x]  bASA (>12 weeks) [ ]  consider nutrition consult [ ]  consider maternal EKG 1st trimester [ ]  Growth u/s 28 [ ] , 32 [ ] , 36 weeks [ ]  [ ]  NST/AFI weekly 34+ weeks (34[] ,35[] ,36[] , 37[] , 38[] , 39[] , 40[] ) [ ]  IOL by 41 weeks (scheduled, prn [] )         -Reviewed MFM recommendation with patient -Zofran rx'd per patient request -Encouraged daily bASA administration, patient stated understanding -1h GTT today -F/u with MFM for anatomy scan  Second trimester precautions including but not limited to vaginal bleeding, contractions, leaking of fluid and fetal movement were reviewed in detail with the patient.    Return in about 4 weeks (around 11/05/2020) for HROB with MD.  , CNM, MSN [facility], [facility] 10/08/2020, 3:23 PM

## 2020-10-09 LAB — GLUCOSE, 1 HOUR GESTATIONAL: Gestational Diabetes Screen: 154 mg/dL — ABNORMAL HIGH (ref 65–139)

## 2020-10-10 ENCOUNTER — Telehealth: Payer: Self-pay

## 2020-10-10 NOTE — Telephone Encounter (Signed)
-----   Message from Zipporah Plants, [place] sent at 10/10/2020  8:20 AM EST ----- Regarding: Schedule 3h GTT Hi, Could you call to schedule this patient for a 3h GTT? Thanks, Jae Dire

## 2020-10-10 NOTE — Telephone Encounter (Signed)
Called and left voicemail for patient to call back to be scheduled. 

## 2020-10-27 ENCOUNTER — Telehealth: Payer: Self-pay

## 2020-10-27 NOTE — Telephone Encounter (Signed)
Patient reports to after hours/on call yeast infection. Inquired how to treat. On call attempted to reach patient x2 w/o success.

## 2020-10-28 ENCOUNTER — Other Ambulatory Visit: Payer: Self-pay | Admitting: Obstetrics and Gynecology

## 2020-10-28 DIAGNOSIS — Z8759 Personal history of other complications of pregnancy, childbirth and the puerperium: Secondary | ICD-10-CM

## 2020-10-28 DIAGNOSIS — Z98891 History of uterine scar from previous surgery: Secondary | ICD-10-CM

## 2020-10-30 ENCOUNTER — Ambulatory Visit: Payer: Medicaid Other | Attending: Obstetrics and Gynecology

## 2020-10-30 ENCOUNTER — Other Ambulatory Visit: Payer: Self-pay

## 2020-10-30 DIAGNOSIS — Z98891 History of uterine scar from previous surgery: Secondary | ICD-10-CM | POA: Diagnosis not present

## 2020-10-30 DIAGNOSIS — Z8632 Personal history of gestational diabetes: Secondary | ICD-10-CM

## 2020-10-30 DIAGNOSIS — O09292 Supervision of pregnancy with other poor reproductive or obstetric history, second trimester: Secondary | ICD-10-CM | POA: Diagnosis not present

## 2020-10-30 DIAGNOSIS — Z3A18 18 weeks gestation of pregnancy: Secondary | ICD-10-CM | POA: Insufficient documentation

## 2020-10-30 DIAGNOSIS — O99212 Obesity complicating pregnancy, second trimester: Secondary | ICD-10-CM

## 2020-10-30 DIAGNOSIS — O99211 Obesity complicating pregnancy, first trimester: Secondary | ICD-10-CM

## 2020-10-30 DIAGNOSIS — Z7982 Long term (current) use of aspirin: Secondary | ICD-10-CM | POA: Diagnosis not present

## 2020-10-30 DIAGNOSIS — Z8759 Personal history of other complications of pregnancy, childbirth and the puerperium: Secondary | ICD-10-CM | POA: Diagnosis not present

## 2020-10-30 DIAGNOSIS — E669 Obesity, unspecified: Secondary | ICD-10-CM | POA: Diagnosis not present

## 2020-11-05 ENCOUNTER — Other Ambulatory Visit: Payer: Self-pay

## 2020-11-05 ENCOUNTER — Encounter: Payer: Self-pay | Admitting: Obstetrics and Gynecology

## 2020-11-05 ENCOUNTER — Encounter: Payer: Medicaid Other | Admitting: Obstetrics & Gynecology

## 2020-11-05 ENCOUNTER — Ambulatory Visit (INDEPENDENT_AMBULATORY_CARE_PROVIDER_SITE_OTHER): Payer: Medicaid Other

## 2020-11-05 ENCOUNTER — Ambulatory Visit (INDEPENDENT_AMBULATORY_CARE_PROVIDER_SITE_OTHER): Payer: Medicaid Other | Admitting: Obstetrics and Gynecology

## 2020-11-05 DIAGNOSIS — N909 Noninflammatory disorder of vulva and perineum, unspecified: Secondary | ICD-10-CM | POA: Diagnosis not present

## 2020-11-05 DIAGNOSIS — O0992 Supervision of high risk pregnancy, unspecified, second trimester: Secondary | ICD-10-CM

## 2020-11-05 DIAGNOSIS — E669 Obesity, unspecified: Secondary | ICD-10-CM | POA: Diagnosis not present

## 2020-11-05 DIAGNOSIS — Z3A19 19 weeks gestation of pregnancy: Secondary | ICD-10-CM

## 2020-11-05 DIAGNOSIS — O99212 Obesity complicating pregnancy, second trimester: Secondary | ICD-10-CM

## 2020-11-05 DIAGNOSIS — R300 Dysuria: Secondary | ICD-10-CM

## 2020-11-05 DIAGNOSIS — R3989 Other symptoms and signs involving the genitourinary system: Secondary | ICD-10-CM

## 2020-11-05 DIAGNOSIS — O99810 Abnormal glucose complicating pregnancy: Secondary | ICD-10-CM

## 2020-11-05 DIAGNOSIS — O99211 Obesity complicating pregnancy, first trimester: Secondary | ICD-10-CM

## 2020-11-05 DIAGNOSIS — L292 Pruritus vulvae: Secondary | ICD-10-CM | POA: Diagnosis not present

## 2020-11-05 DIAGNOSIS — Z8759 Personal history of other complications of pregnancy, childbirth and the puerperium: Secondary | ICD-10-CM

## 2020-11-05 DIAGNOSIS — O0991 Supervision of high risk pregnancy, unspecified, first trimester: Secondary | ICD-10-CM

## 2020-11-05 DIAGNOSIS — O26892 Other specified pregnancy related conditions, second trimester: Secondary | ICD-10-CM | POA: Diagnosis not present

## 2020-11-05 DIAGNOSIS — Z8632 Personal history of gestational diabetes: Secondary | ICD-10-CM

## 2020-11-05 LAB — POCT URINALYSIS DIPSTICK
Appearance: ABNORMAL
Bilirubin, UA: NEGATIVE
Blood, UA: NEGATIVE
Glucose, UA: NEGATIVE
Ketones, UA: NEGATIVE
Nitrite, UA: NEGATIVE
Odor: NORMAL
Protein, UA: NEGATIVE
Spec Grav, UA: 1.01 (ref 1.010–1.025)
Urobilinogen, UA: 0.2 E.U./dL
pH, UA: 6 (ref 5.0–8.0)

## 2020-11-05 NOTE — Addendum Note (Signed)
Addended by: Thomasene Mohair D on: 11/05/2020 04:02 PM   Modules accepted: Orders

## 2020-11-05 NOTE — Progress Notes (Signed)
Patient present today with complaint of irritation/itching. Urinalysis performed per Dr. Edison Pace request. Results recorded. Culture drawn.

## 2020-11-05 NOTE — Progress Notes (Signed)
Routine Prenatal Care Visit- Virtual Visit  Subjective   Virtual Visit via Telephone Note  I connected with Sierra Jordan on 11/05/20 at  3:10 PM EST by telephone and verified that I am speaking with the correct person using two identifiers.   I discussed the limitations, risks, security and privacy concerns of performing an evaluation and management service by telephone and the availability of in person appointments. I also discussed with the patient that there may be a patient responsible charge related to this service. The patient expressed understanding and agreed to proceed.  The patient was in her care about 15 miles from my office I spoke with the patient from my  Office  The names of people involved in this encounter were: Sierra Jordan and Thomasene Mohair, MD.   Sierra Jordan is a 30 y.o. [phone] at [redacted]w[redacted]d being seen today for ongoing prenatal care.  She is currently monitored for the following issues for this high-risk pregnancy and has Supervision of high risk pregnancy in first trimester; Obesity affecting pregnancy in first trimester; Stillbirth with antepartum death; History of pregnancy induced hypertension; History of gestational diabetes; Nausea and vomiting during pregnancy; BMI 40.0-44.9, adult (HCC); Sickle cell trait (HCC); and Pregnancy with poor reproductive history in first trimester on their problem list.  ----------------------------------------------------------------------------------- Patient reports UTI symptoms.  She has irritation when she voids.  She notes itching when she voids and that is the only time. She denies dysuria, frequency, fevers, chills.  She denies new back pain.     She notes thin vaginal discharge.  She states the discharge is normal, just thicker.  Contractions: Not present. Vag. Bleeding: None.  Movement: Present. Denies leaking of fluid.  ----------------------------------------------------------------------------------- The following  portions of the patient's history were reviewed and updated as appropriate: allergies, current medications, past family history, past medical history, past social history, past surgical history and problem list. Problem list updated.   Objective  Last menstrual period 06/23/2020, unknown if currently breastfeeding. Pregravid weight 240 lb (108.9 kg) Total Weight Gain -1 lb 8 oz (-0.68 kg) Urinalysis:      Fetal Status:     Movement: Present     Physical Exam could not be performed. Because of the COVID-19 outbreak this visit was performed over the phone and not in person.   Assessment   29 y.o. [id number] at [redacted]w[redacted]d by  03/30/2021, by Last Menstrual Period presenting for routine prenatal visit  Plan   pregnancy 8 Problems (from 08/26/20 to present)    Problem Noted Resolved   History of pregnancy induced hypertension 08/26/2020 by Zipporah Plants, CNM No   History of gestational diabetes 08/26/2020 by Zipporah Plants, CNM No   Obesity affecting pregnancy in first trimester 11/02/2016 by Conard Novak, MD No   Overview Signed 08/26/2020  4:48 PM by Zipporah Plants, CNM    BMI >=40 [ ]  early 1h gtt -  [ ]  screen sleep apnea [ ]  u/s for dating [ ]   [ ]  nutritional goals [ ]  folic acid 1mg  [ ]  bASA (>12 weeks) [ ]  consider nutrition consult [ ]  consider maternal EKG 1st trimester [ ]  Growth u/s 28 [ ] , 32 [ ] , 36 weeks [ ]  [ ]  NST/AFI weekly 34+ weeks (34[] ,35[] ,36[] , 37[] , 38[] , 39[] , 40[] ) [ ]  IOL by 41 weeks (scheduled, prn [] )          Gestational age appropriate obstetric precautions including but not limited to vaginal bleeding, contractions, leaking of fluid  and fetal movement were reviewed in detail with the patient.     Follow Up Instructions: There was confusion about the location of the visit and the patient reported to the [place] office instead of [place]. The phone interview was conducted because the patient has no complaints about from urinary. She was evaluated very  recently with normal findings.   - Will get a 3 h gtt scheduled for as soon as possible. - she is following with MFM for ultrasounds. - UA for urinary symptoms.    I discussed the assessment and treatment plan with the patient. The patient was provided an opportunity to ask questions and all were answered. The patient agreed with the plan and demonstrated an understanding of the instructions.   The patient was advised to call back or seek an in-person evaluation if the symptoms worsen or if the condition fails to improve as anticipated.  I provided 15 minutes of non-face-to-face time during this encounter.  Return in about 4 weeks (around 12/03/2020) for Routine Prenatal Appointment.  Thomasene Mohair, MD  [facility], [facility] 11/05/2020 3:30 PM

## 2020-11-07 LAB — URINE CULTURE

## 2020-11-12 ENCOUNTER — Other Ambulatory Visit: Payer: Medicaid Other

## 2020-11-13 ENCOUNTER — Ambulatory Visit (INDEPENDENT_AMBULATORY_CARE_PROVIDER_SITE_OTHER): Payer: Medicaid Other | Admitting: Obstetrics & Gynecology

## 2020-11-13 ENCOUNTER — Encounter: Payer: Self-pay | Admitting: Obstetrics & Gynecology

## 2020-11-13 ENCOUNTER — Other Ambulatory Visit: Payer: Medicaid Other

## 2020-11-13 ENCOUNTER — Other Ambulatory Visit: Payer: Self-pay

## 2020-11-13 VITALS — BP 110/70 | Wt 244.0 lb

## 2020-11-13 DIAGNOSIS — O99212 Obesity complicating pregnancy, second trimester: Secondary | ICD-10-CM

## 2020-11-13 DIAGNOSIS — Z8759 Personal history of other complications of pregnancy, childbirth and the puerperium: Secondary | ICD-10-CM

## 2020-11-13 DIAGNOSIS — Z3A20 20 weeks gestation of pregnancy: Secondary | ICD-10-CM

## 2020-11-13 DIAGNOSIS — Z3A19 19 weeks gestation of pregnancy: Secondary | ICD-10-CM

## 2020-11-13 DIAGNOSIS — O0992 Supervision of high risk pregnancy, unspecified, second trimester: Secondary | ICD-10-CM

## 2020-11-13 DIAGNOSIS — O99810 Abnormal glucose complicating pregnancy: Secondary | ICD-10-CM

## 2020-11-13 LAB — POCT URINALYSIS DIPSTICK OB
Glucose, UA: NEGATIVE
POC,PROTEIN,UA: NEGATIVE

## 2020-11-13 NOTE — Addendum Note (Signed)
Addended by: Cornelius Moras D on: 11/13/2020 09:36 AM   Modules accepted: Orders

## 2020-11-13 NOTE — Progress Notes (Signed)
  Subjective  Fetal Movement? yes Contractions? no Leaking Fluid? no Vaginal Bleeding? no No nausea Improved GU sx's from last visit Objective  BP 110/70   Wt 244 lb (110.7 kg)   LMP 06/23/2020   BMI 43.22 kg/m  General: NAD Pumonary: no increased work of breathing Abdomen: gravid, non-tender Extremities: no edema Psychiatric: mood appropriate, affect full  Assessment  30 y.o. [id number] at [redacted]w[redacted]d by  03/30/2021, by Last Menstrual Period presenting for routine prenatal visit  Plan   Problem List Items Addressed This Visit    Supervision of high risk pregnancy in first trimester   Obesity affecting pregnancy in first trimester   [redacted] weeks gestation of pregnancy        -  PNV, [place] at MFM planned    -  Monitoring based on prior IUFD planned      Problem Noted Resolved   History of pregnancy induced hypertension    Baby ASA daily    Counseled on s/sx preeclampsia     History of gestational diabetes    3 hour GTT today     Obesity affecting pregnancy in first trimester     Overview    BMI >=40 [ ]  early 1h gtt -  [ ]  screen sleep apnea [ ]  u/s for dating [ ]   [ ]  nutritional goals [ ]  folic acid 1mg  [ ]  bASA (>12 weeks) [ ]  consider nutrition consult [ ]  consider maternal EKG 1st trimester [ ]  Growth u/s 28 [ ] , 32 [ ] , 36 weeks [ ]  [ ]  NST/AFI weekly 34+ weeks (34[] ,35[] ,36[] , 37[] , 38[] , 39[] , 40[] ) [ ]  IOL by 41 weeks (scheduled, prn [] )          , MD, Ob/Gyn, [facility] 11/13/2020  9:24 AM

## 2020-11-14 ENCOUNTER — Telehealth: Payer: Self-pay

## 2020-11-14 LAB — GESTATIONAL GLUCOSE TOLERANCE
Glucose, Fasting: 104 mg/dL — ABNORMAL HIGH (ref 65–94)
Glucose, GTT - 1 Hour: 279 mg/dL — ABNORMAL HIGH (ref 65–179)
Glucose, GTT - 2 Hour: 175 mg/dL — ABNORMAL HIGH (ref 65–154)
Glucose, GTT - 3 Hour: 147 mg/dL — ABNORMAL HIGH (ref 65–139)

## 2020-11-14 NOTE — Telephone Encounter (Signed)
Tried to call pt to let her know she failed her 3 hr gtt so she has GDM. Please let me know when she calls back.

## 2020-11-25 ENCOUNTER — Other Ambulatory Visit: Payer: Self-pay | Admitting: Obstetrics and Gynecology

## 2020-11-25 DIAGNOSIS — O99212 Obesity complicating pregnancy, second trimester: Secondary | ICD-10-CM

## 2020-11-27 ENCOUNTER — Ambulatory Visit: Payer: Medicaid Other | Attending: Obstetrics

## 2020-11-27 ENCOUNTER — Other Ambulatory Visit: Payer: Self-pay

## 2020-11-27 DIAGNOSIS — Z3A22 22 weeks gestation of pregnancy: Secondary | ICD-10-CM | POA: Diagnosis not present

## 2020-11-27 DIAGNOSIS — Z8759 Personal history of other complications of pregnancy, childbirth and the puerperium: Secondary | ICD-10-CM

## 2020-11-27 DIAGNOSIS — O4443 Low lying placenta NOS or without hemorrhage, third trimester: Secondary | ICD-10-CM | POA: Diagnosis not present

## 2020-11-27 DIAGNOSIS — O99211 Obesity complicating pregnancy, first trimester: Secondary | ICD-10-CM

## 2020-11-27 DIAGNOSIS — O99212 Obesity complicating pregnancy, second trimester: Secondary | ICD-10-CM | POA: Insufficient documentation

## 2020-11-27 DIAGNOSIS — O09292 Supervision of pregnancy with other poor reproductive or obstetric history, second trimester: Secondary | ICD-10-CM

## 2020-11-27 DIAGNOSIS — O09212 Supervision of pregnancy with history of pre-term labor, second trimester: Secondary | ICD-10-CM | POA: Diagnosis not present

## 2020-11-27 DIAGNOSIS — Z8632 Personal history of gestational diabetes: Secondary | ICD-10-CM

## 2020-12-04 ENCOUNTER — Encounter: Payer: Medicaid Other | Admitting: Obstetrics and Gynecology

## 2020-12-08 ENCOUNTER — Other Ambulatory Visit: Payer: Self-pay

## 2020-12-08 ENCOUNTER — Other Ambulatory Visit: Payer: Self-pay | Admitting: Obstetrics and Gynecology

## 2020-12-08 ENCOUNTER — Observation Stay
Admission: EM | Admit: 2020-12-08 | Discharge: 2020-12-08 | Disposition: A | Discharge status: Home / Self Care | Payer: Medicaid Other | Attending: Obstetrics and Gynecology | Admitting: Obstetrics and Gynecology

## 2020-12-08 ENCOUNTER — Telehealth: Payer: Self-pay

## 2020-12-08 DIAGNOSIS — I10 Essential (primary) hypertension: Secondary | ICD-10-CM | POA: Insufficient documentation

## 2020-12-08 DIAGNOSIS — Z3A24 24 weeks gestation of pregnancy: Secondary | ICD-10-CM

## 2020-12-08 DIAGNOSIS — O99211 Obesity complicating pregnancy, first trimester: Secondary | ICD-10-CM

## 2020-12-08 DIAGNOSIS — O9A212 Injury, poisoning and certain other consequences of external causes complicating pregnancy, second trimester: Principal | ICD-10-CM | POA: Insufficient documentation

## 2020-12-08 DIAGNOSIS — M79601 Pain in right arm: Secondary | ICD-10-CM | POA: Insufficient documentation

## 2020-12-08 DIAGNOSIS — Z041 Encounter for examination and observation following transport accident: Secondary | ICD-10-CM | POA: Diagnosis not present

## 2020-12-08 DIAGNOSIS — M25512 Pain in left shoulder: Secondary | ICD-10-CM | POA: Insufficient documentation

## 2020-12-08 DIAGNOSIS — M79602 Pain in left arm: Secondary | ICD-10-CM | POA: Insufficient documentation

## 2020-12-08 DIAGNOSIS — O24419 Gestational diabetes mellitus in pregnancy, unspecified control: Secondary | ICD-10-CM | POA: Insufficient documentation

## 2020-12-08 DIAGNOSIS — O99332 Smoking (tobacco) complicating pregnancy, second trimester: Secondary | ICD-10-CM | POA: Diagnosis not present

## 2020-12-08 DIAGNOSIS — Y92828 Other wilderness area as the place of occurrence of the external cause: Secondary | ICD-10-CM | POA: Diagnosis not present

## 2020-12-08 DIAGNOSIS — F17210 Nicotine dependence, cigarettes, uncomplicated: Secondary | ICD-10-CM | POA: Insufficient documentation

## 2020-12-08 DIAGNOSIS — O24410 Gestational diabetes mellitus in pregnancy, diet controlled: Secondary | ICD-10-CM

## 2020-12-08 DIAGNOSIS — Z7982 Long term (current) use of aspirin: Secondary | ICD-10-CM | POA: Diagnosis not present

## 2020-12-08 DIAGNOSIS — O24414 Gestational diabetes mellitus in pregnancy, insulin controlled: Secondary | ICD-10-CM | POA: Diagnosis present

## 2020-12-08 DIAGNOSIS — Z8632 Personal history of gestational diabetes: Secondary | ICD-10-CM

## 2020-12-08 DIAGNOSIS — Z8759 Personal history of other complications of pregnancy, childbirth and the puerperium: Secondary | ICD-10-CM

## 2020-12-08 DIAGNOSIS — Z3490 Encounter for supervision of normal pregnancy, unspecified, unspecified trimester: Secondary | ICD-10-CM

## 2020-12-08 LAB — GLUCOSE, CAPILLARY: Glucose-Capillary: 81 mg/dL (ref 70–99)

## 2020-12-08 MED ORDER — ACCU-CHEK GUIDE VI STRP: ORAL_STRIP | 12 refills | Status: DC

## 2020-12-08 MED ORDER — ACCU-CHEK FASTCLIX LANCET KIT: 1.0000 | PACK | Freq: Four times a day (QID) | 12 refills | Status: DC

## 2020-12-08 MED ORDER — ACCU-CHEK GUIDE W/DEVICE KIT: 1.0000 | PACK | Freq: Four times a day (QID) | 0 refills | Status: DC
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
## 2020-12-08 NOTE — Discharge Summary (Signed)
  Please see final progress note. 
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
## 2020-12-12 ENCOUNTER — Other Ambulatory Visit: Payer: Self-pay

## 2020-12-12 ENCOUNTER — Encounter: Payer: Medicaid Other | Attending: Obstetrics and Gynecology | Admitting: *Deleted

## 2020-12-12 ENCOUNTER — Encounter: Payer: Self-pay | Admitting: *Deleted

## 2020-12-12 VITALS — BP 120/78 | Ht 62.0 in | Wt 252.0 lb

## 2020-12-12 DIAGNOSIS — Z3A00 Weeks of gestation of pregnancy not specified: Secondary | ICD-10-CM | POA: Insufficient documentation

## 2020-12-12 DIAGNOSIS — O24410 Gestational diabetes mellitus in pregnancy, diet controlled: Secondary | ICD-10-CM | POA: Diagnosis not present

## 2020-12-12 NOTE — Progress Notes (Signed)
Diabetes Self-Management Education  Visit Type: First/Initial  Appt. Start Time: 0845 Appt. End Time: 0955  12/12/2020  [name], identified by name and date of birth, is a 30 y.o. female with a diagnosis of Diabetes: Gestational Diabetes.   ASSESSMENT  Blood pressure 120/78, height 5\' 2"  (1.575 m), weight 252 lb (114.3 kg), last menstrual period 06/23/2020, estimated date of delivery 03/30/2021 Body mass index is 46.09 kg/m.   Diabetes Self-Management Education - 12/12/20 1021      Visit Information   Visit Type First/Initial      Initial Visit   Diabetes Type Gestational Diabetes    Are you currently following a meal plan? No    Are you taking your medications as prescribed? Yes    Date Diagnosed this week - pt had GDM in 2018      Health Coping   How would you rate your overall health? Fair      Psychosocial Assessment   Patient Belief/Attitude about Diabetes Other (comment)   "expected"   Self-care barriers None    Self-management support Doctor's office;Family    Patient Concerns Nutrition/Meal planning;Glycemic Control;Monitoring    Special Needs None    Preferred Learning Style Visual;Hands on    Learning Readiness Ready    How often do you need to have someone help you when you read instructions, pamphlets, or other written materials from your doctor or pharmacy? 1 - Never    What is the last grade level you completed in school? some college      Pre-Education Assessment   Patient understands the diabetes disease and treatment process. Needs Review    Patient understands incorporating nutritional management into lifestyle. Needs Instruction    Patient undertands incorporating physical activity into lifestyle. Needs Review    Patient understands using medications safely. Needs Instruction    Patient understands monitoring blood glucose, interpreting and using results Needs Review    Patient understands prevention, detection, and treatment of acute  complications. Needs Instruction    Patient understands prevention, detection, and treatment of chronic complications. Needs Instruction    Patient understands how to develop strategies to address psychosocial issues. Needs Instruction    Patient understands how to develop strategies to promote health/change behavior. Needs Instruction      Complications   How often do you check your blood sugar? 0 times/day (not testing)   Provided Accu-Chek Guide Me meter and instructed on use. BG upon return demonstration was 139 mg/dL at 2019 am - 1 1/2 hrs after eating a banana for breakfast   Have you had a dilated eye exam in the past 12 months? No    Have you had a dental exam in the past 12 months? No    Are you checking your feet? No      Dietary Intake   Breakfast cereal and milk 2-3 x week; fruit (strawberries, bananas, apples); sausage biscuit    Snack (morning) 3 snacks/day ar "different times" - fruit, chips, yogurt    Lunch sub, burger, fries    Dinner beef, chicken, pork, fish; potatoes, peas, beans, corn, rice, pasta, green beans, lettuce, tomatoes, carrots, cuccumbers, collards    Beverage(s) water, soda, juice      Exercise   Exercise Type Light (walking / raking leaves)    How many days per week to you exercise? 7    How many minutes per day do you exercise? 25    Total minutes per week of exercise 175  Patient Education   Previous Diabetes Education Yes (please comment)   Pt had Gestational diabetes in 2018 and came here for education.   Disease state  Definition of diabetes, type 1 and 2, and the diagnosis of diabetes;Factors that contribute to the development of diabetes    Nutrition management  Role of diet in the treatment of diabetes and the relationship between the three main macronutrients and blood glucose level;Food label reading, portion sizes and measuring food.;Reviewed blood glucose goals for pre and post meals and how to evaluate the patients' food intake on their  blood glucose level.    Physical activity and exercise  Role of exercise on diabetes management, blood pressure control and cardiac health.    Medications Other (comment)   Limited use of oral medications during pregnancy and potential for insulin.   Monitoring Taught/evaluated SMBG meter.;Purpose and frequency of SMBG.;Taught/discussed recording of test results and interpretation of SMBG.;Identified appropriate SMBG and/or A1C goals.;Ketone testing, when, how.    Chronic complications Relationship between chronic complications and blood glucose control    Psychosocial adjustment Identified and addressed patients feelings and concerns about diabetes    Preconception care Pregnancy and GDM  Role of pre-pregnancy blood glucose control on the development of the fetus;Reviewed with patient blood glucose goals with pregnancy;Role of family planning for patients with diabetes      Individualized Goals (developed by patient)   Reducing Risk Other (comment)   improve blood sugars, prevent diabetes complications     Outcomes   Expected Outcomes Demonstrated interest in learning. Expect positive outcomes    Future DMSE 2 wks           Individualized Plan for Diabetes Self-Management Training:   Learning Objective:  Patient will have a greater understanding of diabetes self-management. Patient education plan is to attend individual and/or group sessions per assessed needs and concerns.   Plan:   Patient Instructions  Read booklet on Gestational Diabetes Follow Gestational Meal Planning Guidelines Limit fried foods Avoid fruit for breakfast and limit cold cereal for breakfast Avoid sugar sweetened drinks (juice, soda) Complete a 3 Day Food Record and bring to next appointment Check blood sugars 4 x day - before breakfast and 2 hrs after every meal and record  Bring blood sugar log to all appointments Purchase urine ketone strips if instructed by MD and check urine ketones every am:  If +  increase bedtime snack to 1 protein and 2 carbohydrate servings Walk 20-30 minutes at least 5 x week if permitted by MD  Expected Outcomes:  Demonstrated interest in learning. Expect positive outcomes  Education material provided:  Gestational Booklet Gestational Meal Planning Guidelines Simple Meal Plan Viewed Gestational Diabetes Video Meter = Accu-Chek Guide Me 3 Day Food Record Goals for a Healthy Pregnancy  If problems or questions, patient to contact team via: Sharion Settler, RN, CCM, CDCES [phone]  Future DSME appointment: 2 wks  December 24, 2020 with the dietitian

## 2020-12-12 NOTE — Patient Instructions (Signed)
Read booklet on Gestational Diabetes Follow Gestational Meal Planning Guidelines Limit fried foods Avoid fruit for breakfast and limit cold cereal for breakfast Avoid sugar sweetened drinks (juice, soda) Complete a 3 Day Food Record and bring to next appointment Check blood sugars 4 x day - before breakfast and 2 hrs after every meal and record  Bring blood sugar log to all appointments Purchase urine ketone strips if instructed by MD and check urine ketones every am:  If + increase bedtime snack to 1 protein and 2 carbohydrate servings Walk 20-30 minutes at least 5 x week if permitted by MD

## 2020-12-15 ENCOUNTER — Other Ambulatory Visit: Payer: Self-pay | Admitting: Obstetrics and Gynecology

## 2020-12-15 DIAGNOSIS — O09299 Supervision of pregnancy with other poor reproductive or obstetric history, unspecified trimester: Secondary | ICD-10-CM

## 2020-12-15 DIAGNOSIS — O99213 Obesity complicating pregnancy, third trimester: Secondary | ICD-10-CM

## 2020-12-17 ENCOUNTER — Encounter: Payer: Medicaid Other | Admitting: Obstetrics and Gynecology

## 2020-12-24 ENCOUNTER — Ambulatory Visit: Payer: Medicaid Other | Admitting: Dietician

## 2020-12-25 ENCOUNTER — Ambulatory Visit: Payer: Medicaid Other | Attending: Obstetrics

## 2020-12-25 ENCOUNTER — Other Ambulatory Visit: Payer: Self-pay

## 2020-12-25 DIAGNOSIS — Z8759 Personal history of other complications of pregnancy, childbirth and the puerperium: Secondary | ICD-10-CM

## 2020-12-25 DIAGNOSIS — Z3A26 26 weeks gestation of pregnancy: Secondary | ICD-10-CM | POA: Diagnosis not present

## 2020-12-25 DIAGNOSIS — O24410 Gestational diabetes mellitus in pregnancy, diet controlled: Secondary | ICD-10-CM | POA: Insufficient documentation

## 2020-12-25 DIAGNOSIS — O09292 Supervision of pregnancy with other poor reproductive or obstetric history, second trimester: Secondary | ICD-10-CM

## 2020-12-25 DIAGNOSIS — O99212 Obesity complicating pregnancy, second trimester: Secondary | ICD-10-CM | POA: Diagnosis present

## 2020-12-25 DIAGNOSIS — Z8632 Personal history of gestational diabetes: Secondary | ICD-10-CM

## 2020-12-25 DIAGNOSIS — O99213 Obesity complicating pregnancy, third trimester: Secondary | ICD-10-CM

## 2020-12-25 DIAGNOSIS — O09299 Supervision of pregnancy with other poor reproductive or obstetric history, unspecified trimester: Secondary | ICD-10-CM

## 2020-12-25 DIAGNOSIS — O99211 Obesity complicating pregnancy, first trimester: Secondary | ICD-10-CM

## 2021-01-02 ENCOUNTER — Encounter: Payer: Self-pay | Admitting: Obstetrics

## 2021-01-02 ENCOUNTER — Telehealth: Payer: Self-pay

## 2021-01-02 ENCOUNTER — Other Ambulatory Visit: Payer: Self-pay

## 2021-01-02 ENCOUNTER — Observation Stay
Admission: EM | Admit: 2021-01-02 | Discharge: 2021-01-02 | Disposition: A | Discharge status: Home / Self Care | Payer: Medicaid Other | Attending: Obstetrics and Gynecology | Admitting: Obstetrics and Gynecology

## 2021-01-02 DIAGNOSIS — Z3A27 27 weeks gestation of pregnancy: Secondary | ICD-10-CM | POA: Insufficient documentation

## 2021-01-02 DIAGNOSIS — O26892 Other specified pregnancy related conditions, second trimester: Secondary | ICD-10-CM | POA: Diagnosis present

## 2021-01-02 DIAGNOSIS — O24414 Gestational diabetes mellitus in pregnancy, insulin controlled: Secondary | ICD-10-CM

## 2021-01-02 DIAGNOSIS — O09292 Supervision of pregnancy with other poor reproductive or obstetric history, second trimester: Secondary | ICD-10-CM

## 2021-01-02 DIAGNOSIS — M549 Dorsalgia, unspecified: Secondary | ICD-10-CM | POA: Diagnosis not present

## 2021-01-02 DIAGNOSIS — O99891 Other specified diseases and conditions complicating pregnancy: Secondary | ICD-10-CM

## 2021-01-02 LAB — URINALYSIS, ROUTINE W REFLEX MICROSCOPIC
Bilirubin Urine: NEGATIVE
Glucose, UA: NEGATIVE mg/dL
Hgb urine dipstick: NEGATIVE
Ketones, ur: 5 mg/dL — AB
Leukocytes,Ua: NEGATIVE
Nitrite: NEGATIVE
Protein, ur: NEGATIVE mg/dL
Specific Gravity, Urine: 1.025 (ref 1.005–1.030)
pH: 6 (ref 5.0–8.0)

## 2021-01-02 LAB — GLUCOSE, CAPILLARY: Glucose-Capillary: 125 mg/dL — ABNORMAL HIGH (ref 70–99)

## 2021-01-02 MED ORDER — INSULIN PEN NEEDLE 32G X 6 MM MISC
1.0000 [IU] | Freq: Every day | 0 refills | Status: DC
Start: 2021-01-02 — End: 2021-02-19

## 2021-01-02 MED ORDER — INSULIN GLARGINE 100 UNIT/ML SOLOSTAR PEN: 10.0000 [IU] | PEN_INJECTOR | Freq: Every day | SUBCUTANEOUS | 2 refills | Status: DC

## 2021-01-02 NOTE — Telephone Encounter (Signed)
Spoke w/patient. She has received my voicemail and is about to head to the hospital for evaluation now.

## 2021-01-02 NOTE — Telephone Encounter (Signed)
Spoke w/patient. She denies any abdominal cramping/stomach tightening. Reports pain is mid lower back. Denies sciatica/pain running down leg. Able to relieve when lying on back only, but knows she isn't suppose to for long periods. Has not tried tylenol/heat or other positions. Her concern is she had a placental abruption in previous pregnancy and it started with back pain. Offered am apt. She doesn't have a ride til pm. Apt scheduled for 4:30 w/MMF. Patient will report to ED if anything changes prior to apt.

## 2021-01-02 NOTE — Telephone Encounter (Signed)
LMVM to notify patient to report to L&D thru ED for evaluation. Also sending my chart message.

## 2021-01-02 NOTE — Progress Notes (Signed)
Pt states "she needs reassure that everything is okay" "this reminds me of the way it felt when I had my stillborn".

## 2021-01-02 NOTE — Discharge Summary (Signed)
Physician Final Progress Note  Patient ID: Sierra Jordan MRN: [id number] DOB/AGE: August 19, 1991 30 y.o.  Admit date: 01/02/2021 Admitting provider: Malachy Mood, MD Discharge date: 01/02/2021   Admission Diagnoses: Back pain   Discharge Diagnoses:  Active Problems:   Back pain affecting pregnancy in third trimester  30 y.o. [id number] at [id number] Estimated Date of Delivery: 03/30/21 presenting for back pain.  History of severe pre-eclampsia and prior placental abruption.  On presentation normotensive.  No evidence of contractions.  UA negative.  Reassuring fetal surveillance.  Growth scan 12/25/2020 1lbs 16oz(868g) c/w 20%ile.  BG on presentation 125.  Missed appointment last week but reports fasting sugars in 110-120, post prandials 120-140.  Discussed importance of management of hyperglycemia in pregnancy risk to herself and fetus.  Discussed options of inpatient monitoring of BG and determining insulin dose, starting metformin or insulin and following up next week, or simply following up next week to review BG log.  Patient amenable to starting insulin with start Lantus 10 units qHS.    +FM, no LOF, no VB, no ctx.  Blood pressure 125/71, temperature 98.3 F (36.8 C), temperature source Oral, resp. rate 18, last menstrual period 06/23/2020, unknown if currently breastfeeding.   Consults: None  Significant Findings/ Diagnostic Studies: { Results for orders placed or performed during the hospital encounter of 01/02/21 (from the past 24 hour(s))  Glucose, capillary     Status: Abnormal   Collection Time: 01/02/21  5:42 PM  Result Value Ref Range   Glucose-Capillary 125 (H) 70 - 99 mg/dL  Urinalysis, Routine w reflex microscopic Urine, Clean Catch     Status: Abnormal   Collection Time: 01/02/21  5:47 PM  Result Value Ref Range   Color, Urine YELLOW (A) YELLOW   APPearance CLEAR (A) CLEAR   Specific Gravity, Urine 1.025 1.005 - 1.030   pH 6.0 5.0 - 8.0   Glucose, UA NEGATIVE NEGATIVE  mg/dL   Hgb urine dipstick NEGATIVE NEGATIVE   Bilirubin Urine NEGATIVE NEGATIVE   Ketones, ur 5 (A) NEGATIVE mg/dL   Protein, ur NEGATIVE NEGATIVE mg/dL   Nitrite NEGATIVE NEGATIVE   Leukocytes,Ua NEGATIVE NEGATIVE     Procedures:  Baseline: 140 Variability: moderate Accelerations: present 10x10 Decelerations: absent Tocometry: none The patient was monitored for 30 minutes, fetal heart rate tracing was deemed reactive, category I tracing,  Discharge Condition: good  Disposition: Discharge disposition: 01-Home or Self Care       Diet: Regular diet  Discharge Activity: Activity as tolerated  Discharge Instructions    Discharge activity:  No Restrictions   Complete by: As directed    Discharge diet:  No restrictions   Complete by: As directed    No sexual activity restrictions   Complete by: As directed    Notify physician for a general feeling that "something is not right"   Complete by: As directed    Notify physician for increase or change in vaginal discharge   Complete by: As directed    Notify physician for intestinal cramps, with or without diarrhea, sometimes described as "gas pain"   Complete by: As directed    Notify physician for leaking of fluid   Complete by: As directed    Notify physician for low, dull backache, unrelieved by heat or Tylenol   Complete by: As directed    Notify physician for menstrual like cramps   Complete by: As directed    Notify physician for pelvic pressure   Complete by: As directed  Notify physician for uterine contractions.  These may be painless and feel like the uterus is tightening or the baby is  "balling up"   Complete by: As directed    Notify physician for vaginal bleeding   Complete by: As directed    PRETERM LABOR:  Includes any of the follwing symptoms that occur between 20 - [redacted] weeks gestation.  If these symptoms are not stopped, preterm labor can result in preterm delivery, placing your baby at risk   Complete  by: As directed      Allergies as of 01/02/2021   No Known Allergies     Medication List    TAKE these medications   Accu-Chek FastClix Lancet Kit 1 Device by Does not apply route 4 (four) times daily.   Accu-Chek FastClix Lancets Misc Apply topically.   Accu-Chek Guide test strip Generic drug: glucose blood Use as instructed   Accu-Chek Guide w/Device Kit 1 Device by Does not apply route in the morning, at noon, in the evening, and at bedtime.   aspirin 81 MG chewable tablet Chew 81 mg by mouth daily.   ferrous sulfate 325 (65 FE) MG tablet Take 1 tablet (325 mg total) by mouth 2 (two) times daily with a meal. What changed: when to take this   insulin glargine 100 UNIT/ML Solostar Pen Commonly known as: LANTUS Inject 10 Units into the skin at bedtime.   Insulin Pen Needle 32G X 6 MM Misc 1 Units by Does not apply route daily.   prenatal multivitamin Tabs tablet Take 1 tablet by mouth daily at 12 noon.        Total time spent taking care of this patient: 45 minutes  Signed: Malachy Mood 01/02/2021, 7:09 PM

## 2021-01-02 NOTE — Telephone Encounter (Signed)
27 wk OB having dull cramps in lower back, coming and going, not getting stronger or shorter. Concerned d/t high risk pregnancy. Cb#[phone]

## 2021-01-02 NOTE — OB Triage Note (Signed)
Pt c/o lower back cramping that started yesterday. 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 She says that laying on her back does help and feels better. She denies any vaginal bleeding or LOF, and reports baby movement often. 8/10 on pain scale for cramping

## 2021-01-05 ENCOUNTER — Telehealth: Payer: Self-pay

## 2021-01-05 NOTE — Telephone Encounter (Signed)
-----   Message from Vena Austria, MD sent at 01/02/2021  7:15 PM EDT ----- Regarding: MD follow up Needs MD follow up sometime next week for BG log review and insulin management   Vena Austria, MD, [facility], [facility] 01/02/2021, 7:15 PM

## 2021-01-05 NOTE — Telephone Encounter (Signed)
Called and left voicemail for patient to call back to be scheduled. 

## 2021-01-06 NOTE — Telephone Encounter (Signed)
Voicemail box is full - unable to leave message

## 2021-01-07 ENCOUNTER — Ambulatory Visit (INDEPENDENT_AMBULATORY_CARE_PROVIDER_SITE_OTHER): Payer: Medicaid Other | Admitting: Obstetrics and Gynecology

## 2021-01-07 ENCOUNTER — Other Ambulatory Visit: Payer: Self-pay

## 2021-01-07 DIAGNOSIS — Z91199 Patient's noncompliance with other medical treatment and regimen due to unspecified reason: Secondary | ICD-10-CM

## 2021-01-07 DIAGNOSIS — Z5329 Procedure and treatment not carried out because of patient's decision for other reasons: Secondary | ICD-10-CM

## 2021-01-07 NOTE — Progress Notes (Signed)
Attempted to reach patient no answer, mailbox is full and unable to accept message at this time.   Vena Austria, MD, [facility], [facility] 01/07/2021, 10:59 AM    Repeat attempt still no answer. Attempted work phone number listed and was able to leave a message.     Vena Austria, MD, [facility], [facility] 01/07/2021, 11:15 AM

## 2021-01-08 ENCOUNTER — Other Ambulatory Visit: Payer: Self-pay | Admitting: Obstetrics and Gynecology

## 2021-01-08 DIAGNOSIS — O09299 Supervision of pregnancy with other poor reproductive or obstetric history, unspecified trimester: Secondary | ICD-10-CM

## 2021-01-08 DIAGNOSIS — O99213 Obesity complicating pregnancy, third trimester: Secondary | ICD-10-CM

## 2021-01-19 ENCOUNTER — Encounter: Payer: Self-pay | Admitting: Dietician

## 2021-01-19 NOTE — Progress Notes (Signed)
Have not heard back from patient to reschedule her missed appointment from 12/24/20. Sent notification to referring provider.

## 2021-01-20 ENCOUNTER — Other Ambulatory Visit: Payer: Self-pay

## 2021-01-20 ENCOUNTER — Ambulatory Visit: Payer: Medicaid Other | Attending: Maternal & Fetal Medicine

## 2021-01-20 DIAGNOSIS — O09299 Supervision of pregnancy with other poor reproductive or obstetric history, unspecified trimester: Secondary | ICD-10-CM | POA: Diagnosis present

## 2021-01-20 DIAGNOSIS — O99213 Obesity complicating pregnancy, third trimester: Secondary | ICD-10-CM | POA: Diagnosis not present

## 2021-01-20 DIAGNOSIS — O09293 Supervision of pregnancy with other poor reproductive or obstetric history, third trimester: Secondary | ICD-10-CM | POA: Diagnosis not present

## 2021-01-20 DIAGNOSIS — O24414 Gestational diabetes mellitus in pregnancy, insulin controlled: Secondary | ICD-10-CM | POA: Insufficient documentation

## 2021-01-20 DIAGNOSIS — Z3A30 30 weeks gestation of pregnancy: Secondary | ICD-10-CM | POA: Insufficient documentation

## 2021-01-20 NOTE — Telephone Encounter (Signed)
LMVM TRC. Advised to ask for Elka Satterfield 

## 2021-01-20 NOTE — Telephone Encounter (Signed)
Spoke w/patient. Verified she will keep her MFM apt today. Notified [facility] has been trying to reach her to r/s. Patient given #to contact them to r/s missed appointment. She did not have the 4/13 telephone visit. She was already at work and unable to take the call. Appointment scheduled for 3:10 w/SDJ tomorrow in [place] (only appointment available with MD this week that she can make).Notified to bring glucose log. Aware of location [facility] [place] 2nd floor [address].

## 2021-01-21 ENCOUNTER — Observation Stay
Admission: EM | Admit: 2021-01-21 | Discharge: 2021-01-21 | Disposition: A | Discharge status: Home / Self Care | Payer: Medicaid Other | Attending: Obstetrics and Gynecology | Admitting: Obstetrics and Gynecology

## 2021-01-21 ENCOUNTER — Ambulatory Visit (INDEPENDENT_AMBULATORY_CARE_PROVIDER_SITE_OTHER): Payer: Medicaid Other | Admitting: Obstetrics and Gynecology

## 2021-01-21 ENCOUNTER — Other Ambulatory Visit: Payer: Self-pay

## 2021-01-21 ENCOUNTER — Encounter: Payer: Self-pay | Admitting: Obstetrics and Gynecology

## 2021-01-21 VITALS — BP 140/100 | HR 69 | Ht 62.0 in | Wt 262.6 lb

## 2021-01-21 DIAGNOSIS — O133 Gestational [pregnancy-induced] hypertension without significant proteinuria, third trimester: Principal | ICD-10-CM | POA: Insufficient documentation

## 2021-01-21 DIAGNOSIS — Z7982 Long term (current) use of aspirin: Secondary | ICD-10-CM | POA: Diagnosis not present

## 2021-01-21 DIAGNOSIS — O0993 Supervision of high risk pregnancy, unspecified, third trimester: Secondary | ICD-10-CM

## 2021-01-21 DIAGNOSIS — Z3A30 30 weeks gestation of pregnancy: Secondary | ICD-10-CM

## 2021-01-21 DIAGNOSIS — Z87891 Personal history of nicotine dependence: Secondary | ICD-10-CM | POA: Insufficient documentation

## 2021-01-21 DIAGNOSIS — O24414 Gestational diabetes mellitus in pregnancy, insulin controlled: Secondary | ICD-10-CM

## 2021-01-21 DIAGNOSIS — O99213 Obesity complicating pregnancy, third trimester: Secondary | ICD-10-CM | POA: Diagnosis not present

## 2021-01-21 DIAGNOSIS — Z794 Long term (current) use of insulin: Secondary | ICD-10-CM | POA: Diagnosis not present

## 2021-01-21 DIAGNOSIS — E669 Obesity, unspecified: Secondary | ICD-10-CM | POA: Diagnosis not present

## 2021-01-21 DIAGNOSIS — Z113 Encounter for screening for infections with a predominantly sexual mode of transmission: Secondary | ICD-10-CM

## 2021-01-21 DIAGNOSIS — O163 Unspecified maternal hypertension, third trimester: Secondary | ICD-10-CM | POA: Diagnosis present

## 2021-01-21 LAB — POCT URINALYSIS DIPSTICK OB: Glucose, UA: NEGATIVE

## 2021-01-21 LAB — CBC
HCT: 28.9 % — ABNORMAL LOW (ref 36.0–46.0)
Hemoglobin: 10.2 g/dL — ABNORMAL LOW (ref 12.0–15.0)
MCH: 27.8 pg (ref 26.0–34.0)
MCHC: 35.3 g/dL (ref 30.0–36.0)
MCV: 78.7 fL — ABNORMAL LOW (ref 80.0–100.0)
Platelets: 315 10*3/uL (ref 150–400)
RBC: 3.67 MIL/uL — ABNORMAL LOW (ref 3.87–5.11)
RDW: 13 % (ref 11.5–15.5)
WBC: 8.1 10*3/uL (ref 4.0–10.5)
nRBC: 0 % (ref 0.0–0.2)

## 2021-01-21 LAB — COMPREHENSIVE METABOLIC PANEL
ALT: 19 U/L (ref 0–44)
AST: 16 U/L (ref 15–41)
Albumin: 2.7 g/dL — ABNORMAL LOW (ref 3.5–5.0)
Alkaline Phosphatase: 79 U/L (ref 38–126)
Anion gap: 7 (ref 5–15)
BUN: 11 mg/dL (ref 6–20)
CO2: 22 mmol/L (ref 22–32)
Calcium: 8.8 mg/dL — ABNORMAL LOW (ref 8.9–10.3)
Chloride: 107 mmol/L (ref 98–111)
Creatinine, Ser: 0.49 mg/dL (ref 0.44–1.00)
GFR, Estimated: >60 mL/min (ref >60)
Glucose, Bld: 75 mg/dL (ref 70–99)
Potassium: 3.4 mmol/L — ABNORMAL LOW (ref 3.5–5.1)
Sodium: 136 mmol/L (ref 135–145)
Total Bilirubin: 0.4 mg/dL (ref 0.3–1.2)
Total Protein: 6.4 g/dL — ABNORMAL LOW (ref 6.5–8.1)

## 2021-01-21 LAB — PROTEIN / CREATININE RATIO, URINE
Creatinine, Urine: 105 mg/dL
Protein Creatinine Ratio: 0.14 mg/mg{Cre} (ref 0.00–0.15)
Total Protein, Urine: 15 mg/dL

## 2021-01-21 LAB — GLUCOSE, CAPILLARY: Glucose-Capillary: 84 mg/dL (ref 70–99)

## 2021-01-21 MED ORDER — LABETALOL HCL 100 MG PO TABS
200.0000 mg | ORAL_TABLET | Freq: Once | ORAL | Status: AC
  Administered 2021-01-21: 200 mg via ORAL
  Filled 2021-01-21: qty 2

## 2021-01-21 MED ORDER — LABETALOL HCL 200 MG PO TABS: 200.0000 mg | ORAL_TABLET | Freq: Two times a day (BID) | ORAL | 1 refills | Status: DC
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
## 2021-01-21 NOTE — Progress Notes (Signed)
I offered to let pt wait and recheck her blood pressure after labetalol but pt states she is ready to go home and she can check her blood pressure with her home monitor.

## 2021-01-21 NOTE — OB Triage Note (Signed)
Pt sent from office for [facility] workup. BP Q15

## 2021-01-21 NOTE — Discharge Summary (Incomplete)
Physician Final Progress Note  Patient ID: Sierra Jordan MRN: [id number] DOB/AGE: 1991/07/15 30 y.o.  Admit date: 01/21/2021 Admitting provider: Rod Can, CNM Discharge date: 01/21/2021   Admission Diagnoses: elevated blood pressure at office visit  Discharge Diagnoses:  Active Problems:   Supervision of high-risk pregnancy, third trimester   Elevated blood pressure affecting pregnancy in third trimester, antepartum   [redacted] weeks gestation of pregnancy   History of Present Illness: The patient is a 30 y.o. female [phone] at 10w2dwho presents for pregnancy induced hypertension evaluation. Her blood pressure in the office was 140/100 and repeat was 143/96. She had no complaints at her visit. She arrived to L&D around 6:30 PM was placed on monitors, labs drawn. She denies any headache, visual changes or epigastric pain. She denies contractions, vaginal bleeding or  Past Medical History:  Diagnosis Date  . Family history of breast cancer    12/21 cancer genetic testing letter sent  . Gestational diabetes   . Gestational hypertension 11/02/2016  . Hyperemesis gravidarum with dehydration 04/22/2016  . Hypertension   . Medical history non-contributory   . Preeclampsia in postpartum period 01/19/2019  . Preeclampsia, severe 01/19/2019  . Stillbirth     Past Surgical History:  Procedure Laterality Date  . CESAREAN SECTION N/A 01/13/2019   Procedure: CESAREAN SECTION;  Surgeon: JWill Bonnet MD;  Location: [facility] ORS;  Service: Obstetrics;  Laterality: N/A;  . DILATION AND CURETTAGE OF UTERUS  2008, 2012   D&E EAB  . DILATION AND CURETTAGE OF UTERUS N/A 11/03/2016   Procedure: DILATATION AND CURETTAGE;  Surgeon: RGae Dry MD;  Location: [facility] ORS;  Service: Gynecology;  Laterality: N/A;    No current facility-administered medications on file prior to encounter.   Current Outpatient Medications on File Prior to Encounter  Medication Sig Dispense Refill  . Accu-Chek FastClix  Lancets MISC Apply topically.    .Marland Kitchenaspirin 81 MG chewable tablet Chew 81 mg by mouth daily.    . Blood Glucose Monitoring Suppl (ACCU-CHEK GUIDE) w/Device KIT 1 Device by Does not apply route in the morning, at noon, in the evening, and at bedtime. 1 kit 0  . ferrous sulfate 325 (65 FE) MG tablet Take 1 tablet (325 mg total) by mouth 2 (two) times daily with a meal. (Patient taking differently: Take 325 mg by mouth daily with breakfast.) 60 tablet 11  . glucose blood (ACCU-CHEK GUIDE) test strip Use as instructed 100 each 12  . insulin glargine (LANTUS) 100 UNIT/ML Solostar Pen Inject 10 Units into the skin at bedtime. 15 mL 2  . Insulin Pen Needle 32G X 6 MM MISC 1 Units by Does not apply route daily. 100 each 0  . Lancets Misc. (ACCU-CHEK FASTCLIX LANCET) KIT 1 Device by Does not apply route 4 (four) times daily. 120 kit 12  . Prenatal Vit-Fe Fumarate-FA (PRENATAL MULTIVITAMIN) TABS tablet Take 1 tablet by mouth daily at 12 noon. 60 tablet 6    No Known Allergies  Social History   Socioeconomic History  . Marital status: Single    Spouse name: Not on file  . Number of children: Not on file  . Years of education: Not on file  . Highest education level: Not on file  Occupational History  . Occupation: POccupational psychologist Tobacco Use  . Smoking status: Former Smoker    Packs/day: 0.10    Years: 2.00    Pack years: 0.20    Types: Cigarettes, Cigars  Quit date: 11/25/2020    Years since quitting: 0.1  . Smokeless tobacco: Never Used  . Tobacco comment: 2-3 puffs a day Black & Milds  Substance and Sexual Activity  . Alcohol use: No  . Drug use: Not Currently    Types: Marijuana    Comment: Stopped 11/21  . Sexual activity: Yes    Birth control/protection: None  Other Topics Concern  . Not on file  Social History Narrative  . Not on file   Social Determinants of Health   Financial Resource Strain: Not on file  Food Insecurity: Not on file  Transportation Needs: Not on file   Physical Activity: Not on file  Stress: Not on file  Social Connections: Not on file  Intimate Partner Violence: Not on file    Family History  Problem Relation Age of Onset  . Diabetes Mother   . Hyperlipidemia Mother   . Hypertension Mother   . Depression Mother   . Pancreatitis Mother   . Breast cancer Mother 69  . Diabetes Father   . Breast cancer Maternal Aunt   . Breast cancer Other 40     ROS   Physical Exam: BP (!) 147/82   Pulse (!) 58   Temp 98.2 F (36.8 C) (Oral)   Resp 14   Ht '5\' 2"'  (1.575 m)   Wt 119.1 kg   LMP 06/23/2020   BMI 48.02 kg/m   OBGyn Exam  Consults: {consultation:18241}  Significant Findings/ Diagnostic Studies: {diagnostics:18242}  Procedures: ***  Hospital Course: The patient was admitted to Labor and Delivery Triage for observation. ***  Discharge Condition: {condition:18240}  Disposition: Discharge disposition: 01-Home or Self Care       Diet: {CHL DISCHARGE DIET:21201}  Discharge Activity: {CHL DISCHARGE ACTIVITY ORDER:21200}  Discharge Instructions    Discharge activity:  No Restrictions   Complete by: As directed    Discharge diet:   Complete by: As directed    Carb modified diet   No sexual activity restrictions   Complete by: As directed    Notify physician for a general feeling that "something is not right"   Complete by: As directed    Notify physician for increase or change in vaginal discharge   Complete by: As directed    Notify physician for intestinal cramps, with or without diarrhea, sometimes described as "gas pain"   Complete by: As directed    Notify physician for leaking of fluid   Complete by: As directed    Notify physician for low, dull backache, unrelieved by heat or Tylenol   Complete by: As directed    Notify physician for menstrual like cramps   Complete by: As directed    Notify physician for pelvic pressure   Complete by: As directed    Notify physician for uterine contractions.   These may be painless and feel like the uterus is tightening or the baby is  "balling up"   Complete by: As directed    Notify physician for vaginal bleeding   Complete by: As directed    PRETERM LABOR:  Includes any of the follwing symptoms that occur between 20 - [redacted] weeks gestation.  If these symptoms are not stopped, preterm labor can result in preterm delivery, placing your baby at risk   Complete by: As directed      Allergies as of 01/21/2021   No Known Allergies     Medication List    TAKE these medications   Accu-Chek FastClix Lancet Kit  1 Device by Does not apply route 4 (four) times daily.   Accu-Chek FastClix Lancets Misc Apply topically.   Accu-Chek Guide test strip Generic drug: glucose blood Use as instructed   Accu-Chek Guide w/Device Kit 1 Device by Does not apply route in the morning, at noon, in the evening, and at bedtime.   aspirin 81 MG chewable tablet Chew 81 mg by mouth daily.   ferrous sulfate 325 (65 FE) MG tablet Take 1 tablet (325 mg total) by mouth 2 (two) times daily with a meal. What changed: when to take this   insulin glargine 100 UNIT/ML Solostar Pen Commonly known as: LANTUS Inject 10 Units into the skin at bedtime.   Insulin Pen Needle 32G X 6 MM Misc 1 Units by Does not apply route daily.   labetalol 200 MG tablet Commonly known as: NORMODYNE Take 1 tablet (200 mg total) by mouth 2 (two) times daily. Start taking on: January 22, 2021   prenatal multivitamin Tabs tablet Take 1 tablet by mouth daily at 12 noon.       [place]. Schedule an appointment as soon as possible for a visit in 2 day(s).   Specialty: Obstetrics and Gynecology Why: blood pressure check with MD Contact information: [address] [place] [address] [phone]              Total time spent taking care of this patient: *** minutes  Signed: Rod Can, CNM  01/21/2021, 11:54 PM

## 2021-01-21 NOTE — OB Triage Note (Signed)
Pt is a G8P1 at 30.2 weeks withincreased BP in the office. Pt states this is a new finding. Pt with hx of missed ABs and GDM with insulin coverage. Pt is a good historian. Pt reports +FM and no cramping. FHR 150
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
## 2021-01-23 ENCOUNTER — Encounter: Payer: Self-pay | Admitting: Obstetrics and Gynecology

## 2021-01-23 ENCOUNTER — Other Ambulatory Visit: Payer: Self-pay | Admitting: Obstetrics

## 2021-01-23 ENCOUNTER — Other Ambulatory Visit: Payer: Self-pay

## 2021-01-23 ENCOUNTER — Encounter: Payer: Self-pay | Admitting: Obstetrics

## 2021-01-23 ENCOUNTER — Observation Stay
Admission: EM | Admit: 2021-01-23 | Discharge: 2021-01-24 | Disposition: A | Discharge status: Home / Self Care | Payer: Medicaid Other | Attending: Obstetrics and Gynecology | Admitting: Obstetrics and Gynecology

## 2021-01-23 ENCOUNTER — Ambulatory Visit (INDEPENDENT_AMBULATORY_CARE_PROVIDER_SITE_OTHER): Payer: Medicaid Other | Admitting: Obstetrics and Gynecology

## 2021-01-23 VITALS — BP 140/90 | Wt 259.0 lb

## 2021-01-23 DIAGNOSIS — O24414 Gestational diabetes mellitus in pregnancy, insulin controlled: Secondary | ICD-10-CM

## 2021-01-23 DIAGNOSIS — O0993 Supervision of high risk pregnancy, unspecified, third trimester: Secondary | ICD-10-CM

## 2021-01-23 DIAGNOSIS — O1403 Mild to moderate pre-eclampsia, third trimester: Secondary | ICD-10-CM | POA: Diagnosis not present

## 2021-01-23 DIAGNOSIS — D649 Anemia, unspecified: Secondary | ICD-10-CM | POA: Diagnosis not present

## 2021-01-23 DIAGNOSIS — O99013 Anemia complicating pregnancy, third trimester: Secondary | ICD-10-CM | POA: Diagnosis not present

## 2021-01-23 DIAGNOSIS — Z79899 Other long term (current) drug therapy: Secondary | ICD-10-CM | POA: Insufficient documentation

## 2021-01-23 DIAGNOSIS — Z3A30 30 weeks gestation of pregnancy: Secondary | ICD-10-CM

## 2021-01-23 DIAGNOSIS — Z7982 Long term (current) use of aspirin: Secondary | ICD-10-CM | POA: Insufficient documentation

## 2021-01-23 DIAGNOSIS — O0940 Supervision of pregnancy with grand multiparity, unspecified trimester: Secondary | ICD-10-CM

## 2021-01-23 DIAGNOSIS — O133 Gestational [pregnancy-induced] hypertension without significant proteinuria, third trimester: Secondary | ICD-10-CM | POA: Diagnosis present

## 2021-01-23 DIAGNOSIS — Z8632 Personal history of gestational diabetes: Secondary | ICD-10-CM

## 2021-01-23 DIAGNOSIS — Z87891 Personal history of nicotine dependence: Secondary | ICD-10-CM | POA: Insufficient documentation

## 2021-01-23 DIAGNOSIS — O99211 Obesity complicating pregnancy, first trimester: Secondary | ICD-10-CM

## 2021-01-23 DIAGNOSIS — O99213 Obesity complicating pregnancy, third trimester: Secondary | ICD-10-CM

## 2021-01-23 DIAGNOSIS — Z794 Long term (current) use of insulin: Secondary | ICD-10-CM | POA: Diagnosis not present

## 2021-01-23 LAB — COMPREHENSIVE METABOLIC PANEL
ALT: 15 U/L (ref 0–44)
AST: 16 U/L (ref 15–41)
Albumin: 2.5 g/dL — ABNORMAL LOW (ref 3.5–5.0)
Alkaline Phosphatase: 76 U/L (ref 38–126)
Anion gap: 7 (ref 5–15)
BUN: 12 mg/dL (ref 6–20)
CO2: 21 mmol/L — ABNORMAL LOW (ref 22–32)
Calcium: 8.4 mg/dL — ABNORMAL LOW (ref 8.9–10.3)
Chloride: 108 mmol/L (ref 98–111)
Creatinine, Ser: 0.59 mg/dL (ref 0.44–1.00)
GFR, Estimated: >60 mL/min (ref >60)
Glucose, Bld: 101 mg/dL — ABNORMAL HIGH (ref 70–99)
Potassium: 3.6 mmol/L (ref 3.5–5.1)
Sodium: 136 mmol/L (ref 135–145)
Total Bilirubin: 0.5 mg/dL (ref 0.3–1.2)
Total Protein: 6.3 g/dL — ABNORMAL LOW (ref 6.5–8.1)

## 2021-01-23 LAB — POCT URINALYSIS DIPSTICK OB: Glucose, UA: NEGATIVE

## 2021-01-23 LAB — PROTEIN / CREATININE RATIO, URINE
Creatinine, Urine: 54 mg/dL
Protein Creatinine Ratio: 0.15 mg/mg{Cre} (ref 0.00–0.15)
Total Protein, Urine: 8 mg/dL

## 2021-01-23 LAB — CBC
HCT: 27.8 % — ABNORMAL LOW (ref 36.0–46.0)
Hemoglobin: 9.7 g/dL — ABNORMAL LOW (ref 12.0–15.0)
MCH: 27.7 pg (ref 26.0–34.0)
MCHC: 34.9 g/dL (ref 30.0–36.0)
MCV: 79.4 fL — ABNORMAL LOW (ref 80.0–100.0)
Platelets: 287 10*3/uL (ref 150–400)
RBC: 3.5 MIL/uL — ABNORMAL LOW (ref 3.87–5.11)
RDW: 13.2 % (ref 11.5–15.5)
WBC: 7.8 10*3/uL (ref 4.0–10.5)
nRBC: 0 % (ref 0.0–0.2)

## 2021-01-23 LAB — URIC ACID: Uric Acid, Serum: 5.6 mg/dL (ref 2.5–7.1)

## 2021-01-23 LAB — GLUCOSE, CAPILLARY
Glucose-Capillary: 107 mg/dL — ABNORMAL HIGH (ref 70–99)
Glucose-Capillary: 84 mg/dL (ref 70–99)

## 2021-01-23 MED ORDER — ACETAMINOPHEN 325 MG PO TABS
650.0000 mg | ORAL_TABLET | ORAL | Status: DC | PRN
  Administered 2021-01-23 – 2021-01-24 (×4): 650 mg via ORAL
  Filled 2021-01-23 (×4): qty 2

## 2021-01-23 MED ORDER — SODIUM CHLORIDE 0.9% FLUSH: 3.0000 mL | Freq: Two times a day (BID) | INTRAVENOUS | Status: DC

## 2021-01-23 MED ORDER — SODIUM CHLORIDE 0.9 % IV SOLN: 250.0000 mL | INTRAVENOUS | Status: DC | PRN

## 2021-01-23 MED ORDER — INSULIN GLARGINE 100 UNIT/ML ~~LOC~~ SOLN
10.0000 [IU] | Freq: Every day | SUBCUTANEOUS | Status: DC
  Administered 2021-01-23: 10 [IU] via SUBCUTANEOUS
  Filled 2021-01-23 (×2): qty 0.1

## 2021-01-23 MED ORDER — NIFEDIPINE ER OSMOTIC RELEASE 30 MG PO TB24
30.0000 mg | ORAL_TABLET | Freq: Every day | ORAL | Status: DC
  Administered 2021-01-23: 30 mg via ORAL

## 2021-01-23 MED ORDER — ASPIRIN 81 MG PO CHEW
81.0000 mg | CHEWABLE_TABLET | Freq: Every day | ORAL | Status: DC
  Administered 2021-01-23: 81 mg via ORAL
  Filled 2021-01-23: qty 1

## 2021-01-23 NOTE — Progress Notes (Signed)
Called M. Eunice Blase, CNM to verify if patient should take her HS lantus insulin 10u. Verbal order given to give Lantus 10u Subcutaneously at HS. Will notify patient on updated medication management plan.
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
## 2021-01-23 NOTE — H&P (Addendum)
Sierra Jordan is a 30 y.o. female presenting for evaluation of high blood pressure and recent headaches. She was seen in  the office at [facility] this morning, c/o a headache that she has been addressing for two days. It responds to Tylenol, but has required repetitive dosing. Her pregnancy is high risk, marked by the problem list and previous medical history noted below. Most notably, she has a Hx of ; IUFD at [redacted] weeks gestation for severe pre eclampsia, gestational diabetes ( now on Lantus), morbid obesity,  Three SABs, and a previous Cesarean section. The POC as discussed with the [facility] provider is to admit her for lab testing to rule out pre eclampsia, monitor her blood pressures serially, and start a 24 hour urine collection for total protein. OB History    Gravida  8   Para  2   Term  1   Preterm  1   AB  5   Living  1     SAB  3   IAB  0   Ectopic  0   Multiple  0   Live Births  1          Past Medical History:  Diagnosis Date  . Family history of breast cancer    12/21 cancer genetic testing letter sent  . Gestational diabetes   . Gestational hypertension 11/02/2016  . History of pregnancy induced hypertension 08/26/2020  . Hyperemesis gravidarum with dehydration 04/22/2016  . Hypertension   . Medical history non-contributory   . Preeclampsia in postpartum period 01/19/2019  . Preeclampsia, severe 01/19/2019  . Stillbirth    Past Surgical History:  Procedure Laterality Date  . CESAREAN SECTION N/A 01/13/2019   Procedure: CESAREAN SECTION;  Surgeon: Conard Novak, MD;  Location: [facility] ORS;  Service: Obstetrics;  Laterality: N/A;  . DILATION AND CURETTAGE OF UTERUS  2008, 2012   D&E EAB  . DILATION AND CURETTAGE OF UTERUS N/A 11/03/2016   Procedure: DILATATION AND CURETTAGE;  Surgeon: Nadara Mustard, MD;  Location: [facility] ORS;  Service: Gynecology;  Laterality: N/A;   Family History: family history includes Breast cancer in her maternal aunt; Breast cancer  (age of onset: 69) in her mother and another family member; Depression in her mother; Diabetes in her father and mother; Hyperlipidemia in her mother; Hypertension in her mother; Pancreatitis in her mother. Social History:  reports that she quit smoking about 8 weeks ago. Her smoking use included cigarettes and cigars. She has a 0.20 pack-year smoking history. She has never used smokeless tobacco. She reports previous drug use. Drug: Marijuana. She reports that she does not drink alcohol.     Maternal Diabetes: Yes:  Diabetes Type:  Insulin/Medication controlled -lantus 10 units q HS Genetic Screening: Normal Maternal Ultrasounds/Referrals: Normal Fetal Ultrasounds or other Referrals:  Referred to Materal Fetal Medicine  Maternal Substance Abuse:  Previous MJ use Significant Maternal Medications:  Meds include: Other: Lantus Significant Maternal Lab Results:  None Other Comments:  she has been referred to the Diabetes Educator for guidance with her glucose levels and has not shown for two appointments.  Review of Systems  Constitutional: Negative.   HENT: Negative.   Eyes: Negative.   Respiratory: Negative.   Cardiovascular: Negative.   Gastrointestinal:       Gravid abdomen. High BMI- adipose  Endocrine:       Gestational diabetic  Musculoskeletal: Negative.   Skin: Negative.   Allergic/Immunologic: Negative.   Neurological: Negative.   Hematological: Negative.  Psychiatric/Behavioral: The patient is nervous/anxious.    History   Blood pressure 138/83, pulse 72, temperature 98 F (36.7 C), temperature source Oral, resp. rate 16, height 5\' 2"  (1.575 m), weight 118.4 kg, last menstrual period 06/23/2020, unknown if currently breastfeeding. Exam Physical Exam Constitutional:      Appearance: Normal appearance. She is obese.  HENT:     Head: Normocephalic and atraumatic.     Nose: Nose normal.  Cardiovascular:     Rate and Rhythm: Normal rate and regular rhythm.     Pulses:  Normal pulses.     Heart sounds: Normal heart sounds.  Pulmonary:     Effort: Pulmonary effort is normal.     Breath sounds: Normal breath sounds.  Abdominal:     General: Bowel sounds are normal.     Palpations: Abdomen is soft.     Comments: Gravid abdomen. + fetal movement. No contractions reported or palpated  Genitourinary:    Comments: SVE deferred today. Musculoskeletal:        General: Normal range of motion.     Cervical back: Normal range of motion and neck supple.  Skin:    General: Skin is warm and dry.  Neurological:     General: No focal deficit present.     Mental Status: She is alert and oriented to person, place, and time.  Psychiatric:        Thought Content: Thought content normal.        Judgment: Judgment normal.     Comments: She expresses feeling a bit anxious- due to her previous fetal loss related to pre eclampsia.     Prenatal labs: ABO, Rh: A/Positive/-- (11/30 1624) Antibody: Negative (11/30 1624) Rubella: 9.20 (11/30 1624) RPR: Non Reactive (11/30 1624)  HBsAg: Negative (11/30 1624)  HIV: Non Reactive (11/30 1624)  GBS:     Assessment/Plan: IUP 30 weeks 4 days Gestational diabetes Labile blood pressures persistent headache History of severe pre eclampsia History of 35 week fetal  loss  History of previous cesarean Section  Admitted for observation to labor and Delivery EFM for NST Serial blood pressures q 15 minutes x 5 Labs: CMP, CBC, UPC ratio, uric acid Start 24 hour ruine collection for protein Diabetes education inpatient referral made. Diabetic diet lantus 10 units q HS Glucose checks AC and HS Social work referral to [facility] has been made.  Will discuss results of labs and EFM with Dr. 05-20-2000.    Jerene Pitch 01/23/2021, 4:42 PM

## 2021-01-23 NOTE — Progress Notes (Signed)
Sierra Jordan presented to L&D today after being seen in office with an elevated blood pressure. She was additionally complaining of a headache. She took tylenol at home and reports her headache has improved. She denies vision changes. She denies RUQ pain. She denies swelling.   She was triaged for high blood pressures on 01/21/2021. Lab evaluation for preeclampsia was normal and she was started on labetalol. She was started on labetalol. However she did not take the labetalol this morning.   She has a history of prior sudden onset severe preeclampsia, abruption and IUFD at 35 weeks.   This pregnancy has been complicated by gestational diabetes. She reports normal blood glucose values at home. She takes 10 units of Lantus at night.   BPs since she has been in the hospital have ranged from 122-154 systolic. Diastolic BPs have ranged from 16-109.  Lab evaluation for preeclampsia was normal.   Discussed her case with Dr. Grace Bushy, the MFM on call.   I communicated with Sierra Jordan that Dr. Grace Bushy recommended changing the patient to 30 mg of procardia XL. Goal would be to control her blood pressures less than 150/100. Continue with admission for observation of BP and collection of 24 hour urine. Assuming blood pressures are controlled and urine protein is not elevated discharge home can be considered. Plan for follow up would be twice weekly prenatal appointments for blood pressure checks, once weekly preeclampsia labs, and once a week NSTs.   Patient works as a Associate Professor. Early leave from work can be considered.   She does have a blood pressure cuff at home which she uses to check her BPs. We discussed checking her blood pressures twice a day and that she should come to the hospital if she has signs of preeclampsia or blood pressures greater than 160 systolic or greater than 110 diastolic.       Adelene Idler MD, [facility], [facility] 01/23/2021 9:46 PM

## 2021-01-24 DIAGNOSIS — O1403 Mild to moderate pre-eclampsia, third trimester: Secondary | ICD-10-CM | POA: Diagnosis not present

## 2021-01-24 DIAGNOSIS — O24414 Gestational diabetes mellitus in pregnancy, insulin controlled: Secondary | ICD-10-CM

## 2021-01-24 DIAGNOSIS — O99019 Anemia complicating pregnancy, unspecified trimester: Secondary | ICD-10-CM

## 2021-01-24 DIAGNOSIS — D649 Anemia, unspecified: Secondary | ICD-10-CM

## 2021-01-24 DIAGNOSIS — O99013 Anemia complicating pregnancy, third trimester: Secondary | ICD-10-CM | POA: Diagnosis not present

## 2021-01-24 DIAGNOSIS — Z3A30 30 weeks gestation of pregnancy: Secondary | ICD-10-CM

## 2021-01-24 DIAGNOSIS — O0940 Supervision of pregnancy with grand multiparity, unspecified trimester: Secondary | ICD-10-CM

## 2021-01-24 LAB — PROTEIN, URINE, 24 HOUR
Collection Interval-UPROT: 24 hours
Protein, 24H Urine: 436 mg/d — ABNORMAL HIGH (ref 50–100)
Protein, Urine: 16 mg/dL
Urine Total Volume-UPROT: 2725 mL

## 2021-01-24 LAB — GLUCOSE, CAPILLARY: Glucose-Capillary: 94 mg/dL (ref 70–99)

## 2021-01-24 MED ORDER — ACETAMINOPHEN 325 MG PO TABS: 650.0000 mg | ORAL_TABLET | ORAL | Status: DC | PRN

## 2021-01-24 MED ORDER — NIFEDIPINE ER 30 MG PO TB24: 30.0000 mg | ORAL_TABLET | Freq: Every day | ORAL | 1 refills | Status: DC

## 2021-01-24 MED ORDER — FERROUS SULFATE 325 (65 FE) MG PO TABS: 325.0000 mg | ORAL_TABLET | Freq: Every day | ORAL | 1 refills | Status: DC

## 2021-01-24 NOTE — Discharge Summary (Signed)
Discharge Summary  Patient ID: Sierra Jordan MRN: [id number] DOB/AGE: 27-Oct-1990 30 y.o.  Admit date: 01/23/2021 Admitting provider: No admitting provider for patient encounter. Discharge date: 01/24/2021   Admission Diagnoses: Gestational hypertension affecting eighth pregnancy Insulin controlled gestational diabetes mellitus (GDM) during pregnancy, antepartum [redacted] weeks gestation  Discharge Diagnoses:  Active Problems:   Mild preeclampsia, third trimester   Anemia affecting eighth pregnancy [redacted] weeks gestation   History of Present Illness: The patient is a 30 y.o. female [phone] at 65w5dwho presented for evaluation of high blood pressure and recent headaches. She was seen in the office at [facility] 4/30 with c/o a headache that she has been addressing for two days. It responds to Tylenol, but has required repetitive dosing. Her pregnancy is high risk, marked by the problem list and previous medical history noted below. Most notably, she has a Hx of IUFD at 342weeks gestation for severe pre eclampsia, gestational diabetes (now on Lantus), morbid obesity,  three SABs, and a previous cesarean section. [facility] her for lab testing to rule out pre eclampsia, monitor her blood pressures serially, and start a 24 hour urine collection for total protein .   Past Medical History:  Diagnosis Date  . Family history of breast cancer    12/21 cancer genetic testing letter sent  . Gestational diabetes   . Gestational hypertension 11/02/2016  . History of pregnancy induced hypertension 08/26/2020  . Hyperemesis gravidarum with dehydration 04/22/2016  . Hypertension   . Medical history non-contributory   . Preeclampsia in postpartum period 01/19/2019  . Preeclampsia, severe 01/19/2019  . Stillbirth     Past Surgical History:  Procedure Laterality Date  . CESAREAN SECTION N/A 01/13/2019   Procedure: CESAREAN SECTION;  Surgeon: JWill Bonnet MD;   Location: [facility] ORS;  Service: Obstetrics;  Laterality: N/A;  . DILATION AND CURETTAGE OF UTERUS  2008, 2012   D&E EAB  . DILATION AND CURETTAGE OF UTERUS N/A 11/03/2016   Procedure: DILATATION AND CURETTAGE;  Surgeon: RGae Dry MD;  Location: [facility] ORS;  Service: Gynecology;  Laterality: N/A;    No current facility-administered medications on file prior to encounter.   Current Outpatient Medications on File Prior to Encounter  Medication Sig Dispense Refill  . Accu-Chek FastClix Lancets MISC Apply topically.    .Marland Kitchenaspirin 81 MG chewable tablet Chew 81 mg by mouth daily.    . Blood Glucose Monitoring Suppl (ACCU-CHEK GUIDE) w/Device KIT 1 Device by Does not apply route in the morning, at noon, in the evening, and at bedtime. 1 kit 0  . glucose blood (ACCU-CHEK GUIDE) test strip Use as instructed 100 each 12  . insulin glargine (LANTUS) 100 UNIT/ML Solostar Pen Inject 10 Units into the skin at bedtime. 15 mL 2  . Insulin Pen Needle 32G X 6 MM MISC 1 Units by Does not apply route daily. 100 each 0  . labetalol (NORMODYNE) 200 MG tablet Take 1 tablet (200 mg total) by mouth 2 (two) times daily. 60 tablet 1  . Lancets Misc. (ACCU-CHEK FASTCLIX LANCET) KIT 1 Device by Does not apply route 4 (four) times daily. 120 kit 12  . Prenatal Vit-Fe Fumarate-FA (PRENATAL MULTIVITAMIN) TABS tablet Take 1 tablet by mouth daily at 12 noon. 60 tablet 6    No Known Allergies  Social History   Socioeconomic History  . Marital status: Single    Spouse name: Not on file  .  Number of children: Not on file  . Years of education: Not on file  . Highest education level: Not on file  Occupational History  . Occupation: Occupational psychologist  Tobacco Use  . Smoking status: Former Smoker    Packs/day: 0.10    Years: 2.00    Pack years: 0.20    Types: Cigarettes, Cigars    Quit date: 11/25/2020    Years since quitting: 0.1  . Smokeless tobacco: Never Used  . Tobacco comment: 2-3 puffs a day Black & Milds   Substance and Sexual Activity  . Alcohol use: No  . Drug use: Not Currently    Types: Marijuana    Comment: Stopped 11/21  . Sexual activity: Yes    Birth control/protection: None  Other Topics Concern  . Not on file  Social History Narrative  . Not on file   Social Determinants of Health   Financial Resource Strain: Not on file  Food Insecurity: Not on file  Transportation Needs: Not on file  Physical Activity: Not on file  Stress: Not on file  Social Connections: Not on file  Intimate Partner Violence: Not on file    Family History  Problem Relation Age of Onset  . Diabetes Mother   . Hyperlipidemia Mother   . Hypertension Mother   . Depression Mother   . Pancreatitis Mother   . Breast cancer Mother 54  . Diabetes Father   . Breast cancer Maternal Aunt   . Breast cancer Other 40     Review of Systems  Constitutional: Negative.   HENT: Negative.   Eyes: Negative.   Respiratory: Negative.   Cardiovascular: Negative.   Gastrointestinal: Negative.   Genitourinary: Negative.   Musculoskeletal: Negative.   Skin: Negative.   Neurological: Positive for headaches.       Responding to tylenol administration  Endo/Heme/Allergies: Negative.   Psychiatric/Behavioral: Negative.      Objective: BP (!) 144/88   Pulse 72   Temp 98.3 F (36.8 C) (Oral)   Resp 18   Ht '5\' 2"'  (1.575 m)   Wt 118.4 kg   LMP 06/23/2020   BMI 47.74 kg/m    Consults: None  Significant Findings/ Diagnostic Studies: Labs - see lab flowsheet  Procedures: NSTs - multiple NSTs were completed throughout the admission which were appropriately reactive given early gestational age (10x10 accels appreciated)  Hospital Course: The patient was admitted to Labor and Delivery Triage for observation. Serial BP monitoring was completed throughout course of stay. Patient was started on Procardia XL 30 mg daily. No severe range blood pressures were noted during course of stay. Preeclampsia lab work was  obtained including a 24-h urine collection. The 24-h urine was significant for elevated protein. The patient was diagnosed with mild preeclampsia at this time. On discharge, patient denies symptoms of preeclampsia including HA, vision changes, RUQ pain. Throughout the admission the patient was noted to have glucose control with 10 u Lantus qhs. Plan of care was discussed thoroughly with patient with follow-up twice weekly at [facility] with NST weekly and HROB with preE labs weekly]. Plan was reviewed with Dr. Georgianne Fick via phone. Patient reports understanding regarding red flag S&S of preeclampsia including HA, vision changes, RUQ pain.   Discharge Condition: good  Discharge disposition: 01-Home or Self Care  Diet: Regular diet  Discharge Activity: Activity as tolerated   Allergies as of 01/24/2021   No Known Allergies     Medication List    STOP taking these  medications   labetalol 200 MG tablet Commonly known as: NORMODYNE     TAKE these medications   Accu-Chek FastClix Lancet Kit 1 Device by Does not apply route 4 (four) times daily.   Accu-Chek FastClix Lancets Misc Apply topically.   Accu-Chek Guide test strip Generic drug: glucose blood Use as instructed   Accu-Chek Guide w/Device Kit 1 Device by Does not apply route in the morning, at noon, in the evening, and at bedtime.   acetaminophen 325 MG tablet Commonly known as: TYLENOL Take 2 tablets (650 mg total) by mouth every 4 (four) hours as needed (for pain scale < 4ORtemperature>/=100.5 F).   aspirin 81 MG chewable tablet Chew 81 mg by mouth daily.   ferrous sulfate 325 (65 FE) MG tablet Take 1 tablet (325 mg total) by mouth daily with breakfast.   insulin glargine 100 UNIT/ML Solostar Pen Commonly known as: LANTUS Inject 10 Units into the skin at bedtime.   Insulin Pen Needle 32G X 6 MM Misc 1 Units by Does not apply route daily.   NIFEdipine 30 MG 24 hr tablet Commonly known as: ADALAT  CC Take 1 tablet (30 mg total) by mouth daily.   prenatal multivitamin Tabs tablet Take 1 tablet by mouth daily at 12 noon.       [place]. Schedule an appointment as soon as possible for a visit in 1 week(s).   Specialty: Obstetrics and Gynecology Why: Daneil Dan will contact you on Monday to schedule to visits this week. A message has been sent to our scheulder to contact you to arrange a HROB visit with NST and a HROB visit with labs this week. Contact information: [address] [place] [address] [phone]              Total time spent taking care of this patient: 40 minutes  Signed:  Orlie Pollen, CNM 01/24/2021, 6:35 PM

## 2021-01-24 NOTE — Progress Notes (Signed)
Subjective:  Did well overnight.  +FM, no LOF, no VB, no contractions.  Has had intermittent headaches but reports improvement cessation after taking Tylenol.  She does report increased anxiety as she has entered the third trimester and wonder if this could be contributing to her BP elevations.    Objective:  Vitals: Blood pressure (!) 142/70, pulse 76, temperature 98 F (36.7 C), temperature source Oral, resp. rate 16, height 5\' 2"  (1.575 m), weight 118.4 kg, last menstrual period 06/23/2020, unknown if currently breastfeeding.  Temp:  [98 F (36.7 C)] 98 F (36.7 C) (04/29 1449) Pulse Rate:  [58-88] 76 (04/30 0858) Resp:  [16] 16 (04/29 1449) BP: (120-155)/(70-109) 142/70 (04/30 0858) Weight:  [117.5 kg-118.4 kg] 118.4 kg (04/29 1449)  General: NAD Pulmonary: no increased work of breathing Abdomen: gravid, non-tender Ext: no pitting edema    Last NST FHT:  140, moderate, +accles (10x10), no decels Toco: none  Results for orders placed or performed during the hospital encounter of 01/23/21 (from the past 24 hour(s))  Protein / creatinine ratio, urine     Status: None   Collection Time: 01/23/21  3:08 PM  Result Value Ref Range   Creatinine, Urine 54 mg/dL   Total Protein, Urine 8 mg/dL   Protein Creatinine Ratio 0.15 0.00 - 0.15 mg/mg[Cre]  CBC     Status: Abnormal   Collection Time: 01/23/21  3:41 PM  Result Value Ref Range   WBC 7.8 4.0 - 10.5 K/uL   RBC 3.50 (L) 3.87 - 5.11 MIL/uL   Hemoglobin 9.7 (L) 12.0 - 15.0 g/dL   HCT 01/25/21 (L) 66.0 - 63.0 %   MCV 79.4 (L) 80.0 - 100.0 fL   MCH 27.7 26.0 - 34.0 pg   MCHC 34.9 30.0 - 36.0 g/dL   RDW 16.0 10.9 - 32.3 %   Platelets 287 150 - 400 K/uL   nRBC 0.0 0.0 - 0.2 %  Comprehensive metabolic panel     Status: Abnormal   Collection Time: 01/23/21  3:41 PM  Result Value Ref Range   Sodium 136 135 - 145 mmol/L   Potassium 3.6 3.5 - 5.1 mmol/L   Chloride 108 98 - 111 mmol/L   CO2 21 (L) 22 - 32 mmol/L   Glucose, Bld 101  (H) 70 - 99 mg/dL   BUN 12 6 - 20 mg/dL   Creatinine, Ser 01/25/21 0.44 - 1.00 mg/dL   Calcium 8.4 (L) 8.9 - 10.3 mg/dL   Total Protein 6.3 (L) 6.5 - 8.1 g/dL   Albumin 2.5 (L) 3.5 - 5.0 g/dL   AST 16 15 - 41 U/L   ALT 15 0 - 44 U/L   Alkaline Phosphatase 76 38 - 126 U/L   Total Bilirubin 0.5 0.3 - 1.2 mg/dL   GFR, Estimated 3.22 >02 mL/min   Anion gap 7 5 - 15  Uric acid     Status: None   Collection Time: 01/23/21  3:41 PM  Result Value Ref Range   Uric Acid, Serum 5.6 2.5 - 7.1 mg/dL  Glucose, capillary     Status: Abnormal   Collection Time: 01/23/21  4:09 PM  Result Value Ref Range   Glucose-Capillary 107 (H) 70 - 99 mg/dL  Glucose, capillary     Status: None   Collection Time: 01/23/21 10:48 PM  Result Value Ref Range   Glucose-Capillary 84 70 - 99 mg/dL    Assessment:   30 y.o. 01/25/21 [redacted]w[redacted]d   Plan:   1)  Gestational hypertension in setting of prior severe preeclampsia with 35 week IUFD - patient naturally reports some anxiety, we discussed that up to now has had normal BP's in clinic and that it is unlikely related to something like white coat hypertension.   - labs this week without abnormalities - started on Nifedipine XL 30mg  this admission - undergoing 24-hr urine collection - if BP remains adequately controlled, normal 24-hr urine protein, then anticipate twice weekly APT outpatient.  I think it is also reasonable to trend her lab values once weekly at these visits   2) Fetus  - cat I tracing - discussed BMZ for fetal lung maturity.  I discussed that at present there are no delivery indications but that may change within the week or she may make it to 37 weeks without need to delivery.  Discussed pros and cons of BMZ administration. Given her GDM I would probably recommend inpatient status during her BMZ administration as I anticipated needing to make adjustments do her insulin for the following week.  At present Mrs. Ryant opts to hold off on BMZ administration. -  last growth 01/20/2021 1600g or 3lbs 8oz c/w 53Tile AFI 14.2cm  3) GDM - BG well controlled on 10U of lantus   4) History cesarean section - patient would like TOLAC.  We discussed that this is reasonable if reassuring fetal status.  However, there may be factors such as fetal presentation, fetal surveillance, and maternal status that could lead 01/22/2021 to recommend cesarean section as preferred mode of delivery.    [place], MD, [facility], [facility] 01/24/2021, 9:11 AM

## 2021-01-26 ENCOUNTER — Telehealth: Payer: Self-pay

## 2021-01-26 NOTE — Telephone Encounter (Signed)
-----   Message from Zipporah Plants, [place] sent at 01/24/2021  5:52 PM EDT ----- Regarding: Urgently need to schedule HROB for this patient Hi ladies,  Miss Guilbault was evaluated at the hospital on Friday and Saturday. She was discharged home on Saturday. She needs to schedule two weekly HROB appointments for the next several weeks (4 weeks). Each week she needs -  -HROB with NST -HROB with labs  At least one visit each week needs to be with an MD.  We have had challenges with this patient getting her follow-up scheduled. She has become a very high risk patient with a complicated obstetric history. Could you please call her to make sure we get it set up?  Thanks, Jae Dire

## 2021-01-26 NOTE — Telephone Encounter (Signed)
Called and left voicemail for patient to call back to be scheduled. 

## 2021-01-27 ENCOUNTER — Other Ambulatory Visit: Payer: Self-pay

## 2021-01-27 ENCOUNTER — Ambulatory Visit (INDEPENDENT_AMBULATORY_CARE_PROVIDER_SITE_OTHER): Payer: Medicaid Other | Admitting: Obstetrics and Gynecology

## 2021-01-27 ENCOUNTER — Encounter: Payer: Self-pay | Admitting: Obstetrics and Gynecology

## 2021-01-27 VITALS — BP 130/90 | Wt 262.0 lb

## 2021-01-27 DIAGNOSIS — O0993 Supervision of high risk pregnancy, unspecified, third trimester: Secondary | ICD-10-CM | POA: Diagnosis not present

## 2021-01-27 DIAGNOSIS — Z3A31 31 weeks gestation of pregnancy: Secondary | ICD-10-CM

## 2021-01-27 DIAGNOSIS — O99213 Obesity complicating pregnancy, third trimester: Secondary | ICD-10-CM

## 2021-01-27 DIAGNOSIS — O133 Gestational [pregnancy-induced] hypertension without significant proteinuria, third trimester: Secondary | ICD-10-CM

## 2021-01-27 DIAGNOSIS — O24414 Gestational diabetes mellitus in pregnancy, insulin controlled: Secondary | ICD-10-CM

## 2021-01-27 NOTE — Patient Instructions (Signed)
Preeclampsia and Eclampsia Preeclampsia is a serious condition that may develop during pregnancy. This condition involves high blood pressure during pregnancy and causes symptoms such as headaches, vision changes, and increased swelling in the legs, hands, and face. Preeclampsia occurs after 20 weeks of pregnancy. Eclampsia is a seizure that happens from worsening preeclampsia. Diagnosing and managing preeclampsia early is important. If not treated early, it can cause serious problems for mother and baby. There is no cure for this condition. However, during pregnancy, delivering the baby may be the best treatment for preeclampsia or eclampsia. For most women, symptoms of preeclampsia and eclampsia go away after giving birth. In rare cases, a woman may develop preeclampsia or eclampsia after giving birth. This usually occurs within 48 hours after childbirth but may occur up to 6 weeks after giving birth. What are the causes? The cause of this condition is not known. What increases the risk? The following factors make you more likely to develop preeclampsia:  Being pregnant for the first time or being pregnant with multiples.  Having had preeclampsia or a condition called hemolysis, elevated liver enzymes, and low platelet count (HELLP)syndrome during a past pregnancy.  Having a family history of preeclampsia.  Being older than age 35.  Being obese.  Becoming pregnant through fertility treatments. Conditions that reduce blood flow or oxygen to your placenta and baby may also increase your risk. These include:  High blood pressure before, during, or immediately following pregnancy.  Kidney disease.  Diabetes.  Blood clotting disorders.  Autoimmune diseases, such as lupus.  Sleep apnea. What are the signs or symptoms? Common symptoms of this condition include:  A severe, throbbing headache that does not go away.  Vision problems, such as blurred or double vision and light  sensitivity.  Pain in the stomach, especially the right upper region.  Pain in the shoulder. Other symptoms that may develop as the condition gets worse include:  Sudden weight gain because of fluid buildup in the body. This causes swelling of the face, hands, legs, and feet.  Severe nausea and vomiting.  Urinating less than usual.  Shortness of breath.  Seizures. How is this diagnosed? Your health care provider will ask you about symptoms and check for signs of preeclampsia during your prenatal visits. You will also have routine tests, including:  Checking your blood pressure.  Urine tests to check for protein.  Blood tests to assess your organ function.  Monitoring your baby's heart rate.  Ultrasounds to check fetal growth.   How is this treated? You and your health care provider will determine the treatment that is best for you. Treatment may include:  Frequent prenatal visits to check for preeclampsia.  Medicine to lower your blood pressure.  Medicine to prevent seizures.  Low-dose aspirin during your pregnancy.  Staying in the hospital, in severe cases. You will be given medicines to control your blood pressure and the amount of fluids in your body.  Delivering your baby. Work with your health care provider to manage any chronic health conditions, such as diabetes or kidney problems. Also, work with your health care provider to manage weight gain during pregnancy. Follow these instructions at home: Eating and drinking  Drink enough fluid to keep your urine pale yellow.  Avoid caffeine. Caffeine may increase blood pressure and heart rate and lead to dehydration.  Reduce the amount of salt that you eat. Lifestyle  Do not use any products that contain nicotine or tobacco. These products include cigarettes, chewing tobacco, and   vaping devices, such as e-cigarettes. If you need help quitting, ask your health care provider.  Do not use alcohol or drugs.  Avoid  stress as much as possible.  Rest and get plenty of sleep. General instructions  Take over-the-counter and prescription medicines only as told by your health care provider.  When lying down, lie on your left side. This keeps pressure off your major blood vessels.  When sitting or lying down, raise (elevate) your feet. Try putting pillows underneath your lower legs.  Exercise regularly. Ask your health care provider what kinds of exercise are best for you.  Check your blood pressure as often as recommended by your health care provider.  Keep all prenatal and follow-up visits. This is important.   Contact a health care provider if:  You have symptoms that may need treatment or closer monitoring. These include: ? Headaches. ? Stomach pain or nausea and vomiting. ? Shoulder pain. ? Vision problems, such as spots in front of your eyes or blurry vision. ? Sudden weight gain or increased swelling in your face, hands, legs, and feet. ? Increased anxiety or feeling of impending doom. ? Signs or symptoms of labor. Get help right away if:  You have any of the following symptoms: ? A seizure. ? Shortness of breath or trouble breathing. ? Trouble speaking or slurred speech. ? Fainting. ? Chest pain. These symptoms may represent a serious problem that is an emergency. Do not wait to see if the symptoms will go away. Get medical help right away. Call your local emergency services (911 in the [place] Do not drive yourself to the hospital. Summary  Preeclampsia is a serious condition that may develop during pregnancy.  Diagnosing and treating preeclampsia early is very important.  Keep all prenatal and follow-up visits. This is important.  Get help right away if you have a seizure, shortness of breath or trouble breathing, trouble speaking or slurred speech, chest pain, or fainting. This information is not intended to replace advice given to you by your health care provider. Make sure you  discuss any questions you have with your health care provider. Document Revised: 06/05/2020 Document Reviewed: 06/05/2020 Elsevier Patient Education  2021 Elsevier Inc.  

## 2021-01-27 NOTE — Progress Notes (Signed)
Routine Prenatal Care Visit  Subjective  Sierra Jordan is a 30 y.o. [id number] at [redacted]w[redacted]d being seen today for ongoing prenatal care.  She is currently monitored for the following issues for this high-risk pregnancy and has Supervision of high-risk pregnancy, third trimester; Obesity affecting pregnancy in third trimester; Stillbirth with antepartum death; History of gestational diabetes; BMI 40.0-44.9, adult (HCC); Sickle cell trait (HCC); Pregnancy with poor reproductive history in first trimester; Insulin controlled gestational diabetes mellitus (GDM) during pregnancy, antepartum; [redacted] weeks gestation of pregnancy; Mild preeclampsia, third trimester; and Anemia affecting eighth pregnancy on their problem list.  ----------------------------------------------------------------------------------- Patient reports no complaints.   Contractions: Not present. Vag. Bleeding: None.  Movement: Present. Denies leaking of fluid.  ----------------------------------------------------------------------------------- The following portions of the patient's history were reviewed and updated as appropriate: allergies, current medications, past family history, past medical history, past social history, past surgical history and problem list. Problem list updated.   Objective  Blood pressure 130/90, weight 262 lb (118.8 kg), last menstrual period 06/23/2020, unknown if currently breastfeeding. Pregravid weight 240 lb (108.9 kg) Total Weight Gain 22 lb (9.979 kg) Urinalysis:      Fetal Status:     Movement: Present     General:  Alert, oriented and cooperative. Patient is in no acute distress.  Skin: Skin is warm and dry. No rash noted.   Cardiovascular: Normal heart rate noted  Respiratory: Normal respiratory effort, no problems with respiration noted  Abdomen: Soft, gravid, appropriate for gestational age. Pain/Pressure: Absent     Pelvic:  Cervical exam deferred        Extremities: Normal range of motion.      Mental Status: Normal mood and affect. Normal behavior. Normal judgment and thought content.     Assessment   29 y.o. [id number] at 110w1d by  03/30/2021, by Last Menstrual Period presenting for routine prenatal visit  Plan   pregnancy 8 Problems (from 08/26/20 to present)    Problem Noted Resolved   [redacted] weeks gestation of pregnancy 01/21/2021 by Tresea Mall, CNM No   History of gestational diabetes 08/26/2020 by Zipporah Plants, CNM No   Supervision of high-risk pregnancy, third trimester 11/02/2016 by Conard Novak, MD No   Overview Addendum 01/27/2021  7:10 PM by Natale Milch, MD      Nursing Staff Provider  Office Location  [facility] Dating   LMP = 11 wk [place]  Language  English Anatomy US  complete  Flu Vaccine  Declines Genetic Screen  NIPS: declined  TDaP vaccine    Hgb A1C or  GTT Early :154, 3hr GTT all elevated  Covid  vaccinated   LAB RESULTS   Rhogam   not needed Blood Type A/Positive/-- (11/30 1624)   Feeding Plan  Antibody Negative (11/30 1624)  Contraception  Rubella 9.20 (11/30 1624)  Circumcision  RPR Non Reactive (11/30 1624)   Pediatrician   HBsAg Negative (11/30 1624)   Support Person  Lollie Sails HIV Non Reactive (11/30 1624)  Prenatal Classes  Varicella  Immune    GBS  (For PCN allergy, check sensitivities)   BTL Consent     VBAC Consent  [ ]  needed Pap  2020 NIL    Hgb Electro      CF      SMA         Obesity in pregnancy: pregravid BMI: History of cesarean section History of placental abruption and IUFD History of Preeclampsia Preeclampsia without severe features this pregnancy:  Started  on nifedipine XL 30 MG daily  Tobacco usage in pregnancy: patient has been able to stop  Twice a week visits for BP checks Once to twice a week NSTs Once weekly preeclampsia labs for monitoring  MFM Recommendations:  Recommendations  -First-trimester anatomy in 13 weeks (history of recurrent  early pregnancy losses).  -Screening for fetal aneuploidies.   -Aspirin 81 milligrams daily from 13 weeks till delivery.  -Fetal anatomy scan at 18 to 20 weeks.  -Fetal growth assessment every 4 weeks from 24 weeks'  gestation.  -Weekly BPP from 32 weeks.  -Delivery at 39 weeks. If considerable maternal anxiety is  present or if the patient has symptom of vaginal bleeding,  delivery should be considered at 37 weeks.  -Smoking cessation counseling if patient resumes smoking.       Previous Version   Obesity affecting pregnancy in third trimester 11/02/2016 by Conard Novak, MD No   History of pregnancy induced hypertension 08/26/2020 by Zipporah Plants, CNM 01/23/2021 by Mirna Mires, CNM      Glucose log reviewed, largely within normal limits, continue lantus 10 units at night.  She is taking her BP at home and has had normal BP so far. Normal BP today in office. She is taking her Procardia.  Plan for weekly Preeclampsia labs and once to twice weekly NSTs.  Briefly touched on TOLAC counseling. She does desire VBAC, provided with ACOG information. Good candidate given her history of prior successful vaginal delivery. She understands warning signs of preeclampsia and close follow up is planned. She know also to come to the hospital for a low threshold of care.   TDAP, early GBS,  and TOLAC counseling needed next visit  NST: 140 bpm baseline, moderate variability, 10x10 accelerations, no decelerations.  Gestational age appropriate obstetric precautions including but not limited to vaginal bleeding, contractions, leaking of fluid and fetal movement were reviewed in detail with the patient.    No follow-ups on file.  Natale Milch MD [facility], [facility] 01/27/2021, 3:44 PM

## 2021-01-28 LAB — PROTEIN / CREATININE RATIO, URINE
Creatinine, Urine: 176.2 mg/dL
Protein, Ur: 76.7 mg/dL
Protein/Creat Ratio: 435 mg/g creat — ABNORMAL HIGH (ref 0–200)

## 2021-01-28 LAB — COMPREHENSIVE METABOLIC PANEL
ALT: 22 IU/L (ref 0–32)
AST: 18 IU/L (ref 0–40)
Albumin/Globulin Ratio: 1.1 — ABNORMAL LOW (ref 1.2–2.2)
Albumin: 3.3 g/dL — ABNORMAL LOW (ref 3.9–5.0)
Alkaline Phosphatase: 108 IU/L (ref 44–121)
BUN/Creatinine Ratio: 12 (ref 9–23)
BUN: 8 mg/dL (ref 6–20)
Bilirubin Total: <0.2 mg/dL (ref 0.0–1.2)
CO2: 19 mmol/L — ABNORMAL LOW (ref 20–29)
Calcium: 8.9 mg/dL (ref 8.7–10.2)
Chloride: 105 mmol/L (ref 96–106)
Creatinine, Ser: 0.66 mg/dL (ref 0.57–1.00)
Globulin, Total: 3 g/dL (ref 1.5–4.5)
Glucose: 114 mg/dL — ABNORMAL HIGH (ref 65–99)
Potassium: 4.2 mmol/L (ref 3.5–5.2)
Sodium: 139 mmol/L (ref 134–144)
Total Protein: 6.3 g/dL (ref 6.0–8.5)
eGFR: 121 mL/min/{1.73_m2} (ref >59)

## 2021-01-28 LAB — CBC
Hematocrit: 32.6 % — ABNORMAL LOW (ref 34.0–46.6)
Hemoglobin: 11 g/dL — ABNORMAL LOW (ref 11.1–15.9)
MCH: 27.6 pg (ref 26.6–33.0)
MCHC: 33.7 g/dL (ref 31.5–35.7)
MCV: 82 fL (ref 79–97)
Platelets: 311 10*3/uL (ref 150–450)
RBC: 3.98 x10E6/uL (ref 3.77–5.28)
RDW: 13.4 % (ref 11.7–15.4)
WBC: 8 10*3/uL (ref 3.4–10.8)

## 2021-01-29 ENCOUNTER — Ambulatory Visit (INDEPENDENT_AMBULATORY_CARE_PROVIDER_SITE_OTHER): Payer: Medicaid Other | Admitting: Obstetrics and Gynecology

## 2021-01-29 ENCOUNTER — Other Ambulatory Visit: Payer: Self-pay

## 2021-01-29 VITALS — BP 130/90 | Wt 264.0 lb

## 2021-01-29 DIAGNOSIS — O99213 Obesity complicating pregnancy, third trimester: Secondary | ICD-10-CM

## 2021-01-29 DIAGNOSIS — O0993 Supervision of high risk pregnancy, unspecified, third trimester: Secondary | ICD-10-CM

## 2021-01-29 DIAGNOSIS — Z3A31 31 weeks gestation of pregnancy: Secondary | ICD-10-CM

## 2021-01-29 DIAGNOSIS — O1403 Mild to moderate pre-eclampsia, third trimester: Secondary | ICD-10-CM

## 2021-01-29 DIAGNOSIS — O24414 Gestational diabetes mellitus in pregnancy, insulin controlled: Secondary | ICD-10-CM

## 2021-01-29 DIAGNOSIS — Z23 Encounter for immunization: Secondary | ICD-10-CM

## 2021-01-29 LAB — POCT URINALYSIS DIPSTICK OB
Glucose, UA: NEGATIVE
POC,PROTEIN,UA: NEGATIVE

## 2021-01-29 NOTE — Addendum Note (Signed)
Addended by: Donnetta Hail on: 01/29/2021 02:17 PM   Modules accepted: Orders

## 2021-01-29 NOTE — Progress Notes (Signed)
Routine Prenatal Care Visit  Subjective  Sierra Jordan is a 30 y.o. [id number] at [redacted]w[redacted]d being seen today for ongoing prenatal care.  She is currently monitored for the following issues for this high-risk pregnancy and has Supervision of high-risk pregnancy, third trimester; Obesity affecting pregnancy in third trimester; Stillbirth with antepartum death; History of gestational diabetes; BMI 40.0-44.9, adult (HCC); Sickle cell trait (HCC); Pregnancy with poor reproductive history in first trimester; Insulin controlled gestational diabetes mellitus (GDM) during pregnancy, antepartum; Mild preeclampsia, third trimester; and Anemia affecting eighth pregnancy on their problem list.  ----------------------------------------------------------------------------------- Patient reports no complaints.  Patient reports fasting BG this AM- 97. No other BG checked yet today. She denies any HA, RUQ pain, SOB, vision changes, or edema. Contractions: Irregular. Vag. Bleeding: None.  Movement: Present. Denies leaking of fluid.  ----------------------------------------------------------------------------------- The following portions of the patient's history were reviewed and updated as appropriate: allergies, current medications, past family history, past medical history, past social history, past surgical history and problem list. Problem list updated.  Objective  Blood pressure 130/90, weight 264 lb (119.7 kg), last menstrual period 06/23/2020, unknown if currently breastfeeding. Pregravid weight 240 lb (108.9 kg) Total Weight Gain 24 lb (10.9 kg) Urinalysis:      Fetal Status: Fetal Heart Rate (bpm): 140 Fundal Height: 32 cm Movement: Present     General:  Alert, oriented and cooperative. Patient is in no acute distress.  Skin: Skin is warm and dry. No rash noted.   Cardiovascular: Normal heart rate noted  Respiratory: Normal respiratory effort, no problems with respiration noted  Abdomen: Soft, gravid,  appropriate for gestational age. Pain/Pressure: Absent     Pelvic:  Cervical exam deferred        Extremities: Normal range of motion.  Edema: None  ental Status: Normal mood and affect. Normal behavior. Normal judgment and thought content.     Assessment   29 y.o. [id number] at [redacted]w[redacted]d by  03/30/2021, by Last Menstrual Period presenting for routine prenatal visit  Plan   pregnancy 8 Problems (from 08/26/20 to present)    Problem Noted Resolved   History of gestational diabetes 08/26/2020 by Zipporah Plants, CNM No   Supervision of high-risk pregnancy, third trimester 11/02/2016 by Conard Novak, MD No   Overview Addendum 01/29/2021  2:01 PM by Zipporah Plants, CNM      Nursing Staff Provider  Office Location  [facility] Dating   LMP = 11 wk [place]  Language  English Anatomy US  complete  Flu Vaccine  Declines Genetic Screen  NIPS: declined  TDaP vaccine   01/29/21 Hgb A1C or  GTT Early :154, 3hr GTT all elevated  Covid  vaccinated   LAB RESULTS   Rhogam   not needed Blood Type A/Positive/-- (11/30 1624)   Feeding Plan  breast Antibody Negative (11/30 1624)  Contraception  considering BTL Rubella 9.20 (11/30 1624)  Circumcision  RPR Non Reactive (11/30 1624)   Pediatrician   HBsAg Negative (11/30 1624)   Support Person  Rapheal HIV Non Reactive (11/30 1624)  Prenatal Classes  Varicella  Immune    GBS  (For PCN allergy, check sensitivities)   BTL Consent  01/29/21    VBAC Consent  [ ]  needed Pap  2020 NIL    Hgb Electro      CF      SMA         Obesity in pregnancy: pregravid BMI: History of cesarean section History of placental abruption and IUFD  History of Preeclampsia Preeclampsia without severe features this pregnancy:  Started on nifedipine XL 30 MG daily  Tobacco usage in pregnancy: patient has been able to stop  Twice a week visits for BP checks Once to twice a week NSTs Once weekly preeclampsia labs for monitoring  MFM Recommendations:  Recommendations  -First-trimester  anatomy in 13 weeks (history of recurrent  early pregnancy losses).  -Screening for fetal aneuploidies.  -Aspirin 81 milligrams daily from 13 weeks till delivery.  -Fetal anatomy scan at 18 to 20 weeks.  -Fetal growth assessment every 4 weeks from 24 weeks'  gestation.  -Weekly BPP from 32 weeks.  -Delivery at 39 weeks. If considerable maternal anxiety is  present or if the patient has symptom of vaginal bleeding,  delivery should be considered at 37 weeks.  -Smoking cessation counseling if patient resumes smoking.       Previous Version   Obesity affecting pregnancy in third trimester 11/02/2016 by Conard Novak, MD No   [redacted] weeks gestation of pregnancy 01/21/2021 by Tresea Mall, CNM 01/29/2021 by Zipporah Plants, CNM   History of pregnancy induced hypertension 08/26/2020 by Zipporah Plants, CNM 01/23/2021 by Mirna Mires, CNM      -Contraception counseling today - considering BTL - consents signed -BP wnl - taking procardia and checking home BP - patient states values have been 120-130/85-90 -PIH labs today -Tdap today  Preterm obstetric precautions including but not limited to vaginal bleeding, contractions, leaking of fluid, preE S&S, and fetal movement were reviewed in detail with the patient.    Keep previously scheduled follow-up.  Zipporah Plants, CNM, MSN [facility], [facility] 01/29/2021, 2:01 PM

## 2021-01-30 LAB — COMPREHENSIVE METABOLIC PANEL WITH GFR
ALT: 18 [IU]/L (ref 0–32)
AST: 12 [IU]/L (ref 0–40)
Albumin/Globulin Ratio: 1.1 — ABNORMAL LOW (ref 1.2–2.2)
Albumin: 3.1 g/dL — ABNORMAL LOW (ref 3.9–5.0)
Alkaline Phosphatase: 101 [IU]/L (ref 44–121)
BUN/Creatinine Ratio: 24 — ABNORMAL HIGH (ref 9–23)
BUN: 12 mg/dL (ref 6–20)
Bilirubin Total: <0.2 mg/dL (ref 0.0–1.2)
CO2: 19 mmol/L — ABNORMAL LOW (ref 20–29)
Calcium: 9 mg/dL (ref 8.7–10.2)
Chloride: 103 mmol/L (ref 96–106)
Creatinine, Ser: 0.51 mg/dL — ABNORMAL LOW (ref 0.57–1.00)
Globulin, Total: 2.8 g/dL (ref 1.5–4.5)
Glucose: 83 mg/dL (ref 65–99)
Potassium: 4.1 mmol/L (ref 3.5–5.2)
Sodium: 136 mmol/L (ref 134–144)
Total Protein: 5.9 g/dL — ABNORMAL LOW (ref 6.0–8.5)
eGFR: 129 mL/min/{1.73_m2}

## 2021-01-30 LAB — CBC
Hematocrit: 32.2 % — ABNORMAL LOW (ref 34.0–46.6)
Hemoglobin: 10.6 g/dL — ABNORMAL LOW (ref 11.1–15.9)
MCH: 27.3 pg (ref 26.6–33.0)
MCHC: 32.9 g/dL (ref 31.5–35.7)
MCV: 83 fL (ref 79–97)
Platelets: 304 10*3/uL (ref 150–450)
RBC: 3.88 x10E6/uL (ref 3.77–5.28)
RDW: 13.6 % (ref 11.7–15.4)
WBC: 7.1 10*3/uL (ref 3.4–10.8)

## 2021-02-01 ENCOUNTER — Encounter: Payer: Self-pay | Admitting: Obstetrics and Gynecology

## 2021-02-01 ENCOUNTER — Other Ambulatory Visit: Payer: Self-pay

## 2021-02-01 ENCOUNTER — Observation Stay: Payer: Medicaid Other

## 2021-02-01 ENCOUNTER — Inpatient Hospital Stay
Admission: EM | Admit: 2021-02-01 | Discharge: 2021-02-19 | DRG: 787 | Disposition: A | Discharge status: Home / Self Care | Payer: Medicaid Other | Attending: Obstetrics & Gynecology | Admitting: Obstetrics & Gynecology

## 2021-02-01 DIAGNOSIS — O1403 Mild to moderate pre-eclampsia, third trimester: Secondary | ICD-10-CM | POA: Diagnosis not present

## 2021-02-01 DIAGNOSIS — D62 Acute posthemorrhagic anemia: Secondary | ICD-10-CM | POA: Diagnosis not present

## 2021-02-01 DIAGNOSIS — O9081 Anemia of the puerperium: Secondary | ICD-10-CM | POA: Diagnosis not present

## 2021-02-01 DIAGNOSIS — O4590 Premature separation of placenta, unspecified, unspecified trimester: Secondary | ICD-10-CM

## 2021-02-01 DIAGNOSIS — Z3A32 32 weeks gestation of pregnancy: Secondary | ICD-10-CM

## 2021-02-01 DIAGNOSIS — O09299 Supervision of pregnancy with other poor reproductive or obstetric history, unspecified trimester: Secondary | ICD-10-CM

## 2021-02-01 DIAGNOSIS — O114 Pre-existing hypertension with pre-eclampsia, complicating childbirth: Secondary | ICD-10-CM | POA: Diagnosis present

## 2021-02-01 DIAGNOSIS — O24424 Gestational diabetes mellitus in childbirth, insulin controlled: Secondary | ICD-10-CM | POA: Diagnosis present

## 2021-02-01 DIAGNOSIS — Z3A31 31 weeks gestation of pregnancy: Secondary | ICD-10-CM

## 2021-02-01 DIAGNOSIS — O24414 Gestational diabetes mellitus in pregnancy, insulin controlled: Secondary | ICD-10-CM

## 2021-02-01 DIAGNOSIS — Z87891 Personal history of nicotine dependence: Secondary | ICD-10-CM

## 2021-02-01 DIAGNOSIS — O4593 Premature separation of placenta, unspecified, third trimester: Secondary | ICD-10-CM | POA: Diagnosis not present

## 2021-02-01 DIAGNOSIS — O4690 Antepartum hemorrhage, unspecified, unspecified trimester: Secondary | ICD-10-CM

## 2021-02-01 DIAGNOSIS — O4693 Antepartum hemorrhage, unspecified, third trimester: Secondary | ICD-10-CM

## 2021-02-01 DIAGNOSIS — Z7982 Long term (current) use of aspirin: Secondary | ICD-10-CM

## 2021-02-01 DIAGNOSIS — D573 Sickle-cell trait: Secondary | ICD-10-CM | POA: Diagnosis present

## 2021-02-01 DIAGNOSIS — Z20822 Contact with and (suspected) exposure to covid-19: Secondary | ICD-10-CM | POA: Diagnosis present

## 2021-02-01 DIAGNOSIS — O34211 Maternal care for low transverse scar from previous cesarean delivery: Secondary | ICD-10-CM | POA: Diagnosis present

## 2021-02-01 DIAGNOSIS — O99213 Obesity complicating pregnancy, third trimester: Secondary | ICD-10-CM

## 2021-02-01 DIAGNOSIS — O99214 Obesity complicating childbirth: Secondary | ICD-10-CM | POA: Diagnosis present

## 2021-02-01 DIAGNOSIS — O0993 Supervision of high risk pregnancy, unspecified, third trimester: Secondary | ICD-10-CM

## 2021-02-01 DIAGNOSIS — O1002 Pre-existing essential hypertension complicating childbirth: Secondary | ICD-10-CM | POA: Diagnosis present

## 2021-02-01 DIAGNOSIS — Z8632 Personal history of gestational diabetes: Secondary | ICD-10-CM

## 2021-02-01 LAB — CBC
HCT: 29.6 % — ABNORMAL LOW (ref 36.0–46.0)
Hemoglobin: 10.3 g/dL — ABNORMAL LOW (ref 12.0–15.0)
MCH: 27.5 pg (ref 26.0–34.0)
MCHC: 34.8 g/dL (ref 30.0–36.0)
MCV: 78.9 fL — ABNORMAL LOW (ref 80.0–100.0)
Platelets: 334 10*3/uL (ref 150–400)
RBC: 3.75 MIL/uL — ABNORMAL LOW (ref 3.87–5.11)
RDW: 13.2 % (ref 11.5–15.5)
WBC: 7.1 10*3/uL (ref 4.0–10.5)
nRBC: 0 % (ref 0.0–0.2)

## 2021-02-01 LAB — COMPREHENSIVE METABOLIC PANEL
ALT: 16 U/L (ref 0–44)
AST: 16 U/L (ref 15–41)
Albumin: 2.7 g/dL — ABNORMAL LOW (ref 3.5–5.0)
Alkaline Phosphatase: 89 U/L (ref 38–126)
Anion gap: 5 (ref 5–15)
BUN: 15 mg/dL (ref 6–20)
CO2: 24 mmol/L (ref 22–32)
Calcium: 8.7 mg/dL — ABNORMAL LOW (ref 8.9–10.3)
Chloride: 107 mmol/L (ref 98–111)
Creatinine, Ser: 0.54 mg/dL (ref 0.44–1.00)
GFR, Estimated: >60 mL/min (ref >60)
Glucose, Bld: 78 mg/dL (ref 70–99)
Potassium: 3.7 mmol/L (ref 3.5–5.1)
Sodium: 136 mmol/L (ref 135–145)
Total Bilirubin: 0.3 mg/dL (ref 0.3–1.2)
Total Protein: 6.3 g/dL — ABNORMAL LOW (ref 6.5–8.1)

## 2021-02-01 LAB — KLEIHAUER-BETKE STAIN
Fetal Cells %: 0 %
Quantitation Fetal Hemoglobin: 0 mL

## 2021-02-01 LAB — APTT: aPTT: 29 seconds (ref 24–36)

## 2021-02-01 LAB — FIBRINOGEN: Fibrinogen: 571 mg/dL — ABNORMAL HIGH (ref 210–475)

## 2021-02-01 LAB — PROTEIN / CREATININE RATIO, URINE
Creatinine, Urine: 204 mg/dL
Protein Creatinine Ratio: 0.74 mg/mg{Cre} — ABNORMAL HIGH (ref 0.00–0.15)
Total Protein, Urine: 151 mg/dL

## 2021-02-01 LAB — GLUCOSE, CAPILLARY
Glucose-Capillary: 77 mg/dL (ref 70–99)
Glucose-Capillary: 89 mg/dL (ref 70–99)

## 2021-02-01 LAB — CHLAMYDIA/NGC RT PCR (ARMC ONLY)
Chlamydia Tr: NOT DETECTED
N gonorrhoeae: NOT DETECTED

## 2021-02-01 LAB — GROUP B STREP BY PCR: Group B strep by PCR: POSITIVE — AB

## 2021-02-01 LAB — PROTIME-INR
INR: 0.9 (ref 0.8–1.2)
Prothrombin Time: 12.6 seconds (ref 11.4–15.2)

## 2021-02-01 MED ORDER — DEXTROSE IN LACTATED RINGERS 5 % IV SOLN: INTRAVENOUS | Status: DC

## 2021-02-01 MED ORDER — HYDRALAZINE HCL 20 MG/ML IJ SOLN: 10.0000 mg | INTRAMUSCULAR | Status: DC | PRN

## 2021-02-01 MED ORDER — INSULIN ASPART 100 UNIT/ML IJ SOLN
0.0000 [IU] | Freq: Three times a day (TID) | INTRAMUSCULAR | Status: DC
  Administered 2021-02-02: 2 [IU] via SUBCUTANEOUS
  Administered 2021-02-02 (×2): 3 [IU] via SUBCUTANEOUS
  Administered 2021-02-02: 4 [IU] via SUBCUTANEOUS
  Administered 2021-02-03: 2 [IU] via SUBCUTANEOUS
  Administered 2021-02-03: 3 [IU] via SUBCUTANEOUS
  Administered 2021-02-04 – 2021-02-05 (×4): 2 [IU] via SUBCUTANEOUS
  Administered 2021-02-05 – 2021-02-06 (×2): 3 [IU] via SUBCUTANEOUS
  Administered 2021-02-06 (×2): 2 [IU] via SUBCUTANEOUS
  Administered 2021-02-07: 3 [IU] via SUBCUTANEOUS
  Administered 2021-02-07 – 2021-02-12 (×9): 2 [IU] via SUBCUTANEOUS
  Administered 2021-02-12: 3 [IU] via SUBCUTANEOUS
  Filled 2021-02-01 (×26): qty 1

## 2021-02-01 MED ORDER — INSULIN DETEMIR 100 UNIT/ML ~~LOC~~ SOLN
5.0000 [IU] | Freq: Two times a day (BID) | SUBCUTANEOUS | Status: DC
  Administered 2021-02-01 – 2021-02-08 (×13): 5 [IU] via SUBCUTANEOUS
  Filled 2021-02-01 (×15): qty 0.05

## 2021-02-01 MED ORDER — BETAMETHASONE SOD PHOS & ACET 6 (3-3) MG/ML IJ SUSP
12.0000 mg | INTRAMUSCULAR | Status: AC
  Administered 2021-02-01 – 2021-02-02 (×2): 12 mg via INTRAMUSCULAR
  Filled 2021-02-01: qty 5

## 2021-02-01 MED ORDER — LABETALOL HCL 5 MG/ML IV SOLN: 40.0000 mg | INTRAVENOUS | Status: DC | PRN

## 2021-02-01 MED ORDER — LABETALOL HCL 5 MG/ML IV SOLN: 80.0000 mg | INTRAVENOUS | Status: DC | PRN

## 2021-02-01 MED ORDER — INSULIN NPH (HUMAN) (ISOPHANE) 100 UNIT/ML ~~LOC~~ SUSP: 5.0000 [IU] | Freq: Two times a day (BID) | SUBCUTANEOUS | Status: DC

## 2021-02-01 MED ORDER — LACTATED RINGERS IV SOLN: INTRAVENOUS | Status: DC

## 2021-02-01 MED ORDER — LABETALOL HCL 5 MG/ML IV SOLN: 20.0000 mg | INTRAVENOUS | Status: DC | PRN

## 2021-02-01 NOTE — OB Triage Note (Signed)
Pt presents to L/D triage with reported bright red vaginal bleeding that began an hour prior. Bright red blood noted in the toilet and on tissue. Pt applied cloth. Small amount of bright red bleeding on assessment, no clots. Abdomen soft and non-tender. Pt reports positive fetal movement and no LOF or pain. Pt reports no recent intercourse or urinary symptoms.  New pad applied. Monitors applied and assessing. Initial BP 144/105- cycling q15 min. Last CBG 106-prior evening. Pt taking procardia as prescribed.  CNM aware.

## 2021-02-01 NOTE — H&P (Signed)
OB History & Physical   History of Present Illness:  Chief Complaint: vaginal bleeding  HPI:  Sierra Jordan is a 30 y.o. [id number] female at 66w6ddated by LMP consistent with an 11 week ultrasound.  Her pregnancy has been complicated by history of 35 week IUFD, gestational diabetes on insulin with questionable compliance, recently diagnosed preeclampsia without severe features, history of cesarean delivery, history of postpartum hemorrhage, obesity in pregnancy with BMI 47.    She denies contractions.   She denies leakage of fluid.   She reports vaginal bleeding.   She reports fetal movement.    About 530 or 6pm she went to the bathroom in preparation for leaving to go out to eat. When she wiped she noted blood on the tissue that was bright red. She looked in the toilet and saw blood. She has continued to see some bright red blood vaginally.  She denies abdominal pain and contractions.  She denies any inciting event. Last sexual encounter was 2 days ago.  She notes +FM, no gush of fluid.  She denies headaches, visual changes, and RUQ pain.  She was recently started on Procardia XL 30 mg daily, which she states she has been taking.  This was started for a new diagnosis of preeclampsia without severe features.   She is taking lantus 10 units at night. She states that she has been taking the insulin.  See clinic notes for diabetes control.   Total weight gain for pregnancy: 9.979 kg   Obstetrical Problem List: pregnancy 8 Problems (from 08/26/20 to present)    Problem Noted Resolved   History of gestational diabetes 08/26/2020 by VOrlie Pollen CNM No   Supervision of high-risk pregnancy, third trimester 11/02/2016 by JWill Bonnet MD No   Overview Addendum 01/29/2021  2:01 PM by VOrlie Pollen CNM      Nursing Staff Provider  Office Location  [facility] Dating   LMP = 11 wk [place] Language  English Anatomy [place] complete  Flu Vaccine  Declines Genetic Screen  NIPS: declined  TDaP vaccine    01/29/21 Hgb A1C or  GTT Early :154, 3hr GTT all elevated  Covid  vaccinated   LAB RESULTS   Rhogam   not needed Blood Type A/Positive/-- (11/30 1624)   Feeding Plan  breast Antibody Negative (11/30 1624)  Contraception  considering BTL Rubella 9.20 (11/30 1624)  Circumcision  RPR Non Reactive (11/30 1624)   Pediatrician   HBsAg Negative (11/30 1624)   Support Person  Rapheal HIV Non Reactive (11/30 1624)  Prenatal Classes  Varicella  Immune    GBS  (For PCN allergy, check sensitivities)   BTL Consent  01/29/21    VBAC Consent  '[ ]'  needed Pap  2020 NIL    Hgb Electro      CF      SMA         Obesity in pregnancy: pregravid BMI: History of cesarean section History of placental abruption and IUFD History of Preeclampsia Preeclampsia without severe features this pregnancy:  Started on nifedipine XL 30 MG daily  Tobacco usage in pregnancy: patient has been able to stop  Twice a week visits for BP checks Once to twice a week NSTs Once weekly preeclampsia labs for monitoring  MFM Recommendations:  Recommendations  -First-trimester anatomy in 13 weeks (history of recurrent  early pregnancy losses).  -Screening for fetal aneuploidies.  -Aspirin 81 milligrams daily from 13 weeks till delivery.  -Fetal anatomy scan at  18 to 20 weeks.  -Fetal growth assessment every 4 weeks from 24 weeks'  gestation.  -Weekly BPP from 32 weeks.  -Delivery at 39 weeks. If considerable maternal anxiety is  present or if the patient has symptom of vaginal bleeding,  delivery should be considered at 37 weeks.  -Smoking cessation counseling if patient resumes smoking.       Previous Version   Obesity affecting pregnancy in third trimester 11/02/2016 by Will Bonnet, MD No   History of pregnancy induced hypertension 08/26/2020 by Orlie Pollen, CNM 01/23/2021 by Imagene Riches, CNM       Maternal Medical History:   Past Medical History:  Diagnosis Date  . Family history of breast cancer     12/21 cancer genetic testing letter sent  . Gestational diabetes   . Gestational hypertension 11/02/2016  . History of pregnancy induced hypertension 08/26/2020  . Hyperemesis gravidarum with dehydration 04/22/2016  . Hypertension   . Medical history non-contributory   . Preeclampsia in postpartum period 01/19/2019  . Preeclampsia, severe 01/19/2019  . Stillbirth     Past Surgical History:  Procedure Laterality Date  . CESAREAN SECTION N/A 01/13/2019   Procedure: CESAREAN SECTION;  Surgeon: Will Bonnet, MD;  Location: [facility] ORS;  Service: Obstetrics;  Laterality: N/A;  . DILATION AND CURETTAGE OF UTERUS  2008, 2012   D&E EAB  . DILATION AND CURETTAGE OF UTERUS N/A 11/03/2016   Procedure: DILATATION AND CURETTAGE;  Surgeon: Gae Dry, MD;  Location: [facility] ORS;  Service: Gynecology;  Laterality: N/A;    No Known Allergies  Prior to Admission medications   Medication Sig Start Date End Date Taking? Authorizing Provider  Accu-Chek FastClix Lancets MISC Apply topically. 12/08/20  Yes [provider]  acetaminophen (TYLENOL) 325 MG tablet Take 2 tablets (650 mg total) by mouth every 4 (four) hours as needed (for pain scale < 4ORtemperature>/=100.5 F). 01/24/21  Yes Veal, Katelyn, CNM  aspirin 81 MG chewable tablet Chew 81 mg by mouth daily.   Yes [provider]  Blood Glucose Monitoring Suppl (ACCU-CHEK GUIDE) w/Device KIT 1 Device by Does not apply route in the morning, at noon, in the evening, and at bedtime. 12/08/20  Yes Orlie Pollen, CNM  ferrous sulfate 325 (65 FE) MG tablet Take 1 tablet (325 mg total) by mouth daily with breakfast. 01/24/21  Yes Veal, Katelyn, CNM  insulin glargine (LANTUS) 100 UNIT/ML Solostar Pen Inject 10 Units into the skin at bedtime. 01/02/21  Yes Malachy Mood, MD  Insulin Pen Needle 32G X 6 MM MISC 1 Units by Does not apply route daily. 01/02/21  Yes Malachy Mood, MD  Lancets Misc. (ACCU-CHEK FASTCLIX LANCET) KIT 1 Device by  Does not apply route 4 (four) times daily. 12/08/20  Yes Veal, Katelyn, CNM  NIFEdipine (ADALAT CC) 30 MG 24 hr tablet Take 1 tablet (30 mg total) by mouth daily. 01/24/21  Yes Veal, Marolyn Hammock, CNM  Prenatal Vit-Fe Fumarate-FA (PRENATAL MULTIVITAMIN) TABS tablet Take 1 tablet by mouth daily at 12 noon. 08/26/20  Yes Veal, Katelyn, CNM  glucose blood (ACCU-CHEK GUIDE) test strip Use as instructed 12/08/20   Orlie Pollen, CNM    OB History  Gravida Para Term Preterm AB Living  '8 2 1 1 5 1  ' SAB IAB Ectopic Multiple Live Births  3 0 0 0 1    # Outcome Date GA Lbr Len/2nd Weight Sex Delivery Anes PTL Lv  8 Current  7 Preterm 01/13/19 [redacted]w[redacted]d   CS-LTranv EPI, Spinal  FD  6 Term 11/03/16 325w0d  Vag-Spont  N LIV     Complications: Gestational diabetes  5 SAB 07/2015          4 AB 01/2015          3 SAB 06/2014          2 SAB 02/2011          1 AB 07/2007            Prenatal care site: [facility]  Social History: She  reports that she quit smoking about 2 months ago. Her smoking use included cigarettes and cigars. She has a 0.20 pack-year smoking history. She has never used smokeless tobacco. She reports previous drug use. Drug: Marijuana. She reports that she does not drink alcohol.  Family History: family history includes Breast cancer in her maternal aunt; Breast cancer (age of onset: 4070in her mother and another family member; Depression in her mother; Diabetes in her father and mother; Hyperlipidemia in her mother; Hypertension in her mother; Pancreatitis in her mother.   Review of Systems:  Review of Systems  Constitutional: Negative.   HENT: Negative.   Eyes: Negative.  Negative for blurred vision and double vision.  Respiratory: Negative.   Cardiovascular: Negative.   Gastrointestinal: Negative.   Genitourinary: Negative.        See HPI  Musculoskeletal: Negative.   Skin: Negative.   Neurological: Negative.  Negative for headaches.  Psychiatric/Behavioral:  Negative.      Physical Exam:  BP (!) 142/91   Pulse 79   Temp 98.4 F (36.9 C) (Oral)   Resp 18   Ht '5\' 2"'  (1.575 m)   Wt 118.8 kg   LMP 06/23/2020   BMI 47.92 kg/m   Physical Exam Constitutional:      General: She is not in acute distress.    Appearance: Normal appearance. She is well-developed.  Genitourinary:     Vulva, bladder and urethral meatus normal.     Right Labia: No rash, tenderness, lesions, skin changes or Bartholin's cyst.    Left Labia: No tenderness, lesions, skin changes, Bartholin's cyst or rash.    No inguinal adenopathy present in the right or left side.    Pelvic Tanner Score: 5/5.    No vaginal discharge, tenderness or bleeding.     Vaginal exam comments: Old blood mostly in the vaginal vault with some small clots. Very small amount of bright red blood in vault. This appears to be coming from cervix, though hard to identify for sure with such a small amount. No obvious vaginal source. .      Right Adnexa: not tender, not full and no mass present.    Left Adnexa: not tender, not full and no mass present.    No cervical motion tenderness, friability, lesion or polyp.     Cervical exam comments: 1/30/high/soft/mid.     Uterus is not enlarged, fixed or tender.     Uterus is anteverted.     No urethral tenderness or mass present.     Pelvic exam was performed with patient in the lithotomy position.  HENT:     Head: Normocephalic and atraumatic.  Eyes:     General: No scleral icterus.    Conjunctiva/sclera: Conjunctivae normal.  Cardiovascular:     Rate and Rhythm: Normal rate and regular rhythm.     Heart sounds: No murmur heard. No friction  rub. No gallop.   Pulmonary:     Effort: Pulmonary effort is normal. No respiratory distress.     Breath sounds: Normal breath sounds. No wheezing or rales.  Abdominal:     General: Bowel sounds are normal. There is no distension.     Palpations: Abdomen is soft. There is mass (gravid, soft and nontender).      Tenderness: There is no abdominal tenderness. There is no guarding or rebound.     Hernia: There is no hernia in the left inguinal area or right inguinal area.  Musculoskeletal:        General: Normal range of motion.     Cervical back: Normal range of motion and neck supple.  Lymphadenopathy:     Lower Body: No right inguinal adenopathy. No left inguinal adenopathy.  Neurological:     General: No focal deficit present.     Mental Status: She is alert and oriented to person, place, and time.     Cranial Nerves: No cranial nerve deficit.  Skin:    General: Skin is warm and dry.     Findings: No erythema.  Psychiatric:        Mood and Affect: Mood normal.        Behavior: Behavior normal.        Judgment: Judgment normal.   Female chaperone present for pelvic exam:     Lab Results  Component Value Date   WBC 7.1 02/01/2021   HGB 10.3 (L) 02/01/2021   HCT 29.6 (L) 02/01/2021   PLT 334 02/01/2021   CREATININE 0.54 02/01/2021   ALT 16 02/01/2021   AST 16 02/01/2021   PROTCRRATIO 0.15 01/23/2021   KB: pending    Baseline FHR: 145 beats/min   Variability: moderate   Accelerations: present   Decelerations: absent Contractions: present frequency: infrequent vs irritability Overall assessment: cat 1  Imaging Results US OB Limited  Result Date: 02/01/2021 CLINICAL DATA:  Vaginal bleeding. EXAM: LIMITED OBSTETRIC ULTRASOUND COMPARISON:  None. FINDINGS: Number of Fetuses: 1 Heart Rate:  150 bpm Movement: Yes Presentation: Cephalic Placental Location: Posterior Previa: No Amniotic Fluid (Subjective):  Within normal limits. AFI: 11.0 cm BPD: 7.46 cm 29 w  6 d MATERNAL FINDINGS: Cervix:  Appears closed (3.8 cm in length). Uterus/Adnexae: No abnormality visualized. IMPRESSION: Single, viable intrauterine pregnancy at approximately 29 weeks and 6 days gestation by ultrasound evaluation. This exam is performed on an emergent basis and does not comprehensively evaluate fetal size, dating, or  anatomy; follow-up complete OB US should be considered if further fetal assessment is warranted. Electronically Signed   By: Virgina Norfolk M.D.   On: 02/01/2021 19:17    Assessment:  Sierra Jordan is a 30 y.o. [id number] female at 62w6dwith third trimester vaginal bleeding, likely small abruption with no other identifiable source. Ultrasound is not suggestive of abruption. However, this is not typically a great indicator. She has preeclampsia without severe features.  No symptoms today. Normal labs.    Plan:  1. Admit to Labor & Delivery  2. CBC, T&S, NPO, IVF 3. GBS unknown.   4. Fetwal well-being: reassuring at this time.  Will give BMTZ at this time due to concern for emergent delivery or delivery due to worsening preeclampsia status.  5. Placental abruption: likely a small abruption of uncertain significance.  Will continue to monitor and deliver, as indicated.  If bleeding stops, consider discharge when stable.   If needs delivery for fetal indications, would proceed with c-section.  If labors or there is another indication, may consider TOLAC. Will need to discuss with patient.  Since delivery is not imminent (GA < 32 wks) and she does have preeclampsia with severe features, will not start magnesium. 6. GDMA2: Sliding scale insulin as I expect her BG to increase in response to BMTZ.  7. All discussed with patient and all questions answered.   Prentice Docker, MD 02/01/2021 8:00 PM

## 2021-02-02 DIAGNOSIS — O4693 Antepartum hemorrhage, unspecified, third trimester: Secondary | ICD-10-CM | POA: Diagnosis not present

## 2021-02-02 DIAGNOSIS — Z3A32 32 weeks gestation of pregnancy: Secondary | ICD-10-CM | POA: Diagnosis not present

## 2021-02-02 DIAGNOSIS — O1403 Mild to moderate pre-eclampsia, third trimester: Secondary | ICD-10-CM | POA: Diagnosis not present

## 2021-02-02 DIAGNOSIS — O24414 Gestational diabetes mellitus in pregnancy, insulin controlled: Secondary | ICD-10-CM | POA: Diagnosis not present

## 2021-02-02 LAB — GLUCOSE, CAPILLARY
Glucose-Capillary: 175 mg/dL — ABNORMAL HIGH (ref 70–99)
Glucose-Capillary: 155 mg/dL — ABNORMAL HIGH (ref 70–99)
Glucose-Capillary: 110 mg/dL — ABNORMAL HIGH (ref 70–99)
Glucose-Capillary: 135 mg/dL — ABNORMAL HIGH (ref 70–99)

## 2021-02-02 MED ORDER — ACETAMINOPHEN 325 MG PO TABS
650.0000 mg | ORAL_TABLET | ORAL | Status: DC | PRN
  Administered 2021-02-02 – 2021-02-14 (×10): 650 mg via ORAL
  Filled 2021-02-02 (×10): qty 2

## 2021-02-02 MED ORDER — NIFEDIPINE ER OSMOTIC RELEASE 30 MG PO TB24
30.0000 mg | ORAL_TABLET | Freq: Every day | ORAL | Status: DC
  Administered 2021-02-02 – 2021-02-04 (×3): 30 mg via ORAL
  Filled 2021-02-02 (×3): qty 1

## 2021-02-02 NOTE — Progress Notes (Signed)
Inpatient Diabetes Program Recommendations  Diabetes Treatment Program Recommendations  ADA Standards of Care Diabetes in Pregnancy Target Glucose Ranges:  Fasting: 60 - 90 mg/dL Preprandial: 60 - 233 mg/dL 1 hr postprandial: Less than 140mg /dL (from first bite of meal) 2 hr postprandial: Less than 120 mg/dL (from first bite of meal)   Results for CHARLOTT, CALVARIO (MRN Celestia Khat) as of 02/02/2021 07:28  Ref. Range 02/01/2021 19:21 02/01/2021 21:37 02/02/2021 06:11  Glucose-Capillary Latest Ref Range: 70 - 99 mg/dL 77 89 04/04/2021 (H)  Results for JULIZA, MACHNIK (MRN Celestia Khat) as of 02/02/2021 07:28  Ref. Range 02/01/2021 18:48  Glucose Latest Ref Range: 70 - 99 mg/dL 78   Review of Glycemic Control  Diabetes history: GDM; 32W Outpatient Diabetes medications: Lantus 10 units QHS Current orders for Inpatient glycemic control: Levemir 5 units BID, Novolog 0-16 units TID; Betamethasone 12 mg Q24H x2 (will get second dose today)  Inpatient Diabetes Program Recommendations:    Insulin: Agree with current inpatient orders for glycemic control.  NOTE: Noted consult for diabetes coordinator. Chart reviewed. Per chart, patient has GDM and takes Lantus 10 units QHS as an outpatient. Initial gluocse 78 mg/dl on 04/03/2021 and patient received Betamethasone 12 mg x1 on 02/01/21 and will receive second dose today. Agree with current orders. Will follow along while inpatient.  Thanks, 04/03/21, RN, MSN, CDE Diabetes Coordinator Inpatient Diabetes Program [phone] (Team Pager from 8am to 5pm)

## 2021-02-02 NOTE — Progress Notes (Signed)
Sierra Jordan is a 30 y.o. [id number] at [redacted]w[redacted]d by ultrasound admitted for vaginal bleeding at [redacted] weeks gestation. Her pregnancy is known high risk, with a problem list that includes: Obesity, GDM (insulin), mild pre eclampsia, History of an IUFD at [redacted] weeks gestation(severe Pre eclampsia).  She was admitted last evening after noting some bright red vaginal bleeding when wiping. She denies contractions today; reports her baby is moving well, and denies heavier bleeding. She does report that she sees red blood when she wipes, this morning.  Subjective:  See above note. At present she is comfortable. Denies any headache or visual disturbances; denies any chest pain or SOB.   Objective: BP (!) 153/93 (BP Location: Left Arm)   Pulse 60   Temp 98 F (36.7 C) (Oral)   Resp 18   Ht 5\' 2"  (1.575 m)   Wt 118.8 kg   LMP 06/23/2020   BMI 47.92 kg/m  I/O last 3 completed shifts: In: 1542 [I.V.:1542] Out: -  No intake/output data recorded.  FHT:  FHR: 120 baseline bpm, variability: moderate,  accelerations:  Present,  decelerations:  Absent UC:   none SVE:   Dilation: 1 Effacement (%): 30 Exam by:: [email or web address] MD  Labs: Lab Results  Component Value Date   WBC 7.1 02/01/2021   HGB 10.3 (L) 02/01/2021   HCT 29.6 (L) 02/01/2021   MCV 78.9 (L) 02/01/2021   PLT 334 02/01/2021    Assessment / Plan: Admitted anteparally for vaginal bleeding at 32 weeks gestaiton  High risk pregnancy : GDM on insulin, mild pre eclampsia Reactive, reassuring FHTS  Preeclampsia:  labs stable and not on magnesium at this time Fetal Wellbeing:  Category I Will continue with EFM. Diet advanced to regular diabetic diet tray.  Insulin orders per the Diabetes Team. Monitor blood pressures carefully. Will start magnesium sulphate for severe pressures. Would proceed with delivery should her bleeding increase or fetal status change pre EFM. Due for second dose of BMZ this evening.  04/03/2021 MSN, CNM 02/02/2021,  9:53 AM

## 2021-02-02 NOTE — Progress Notes (Signed)
RN to notify Eunice Blase CNM of fetal heart rate change at 15:40 and 16:15. Verbal orders to keep monitoring and report further decels. Will continue to monitor.

## 2021-02-03 ENCOUNTER — Observation Stay (HOSPITAL_BASED_OUTPATIENT_CLINIC_OR_DEPARTMENT_OTHER): Payer: Medicaid Other

## 2021-02-03 ENCOUNTER — Encounter: Payer: Medicaid Other | Admitting: Obstetrics & Gynecology

## 2021-02-03 DIAGNOSIS — Z3A32 32 weeks gestation of pregnancy: Secondary | ICD-10-CM | POA: Diagnosis not present

## 2021-02-03 DIAGNOSIS — O09299 Supervision of pregnancy with other poor reproductive or obstetric history, unspecified trimester: Secondary | ICD-10-CM

## 2021-02-03 DIAGNOSIS — Z3A33 33 weeks gestation of pregnancy: Secondary | ICD-10-CM | POA: Diagnosis not present

## 2021-02-03 DIAGNOSIS — O1493 Unspecified pre-eclampsia, third trimester: Secondary | ICD-10-CM | POA: Diagnosis not present

## 2021-02-03 DIAGNOSIS — O9081 Anemia of the puerperium: Secondary | ICD-10-CM | POA: Diagnosis not present

## 2021-02-03 DIAGNOSIS — O4593 Premature separation of placenta, unspecified, third trimester: Secondary | ICD-10-CM | POA: Diagnosis present

## 2021-02-03 DIAGNOSIS — O24414 Gestational diabetes mellitus in pregnancy, insulin controlled: Secondary | ICD-10-CM | POA: Diagnosis not present

## 2021-02-03 DIAGNOSIS — O1002 Pre-existing essential hypertension complicating childbirth: Secondary | ICD-10-CM | POA: Diagnosis present

## 2021-02-03 DIAGNOSIS — O4693 Antepartum hemorrhage, unspecified, third trimester: Secondary | ICD-10-CM

## 2021-02-03 DIAGNOSIS — Z7982 Long term (current) use of aspirin: Secondary | ICD-10-CM | POA: Diagnosis not present

## 2021-02-03 DIAGNOSIS — O458X3 Other premature separation of placenta, third trimester: Secondary | ICD-10-CM | POA: Diagnosis not present

## 2021-02-03 DIAGNOSIS — Z20822 Contact with and (suspected) exposure to covid-19: Secondary | ICD-10-CM | POA: Diagnosis present

## 2021-02-03 DIAGNOSIS — O24424 Gestational diabetes mellitus in childbirth, insulin controlled: Secondary | ICD-10-CM | POA: Diagnosis present

## 2021-02-03 DIAGNOSIS — D62 Acute posthemorrhagic anemia: Secondary | ICD-10-CM | POA: Diagnosis not present

## 2021-02-03 DIAGNOSIS — O99214 Obesity complicating childbirth: Secondary | ICD-10-CM | POA: Diagnosis present

## 2021-02-03 DIAGNOSIS — O09293 Supervision of pregnancy with other poor reproductive or obstetric history, third trimester: Secondary | ICD-10-CM | POA: Diagnosis not present

## 2021-02-03 DIAGNOSIS — O114 Pre-existing hypertension with pre-eclampsia, complicating childbirth: Secondary | ICD-10-CM | POA: Diagnosis present

## 2021-02-03 DIAGNOSIS — O34211 Maternal care for low transverse scar from previous cesarean delivery: Secondary | ICD-10-CM | POA: Diagnosis present

## 2021-02-03 DIAGNOSIS — Z3A31 31 weeks gestation of pregnancy: Secondary | ICD-10-CM | POA: Diagnosis not present

## 2021-02-03 DIAGNOSIS — O1403 Mild to moderate pre-eclampsia, third trimester: Secondary | ICD-10-CM

## 2021-02-03 DIAGNOSIS — O1404 Mild to moderate pre-eclampsia, complicating childbirth: Secondary | ICD-10-CM | POA: Diagnosis not present

## 2021-02-03 DIAGNOSIS — Z87891 Personal history of nicotine dependence: Secondary | ICD-10-CM | POA: Diagnosis not present

## 2021-02-03 DIAGNOSIS — D573 Sickle-cell trait: Secondary | ICD-10-CM | POA: Diagnosis present

## 2021-02-03 LAB — KLEIHAUER-BETKE STAIN
Fetal Cells %: 0 %
Quantitation Fetal Hemoglobin: 0 mL

## 2021-02-03 LAB — BPAM RBC
Blood Product Expiration Date: 202205282359
Blood Product Expiration Date: 202205282359
Unit Type and Rh: 6200
Unit Type and Rh: 6200

## 2021-02-03 LAB — TYPE AND SCREEN
ABO/RH(D): A POS
Antibody Screen: NEGATIVE
Unit division: 0
Unit division: 0

## 2021-02-03 LAB — GLUCOSE, CAPILLARY
Glucose-Capillary: 124 mg/dL — ABNORMAL HIGH (ref 70–99)
Glucose-Capillary: 127 mg/dL — ABNORMAL HIGH (ref 70–99)
Glucose-Capillary: 86 mg/dL (ref 70–99)
Glucose-Capillary: 91 mg/dL (ref 70–99)
Glucose-Capillary: 102 mg/dL — ABNORMAL HIGH (ref 70–99)

## 2021-02-03 LAB — CBC
HCT: 29.8 % — ABNORMAL LOW (ref 36.0–46.0)
Hemoglobin: 10.3 g/dL — ABNORMAL LOW (ref 12.0–15.0)
MCH: 27.2 pg (ref 26.0–34.0)
MCHC: 34.6 g/dL (ref 30.0–36.0)
MCV: 78.6 fL — ABNORMAL LOW (ref 80.0–100.0)
Platelets: 347 10*3/uL (ref 150–400)
RBC: 3.79 MIL/uL — ABNORMAL LOW (ref 3.87–5.11)
RDW: 13.4 % (ref 11.5–15.5)
WBC: 11.1 10*3/uL — ABNORMAL HIGH (ref 4.0–10.5)
nRBC: 0 % (ref 0.0–0.2)

## 2021-02-03 LAB — PROTIME-INR
INR: 1 (ref 0.8–1.2)
Prothrombin Time: 12.7 seconds (ref 11.4–15.2)

## 2021-02-03 LAB — APTT: aPTT: 26 seconds (ref 24–36)

## 2021-02-03 LAB — PREPARE RBC (CROSSMATCH)

## 2021-02-03 LAB — FIBRINOGEN: Fibrinogen: 528 mg/dL — ABNORMAL HIGH (ref 210–475)

## 2021-02-03 NOTE — Progress Notes (Signed)
Inpatient Diabetes Program Recommendations  Diabetes Treatment Program Recommendations  ADA Standards of Care Diabetes in Pregnancy Target Glucose Ranges:  Fasting: 60 - 90 mg/dL Preprandial: 60 - 827 mg/dL 1 hr postprandial: Less than 140mg /dL (from first bite of meal) 2 hr postprandial: Less than 120 mg/dL (from first bite of meal)   Results for LINDAMARIE, MACLACHLAN (MRN Celestia Khat) as of 02/03/2021 09:54  Ref. Range 02/03/2021 07:16 02/03/2021 09:27  Glucose-Capillary Latest Ref Range: 70 - 99 mg/dL 04/05/2021 (H) 201 (H)   Results for MKENZIE, DOTTS (MRN Celestia Khat) as of 02/03/2021 06:26  Ref. Range 02/02/2021 06:11 02/02/2021 10:20 02/02/2021 13:55 02/02/2021 21:14  Glucose-Capillary Latest Ref Range: 70 - 99 mg/dL 04/04/2021 (H) 254 (H) 982 (H) 135 (H)   Review of Glycemic Control  Diabetes history: GDM; 32W Outpatient Diabetes medications: Lantus 10 units QHS Current orders for Inpatient glycemic control: Levemir 5 units BID, Novolog 0-16 units TID; Betamethasone 12 mg Q24H x2 (got second dose 02/02/21)  Inpatient Diabetes Program Recommendations:    Insulin: Please consider increasing Levemir to 7 units BID.  Thanks, 04/04/21, RN, MSN, CDE Diabetes Coordinator Inpatient Diabetes Program [phone] (Team Pager from 8am to 5pm)

## 2021-02-03 NOTE — Consult Note (Signed)
   30 + Weeks  Neonatology Consult  Note:  At the request of the patients obstetrician Dr.  Georgianne Fick   I met with  Sierra Jordan who is at 32.1  wks currently with preg complicated by  Chronic hypertension on procardia, a history of IUFD at 35 weeks due to abruption, gestational diabetes managed with insulin, and super imposed preeclampsia without severe features. Admitted for vaginal bleeding and DIC labs and repeat US reassuring at this time per OB note. She received betamethasone  5/8& 5/9 and is closely being monitored.   We reviewed initial delivery room management, including CPAP, [place], and low but certainly possible need for intubation for surfactant administration.  We discussed feeding immaturity and need for full po intake with multiple days of good weight gain and no apnea or bradycardia before discharge.  We reviewed increased risk of jaundice, infection, and temperature instability.   Discussed likely length of stay. Discussed pumping MBM which she intends to do and the option of using DBM as a bridge until adequate milk supply established.  She is thinking about DBM.  She shared that she had inadequate milk supply with previous infant.  We also discussed possible need for central line placement and indications and risks/ benefits. Should her daughter need a central line she gave verbal consent.   Thank you for allowing [place] to participate in her care.  Please call with questions. Lenola Lockner P. Shelda Altes NNP-BC Neonatal Nurse Practitioner  The total length of face-to-face or floor / unit time for this encounter was 30 minutes.  Counseling and / or coordination of care was greater than fifty percent of the time.

## 2021-02-03 NOTE — Consult Note (Signed)
MFM Consultation  Date of Service: 02/03/21 Reason for request: Third trimester bleeding Requesting provider: Dr. Percival Spanish  Sierra Jordan is a 30 yo G8P1 who is seen as an inpatient due to vaginal bleeding in pregnancy. She is 40 w 6 d today with an EDD of 03/30/21.  She is seen at the request of Dr. Gardiner Rhyme.  She is overall doing well and the pregnancy has been complicated by a history of chronic hypertension managed by Procardia 30 mg XL daily, a history of IUFD at 34 weeks due to a placenta abruption, super imposed preeclampsia without severe features and A2GDM managed with insulin.   Sierra Jordan reports bright red bleeding on Sunday. She denies recent event with exception of intercourse that occurred 24 hours previously. She was admitted to the hospital and had DIC labs drawn and preeclampsia labs were all normal with exception of a PC ratio of 0.74. BMZ was given on 05/08 and 05/09.  She denies uterine contractions. She also reports that the bleeding is not as regular as it was on Sunday with it now being only on her tissue after using the restroom, however it is still bright red.  She has support of her mother but lacks additional childcare of her toddler.  Vitals with BMI 02/03/2021 02/03/2021 02/03/2021  Height - - -  Weight - - -  BMI - - -  Systolic 518 841 660  Diastolic 83 87 69  Pulse 65 56 57   CMP Latest Ref Rng & Units 02/01/2021 01/29/2021 01/27/2021  Glucose 70 - 99 mg/dL 78 83 114(H)  BUN 6 - 20 mg/dL _0 Creatinine 0.44 - 1.00 mg/dL 0.54 0.51(L) 0.66  Sodium 135 - 145 mmol/L 136 136 139  Potassium 3.5 - 5.1 mmol/L 3.7 4.1 4.2  Chloride 98 - 111 mmol/L 107 103 105  CO2 22 - 32 mmol/L 24 19(L) 19(L)  Calcium 8.9 - 10.3 mg/dL 8.7(L) 9.0 8.9  Total Protein 6.5 - 8.1 g/dL 6.3(L) 5.9(L) 6.3  Total Bilirubin 0.3 - 1.2 mg/dL 0.3 <0.2 <0.2  Alkaline Phos 38 - 126 U/L 89 101 108  AST 15 - 41 U/L _1 ALT 0 - 44 U/L _2 Past Medical History:  Diagnosis  Date  . Family history of breast cancer    12/21 cancer genetic testing letter sent  . Gestational diabetes   . Gestational hypertension 11/02/2016  . History of pregnancy induced hypertension 08/26/2020  . Hyperemesis gravidarum with dehydration 04/22/2016  . Hypertension   . Medical history non-contributory   . Preeclampsia in postpartum period 01/19/2019  . Preeclampsia, severe 01/19/2019  . Stillbirth    Past Surgical History:  Procedure Laterality Date  . CESAREAN SECTION N/A 01/13/2019   Procedure: CESAREAN SECTION;  Surgeon: Will Bonnet, MD;  Location: [facility] ORS;  Service: Obstetrics;  Laterality: N/A;  . DILATION AND CURETTAGE OF UTERUS  2008, 2012   D&E EAB  . DILATION AND CURETTAGE OF UTERUS N/A 11/03/2016   Procedure: DILATATION AND CURETTAGE;  Surgeon: Gae Dry, MD;  Location: [facility] ORS;  Service: Gynecology;  Laterality: N/A;   Family History  Problem Relation Age of Onset  . Diabetes Mother   . Hyperlipidemia Mother   . Hypertension Mother   . Depression Mother   . Pancreatitis Mother   . Breast cancer Mother 54  . Diabetes Father   . Breast cancer Maternal Aunt   . Breast cancer Other 40  Social History   Socioeconomic History  . Marital status: Single    Spouse name: Not on file  . Number of children: Not on file  . Years of education: Not on file  . Highest education level: Not on file  Occupational History  . Occupation: Occupational psychologist  Tobacco Use  . Smoking status: Former Smoker    Packs/day: 0.10    Years: 2.00    Pack years: 0.20    Types: Cigarettes, Cigars    Quit date: 11/25/2020    Years since quitting: 0.1  . Smokeless tobacco: Never Used  . Tobacco comment: 2-3 puffs a day Black & Milds  Substance and Sexual Activity  . Alcohol use: No  . Drug use: Not Currently    Types: Marijuana    Comment: Stopped 11/21  . Sexual activity: Yes    Birth control/protection: None  Other Topics Concern  . Not on file  Social History  Narrative  . Not on file   Social Determinants of Health   Financial Resource Strain: Not on file  Food Insecurity: Not on file  Transportation Needs: Not on file  Physical Activity: Not on file  Stress: Not on file  Social Connections: Not on file  Intimate Partner Violence: Not on file   Current Meds  Medication Sig  . Accu-Chek FastClix Lancets MISC Apply topically.  Marland Kitchen acetaminophen (TYLENOL) 325 MG tablet Take 2 tablets (650 mg total) by mouth every 4 (four) hours as needed (for pain scale < 4ORtemperature>/=100.5 F).  Marland Kitchen aspirin 81 MG chewable tablet Chew 81 mg by mouth daily.  . Blood Glucose Monitoring Suppl (ACCU-CHEK GUIDE) w/Device KIT 1 Device by Does not apply route in the morning, at noon, in the evening, and at bedtime.  . ferrous sulfate 325 (65 FE) MG tablet Take 1 tablet (325 mg total) by mouth daily with breakfast.  . insulin glargine (LANTUS) 100 UNIT/ML Solostar Pen Inject 10 Units into the skin at bedtime.  . Insulin Pen Needle 32G X 6 MM MISC 1 Units by Does not apply route daily.  . Lancets Misc. (ACCU-CHEK FASTCLIX LANCET) KIT 1 Device by Does not apply route 4 (four) times daily.  Marland Kitchen NIFEdipine (ADALAT CC) 30 MG 24 hr tablet Take 1 tablet (30 mg total) by mouth daily.  . Prenatal Vit-Fe Fumarate-FA (PRENATAL MULTIVITAMIN) TABS tablet Take 1 tablet by mouth daily at 12 noon.   Imaging: Single intrauterine pregnancy with Biophysical profile 8/8.  There was a heterogenous collection of likely blood clot near the posterior edge of the cervix and also a large anterior 6 x 4 cm collection that was dynamic element of flow and and a posterior vessel. This may be suggest active hemorrhage vs recent blood clot.  Impression/Counseling:  I discussed with Sierra Jordan that it appears that she may have a placental abruption. I defined a placental abruption as a painless vaginal bleeding in the third trimester.   In addition, I reviewed today's images with her in real  time demonstrating the area of clot both posteriorly and anteriorly. I explained the anterior area appears as an accessory lobe however, it was not seen on multiple previous exams and due to its dynamic nature with irregular borders I suggest that it is likely a blood clot.  We reviewed the the management to include betamethasone that she has received, maternal and fetal monitoring and serial labs (including DIC labs) every 24-48 hours while she continues to have evidence of bleeding. Her labs at  this time are normal again with exception of the elevated UPC.  We discussed the etiology may include chronic hypertension and overall general recurrence given her prior history.  Management include remaining inpatient through 34 weeks with daily testing, with delivery for abnormal fetal testing, bleeding and uterine contractions. If she continued to bleed, given her history of IUFD we recommend delivery at 35 weeks. While she remains hospitalized we leave it to the providers discretion regarding delivery if any of the above mentioned indications are present.  Sierra Jordan conveyed that her barrier to hospitalization is child care for her toddler. However, she will try to find assistance in the next 24 hours. I explained that our recommendation is for hospitalization but if this is not feasible intensive outpatient management is an alternative but suboptimal approach as there is a risk for loss of her child if bleeding occurs spontaneously. She conveyed her understanding.  I reviewed this plan of care with Dr. Georgianne Fick who is on call and he agrees with this plan of care.  If she remains stable at 34 weeks we will consider extending pregnancy until 35-36 weeks.  Lastly, continue care protocol for the management of superimposed preeclampsia and A2GDM.   I spent 45 minutes with >50% in face to face consultation and care coordination.  All questions answered.  Vikki Ports, MD

## 2021-02-03 NOTE — Progress Notes (Signed)
Subjective:  No further bleeding episodes other than still seeing some light pink after voiding.  +FM, no LOF, no ctx.  Denies HA or vision changes.    Objective:   Vitals: Blood pressure (!) 143/83, pulse 65, temperature 98.1 F (36.7 C), temperature source Oral, resp. rate 18, height 5\' 2"  (1.575 m), weight 118.8 kg, last menstrual period 06/23/2020, unknown if currently breastfeeding. General: NAD Abdomen: gravid, non-tender Cervical Exam:  Dilation: 1 Effacement (%): 30 Exam by:: Jean RosenthalJackson MD  FHT: 145, moderate, +accels, no decels Toco: non  Results for orders placed or performed during the hospital encounter of 02/01/21 (from the past 24 hour(s))  Glucose, capillary     Status: Abnormal   Collection Time: 02/02/21  1:55 PM  Result Value Ref Range   Glucose-Capillary 110 (H) 70 - 99 mg/dL   Comment 1 Notify RN   Glucose, capillary     Status: Abnormal   Collection Time: 02/02/21  9:14 PM  Result Value Ref Range   Glucose-Capillary 135 (H) 70 - 99 mg/dL  Glucose, capillary     Status: Abnormal   Collection Time: 02/03/21  7:16 AM  Result Value Ref Range   Glucose-Capillary 124 (H) 70 - 99 mg/dL  Glucose, capillary     Status: Abnormal   Collection Time: 02/03/21  9:27 AM  Result Value Ref Range   Glucose-Capillary 127 (H) 70 - 99 mg/dL   US OB Limited  Result Date: 02/01/2021 CLINICAL DATA:  Vaginal bleeding. EXAM: LIMITED OBSTETRIC ULTRASOUND COMPARISON:  None. FINDINGS: Number of Fetuses: 1 Heart Rate:  150 bpm Movement: Yes Presentation: Cephalic Placental Location: Posterior Previa: No Amniotic Fluid (Subjective):  Within normal limits. AFI: 11.0 cm BPD: 7.46 cm 29 w  6 d MATERNAL FINDINGS: Cervix:  Appears closed (3.8 cm in length). Uterus/Adnexae: No abnormality visualized. IMPRESSION: Single, viable intrauterine pregnancy at approximately 29 weeks and 6 days gestation by ultrasound evaluation. This exam is performed on an emergent basis and does not comprehensively  evaluate fetal size, dating, or anatomy; follow-up complete OB US should be considered if further fetal assessment is warranted. Electronically Signed   By: Aram Candelahaddeus  Houston M.D.   On: 02/01/2021 19:17   US MFM OB FOLLOW UP  Result Date: 01/21/2021 ----------------------------------------------------------------------  OBSTETRICS REPORT                        (Signed Final 01/21/2021 07:31 am) ---------------------------------------------------------------------- Patient Info  ID #:       [id number]                          D.O.B.:  18-Dec-1990 (30 yrs)  Name:       Sierra Jordan                  Visit Date: 01/20/2021 03:32 pm ---------------------------------------------------------------------- Performed By  Attending:        Lin Landsmanorenthian Booker      Referred By:       Zipporah PlantsKATELYN VEAL                    MD  Performed By:     Neita CarpMaria Hill RDMS        [facility]                                                              [facility] ---------------------------------------------------------------------- Orders  #  Description                           Code        Ordered By  1  [place] MFM OB FOLLOW UP                   91478.29    KATELYN VEAL ----------------------------------------------------------------------  #  Order #                     Accession #                Episode #  1  [id number]                   [id number]                 [id number] ---------------------------------------------------------------------- Indications  [redacted] weeks gestation of pregnancy                 Z3A.30  Poor obstetric history: Previous IUFD           O09.299  (stillbirth)  Obesity complicating pregnancy, third           O99.213  trimester  Gestational diabetes in pregnancy, insulin      O24.414  controlled ---------------------------------------------------------------------- Fetal Evaluation  Num Of Fetuses:          1  Fetal Heart  Rate(bpm):   157  Cardiac Activity:        Observed  Presentation:            Transverse, head to maternal right  Placenta:                Posterior  P. Cord Insertion:       Previously Visualized  AFI Sum(cm)     %Tile       Largest Pocket(cm)  14.2            48          4.7  RUQ(cm)       RLQ(cm)       LUQ(cm)        LLQ(cm)  2.6           2.7           4.7            4.2 ---------------------------------------------------------------------- Biometry  BPD:        74  mm     G. Age:  29w 5d         24  %    CI:        72.11   %    70 - 86                                                          FL/HC:  21.0  %    19.2 - 21.4  HC:      277.3  mm     G. Age:  30w 2d         20  %    HC/AC:       1.04       0.99 - 1.21  AC:      267.6  mm     G. Age:  30w 6d         67  %    FL/BPD:      78.6  %    71 - 87  FL:       58.2  mm     G. Age:  30w 3d         43  %    FL/AC:       21.7  %    20 - 24  LV:          4  mm  Est. FW:    1600   gm     3 lb 8 oz     53  % ---------------------------------------------------------------------- Gestational Age  LMP:           30w 1d        Date:  06/23/20                 EDD:   03/30/21  U/S Today:     30w 2d                                        EDD:   03/29/21  Best:          30w 1d     Det. By:  LMP  (06/23/20)          EDD:   03/30/21 ---------------------------------------------------------------------- Anatomy  Cranium:               Previously seen        LVOT:                   Previously seen  Cavum:                 Previously seen        Aortic Arch:            Previously seen  Ventricles:            Appears normal         Ductal Arch:            Previously seen  Choroid Plexus:        Previously seen        Diaphragm:              Previously seen  Cerebellum:            Previously seen        Stomach:                Appears normal, left  sided  Posterior Fossa:       Previously seen         Abdomen:                Previously seen  Nuchal Fold:           Not applicable (>20    Abdominal Wall:         Previously seen                         wks GA)  Face:                  Orbits and profile     Cord Vessels:           Previously seen                         previously seen  Lips:                  Previously seen        Kidneys:                Appear normal  Palate:                Previously seen        Bladder:                Appears normal  Thoracic:              Previously seen        Spine:                  Appears normal  Heart:                 Appears normal         Upper Extremities:      Previously seen                         (4CH, axis, and                         situs)  RVOT:                  Previously seen        Lower Extremities:      Previously seen ---------------------------------------------------------------------- Impression  Follow up growth due to maternal A2GDM and prior IUFD at  32 weeks.  Normal interval growth with measurements consistent with  dates  Good fetal movement and amniotic fluid volume ---------------------------------------------------------------------- Recommendations  Initiate weekly testing at 32 weeks.  Repeat growth in 4 weeks. ----------------------------------------------------------------------               Lin Landsman, MD Electronically Signed Final Report   01/21/2021 07:31 am ----------------------------------------------------------------------   Assessment:   30 y.o. Q7Y1950 [redacted]w[redacted]d with preeclampsia without severe features and vaginal bleeding  Plan:   1) Vaginal bleeding - was seen today by MFM.  Spoke with Dr. Grace Bushy, placenta remains posterior but there is a collection of blood noted in the anterior uterus.  Given history of abruption this remains in the differential.   - received BMZ x 2 this admission - recommendation is for continued inpatient observation with plans to delivery at 34 weeks if no other clinical indication for  delivery - 48-hr trending of coagulation parameters - repeat T&S tomorrow  2) Preeclampsia without severe features  - BP remain normotensive to mild range on procardia XR 30mg    3) GDM - continue current dose of lantus with SSI  4) Fetus - category I tracing  , MD, [facility], [facility] 02/03/2021, 12:22 PM

## 2021-02-04 DIAGNOSIS — O4693 Antepartum hemorrhage, unspecified, third trimester: Secondary | ICD-10-CM

## 2021-02-04 DIAGNOSIS — O1403 Mild to moderate pre-eclampsia, third trimester: Secondary | ICD-10-CM

## 2021-02-04 DIAGNOSIS — Z3A32 32 weeks gestation of pregnancy: Secondary | ICD-10-CM

## 2021-02-04 DIAGNOSIS — O24414 Gestational diabetes mellitus in pregnancy, insulin controlled: Secondary | ICD-10-CM

## 2021-02-04 LAB — GLUCOSE, CAPILLARY
Glucose-Capillary: 89 mg/dL (ref 70–99)
Glucose-Capillary: 110 mg/dL — ABNORMAL HIGH (ref 70–99)
Glucose-Capillary: 99 mg/dL (ref 70–99)
Glucose-Capillary: 98 mg/dL (ref 70–99)
Glucose-Capillary: 89 mg/dL (ref 70–99)

## 2021-02-04 LAB — TYPE AND SCREEN
ABO/RH(D): A POS
Antibody Screen: NEGATIVE

## 2021-02-04 MED ORDER — ASPIRIN 81 MG PO CHEW
81.0000 mg | CHEWABLE_TABLET | Freq: Every day | ORAL | Status: DC
  Administered 2021-02-04 – 2021-02-15 (×12): 81 mg via ORAL
  Filled 2021-02-04 (×14): qty 1

## 2021-02-04 MED ORDER — NIFEDIPINE ER OSMOTIC RELEASE 30 MG PO TB24
60.0000 mg | ORAL_TABLET | Freq: Every day | ORAL | Status: DC
  Administered 2021-02-05 – 2021-02-19 (×15): 60 mg via ORAL
  Filled 2021-02-04 (×16): qty 2

## 2021-02-04 NOTE — Progress Notes (Signed)
Pt. Provided milk and a snack, will continue to monitor for reactive NST.

## 2021-02-04 NOTE — Progress Notes (Signed)
Daily Antepartum Note  Admission Date: 02/01/2021 Current Date: 02/04/2021 11:02 AM  Sierra Jordan is a 30 y.o. H4R7408 @ [redacted]w[redacted]d by LMP consistent with 11 wk [place], HD#3, admitted for vaginal bleeding, concern for placental abruption with poor obstetric history.   Pregnancy complicated by:  Patient Active Problem List   Diagnosis Date Noted  . Pregnancy with poor obstetric history   . Vaginal bleeding in pregnancy, third trimester 02/01/2021  . [redacted] weeks gestation of pregnancy 02/01/2021  . Anemia affecting eighth pregnancy 01/24/2021  . Mild preeclampsia, third trimester 01/23/2021  . Insulin controlled gestational diabetes mellitus (GDM) in third trimester 12/08/2020  . History of gestational diabetes 08/26/2020  . BMI 40.0-44.9, adult (HCC) 08/26/2020  . Sickle cell trait (HCC) 08/26/2020  . Pregnancy with poor reproductive history in first trimester 08/26/2020  . Stillbirth with antepartum death January 13, 2019  . Supervision of high-risk pregnancy, third trimester 11/02/2016  . Obesity affecting pregnancy in third trimester 11/02/2016   Overnight/24hr events:  None  Subjective:  Patient is resting comfortably in bed this AM. She denies HA, vision changes, RUQ pain, LOF, or ctx. She reports +FM. Patient denies any additional vaginal bleeding overnight. She does report that she had a small amount of dark brown vaginal discharge following [place] yesterday.  Objective:   Vitals:   02/04/21 0612 02/04/21 0720  BP: (!) 147/81 132/83  Pulse: (!) 57 69  Resp:  18  Temp:     Temp:  [97.8 F (36.6 C)-98.7 F (37.1 C)] 97.8 F (36.6 C) (05/11 0404) Pulse Rate:  [57-71] 69 (05/11 0720) Resp:  [14-18] 18 (05/11 0720) BP: (130-149)/(79-91) 132/83 (05/11 0720) Temp (24hrs), Avg:98.2 F (36.8 C), Min:97.8 F (36.6 C), Max:98.7 F (37.1 C)  No intake or output data in the 24 hours ending 02/04/21 1102   Current Vital Signs 24h Vital Sign Ranges  T 97.8 F (36.6 C) Temp  Avg: 98.2 F (36.8  C)  Min: 97.8 F (36.6 C)  Max: 98.7 F (37.1 C)  BP 132/83 BP  Min: 130/79  Max: 149/84  HR 69 Pulse  Avg: 63.7  Min: 57  Max: 71  RR 18 Resp  Avg: 15.5  Min: 14  Max: 18  SaO2     No data recorded       24 Hour I/O Current Shift I/O  Time Ins Outs No intake/output data recorded. No intake/output data recorded.   Patient Vitals for the past 24 hrs:  BP Temp Temp src Pulse Resp  02/04/21 0720 132/83 -- -- 69 18  02/04/21 0612 (!) 147/81 -- -- (!) 57 --  02/04/21 0519 130/79 -- -- 68 --  02/04/21 0404 133/87 97.8 F (36.6 C) Oral 61 14  02/04/21 0037 136/87 98.2 F (36.8 C) Oral 63 14  02/04/21 0007 138/86 -- -- 64 --  02/03/21 1917 135/85 98.7 F (37.1 C) Oral 62 16  02/03/21 1905 138/86 -- -- 67 --  02/03/21 1805 (!) 149/84 -- -- 60 --  02/03/21 1705 136/90 -- -- 64 --  02/03/21 1605 (!) 141/84 -- -- 71 --  02/03/21 1505 (!) 147/89 -- -- (!) 57 --  02/03/21 1343 (!) 133/91 -- -- 70 --  02/03/21 1233 (!) 140/91 -- -- 64 --  02/03/21 1130 -- -- -- (!) 58 --    Physical exam: General: Well nourished, well developed female in no acute distress. Abdomen: gravid, non-tender Cardiovascular: S1, S2 normal, no murmur, rub or gallop, regular rate and rhythm  Respiratory: CTAB Extremities: no clubbing, cyanosis or edema Skin: Warm and dry.   Medications: Current Facility-Administered Medications  Medication Dose Route Frequency Provider Last Rate Last Admin  . acetaminophen (TYLENOL) tablet 650 mg  650 mg Oral Q4H PRN Mirna Mires, CNM   650 mg at 02/04/21 0033  . aspirin chewable tablet 81 mg  81 mg Oral Daily Zipporah Plants, CNM   81 mg at 02/04/21 1022  . dextrose 5 % in lactated ringers infusion   Intravenous Continuous Conard Novak, MD   Stopped at 02/02/21 2128  . labetalol (NORMODYNE) injection 20 mg  20 mg Intravenous PRN Tresea Mall, CNM       And  . labetalol (NORMODYNE) injection 40 mg  40 mg Intravenous PRN Tresea Mall, CNM       And  . labetalol  (NORMODYNE) injection 80 mg  80 mg Intravenous PRN Tresea Mall, CNM       And  . hydrALAZINE (APRESOLINE) injection 10 mg  10 mg Intravenous PRN Tresea Mall, CNM      . insulin aspart (novoLOG) injection 0-16 Units  0-16 Units Subcutaneous TID PC Conard Novak, MD   2 Units at 02/03/21 1942  . insulin detemir (LEVEMIR) injection 5 Units  5 Units Subcutaneous BID Conard Novak, MD   5 Units at 02/03/21 2120  . NIFEdipine (PROCARDIA-XL/NIFEDICAL-XL) 24 hr tablet 30 mg  30 mg Oral Daily Tresea Mall, CNM   30 mg at 02/04/21 6440    Labs:  Recent Labs  Lab 01/29/21 1406 02/01/21 1848 02/03/21 1250  WBC 7.1 7.1 11.1*  HGB 10.6* 10.3* 10.3*  HCT 32.2* 29.6* 29.8*  PLT 304 334 347    Recent Labs  Lab 01/29/21 1406 02/01/21 1848  NA 136 136  K 4.1 3.7  CL 103 107  CO2 19* 24  BUN 12 15  CREATININE 0.51* 0.54  CALCIUM 9.0 8.7*  PROT 5.9* 6.3*  BILITOT <0.2 0.3  ALKPHOS 101 89  ALT 18 16  AST 12 16  GLUCOSE 83 78    Assessment & Plan:  30 yo G8P1151 at [redacted]w[redacted]d with preeclampsia without severe features and vaginal bleeding  1) Vaginal bleeding - stable - no further acute bleeding noted at this time -Will continue inpatient observation with plan for delivery at 34 weeks if no other clinical indication for delivery according to MFM recs -T&S collected today -Coags wnl yesterday on recheck  2) Preeclampsia without severe features -Continue procardia XL 30 mg - BP normotensive to mild range  3) GDM - lantus with SSI  4) Fetal well-being: reactive NST  5) SCDs for VTE PP    Zipporah Plants, CNM [facility], [facility] 02/04/2021 08:52 AM

## 2021-02-05 ENCOUNTER — Encounter: Payer: Medicaid Other | Admitting: Obstetrics and Gynecology

## 2021-02-05 DIAGNOSIS — O4693 Antepartum hemorrhage, unspecified, third trimester: Secondary | ICD-10-CM

## 2021-02-05 DIAGNOSIS — O24414 Gestational diabetes mellitus in pregnancy, insulin controlled: Secondary | ICD-10-CM | POA: Diagnosis not present

## 2021-02-05 DIAGNOSIS — Z3A32 32 weeks gestation of pregnancy: Secondary | ICD-10-CM | POA: Diagnosis not present

## 2021-02-05 DIAGNOSIS — O1403 Mild to moderate pre-eclampsia, third trimester: Secondary | ICD-10-CM | POA: Diagnosis not present

## 2021-02-05 LAB — COMPREHENSIVE METABOLIC PANEL
ALT: 18 U/L (ref 0–44)
AST: 15 U/L (ref 15–41)
Albumin: 2.6 g/dL — ABNORMAL LOW (ref 3.5–5.0)
Alkaline Phosphatase: 82 U/L (ref 38–126)
Anion gap: 9 (ref 5–15)
BUN: 17 mg/dL (ref 6–20)
CO2: 23 mmol/L (ref 22–32)
Calcium: 8.6 mg/dL — ABNORMAL LOW (ref 8.9–10.3)
Chloride: 104 mmol/L (ref 98–111)
Creatinine, Ser: 0.57 mg/dL (ref 0.44–1.00)
GFR, Estimated: >60 mL/min (ref >60)
Glucose, Bld: 93 mg/dL (ref 70–99)
Potassium: 3.6 mmol/L (ref 3.5–5.1)
Sodium: 136 mmol/L (ref 135–145)
Total Bilirubin: 0.4 mg/dL (ref 0.3–1.2)
Total Protein: 6.2 g/dL — ABNORMAL LOW (ref 6.5–8.1)

## 2021-02-05 LAB — GLUCOSE, CAPILLARY
Glucose-Capillary: 83 mg/dL (ref 70–99)
Glucose-Capillary: 135 mg/dL — ABNORMAL HIGH (ref 70–99)
Glucose-Capillary: 87 mg/dL (ref 70–99)
Glucose-Capillary: 96 mg/dL (ref 70–99)
Glucose-Capillary: 100 mg/dL — ABNORMAL HIGH (ref 70–99)

## 2021-02-05 LAB — CBC
HCT: 30.9 % — ABNORMAL LOW (ref 36.0–46.0)
Hemoglobin: 10.8 g/dL — ABNORMAL LOW (ref 12.0–15.0)
MCH: 27.6 pg (ref 26.0–34.0)
MCHC: 35 g/dL (ref 30.0–36.0)
MCV: 79 fL — ABNORMAL LOW (ref 80.0–100.0)
Platelets: 323 10*3/uL (ref 150–400)
RBC: 3.91 MIL/uL (ref 3.87–5.11)
RDW: 13.4 % (ref 11.5–15.5)
WBC: 11.7 10*3/uL — ABNORMAL HIGH (ref 4.0–10.5)
nRBC: 0 % (ref 0.0–0.2)

## 2021-02-05 LAB — PROTIME-INR
INR: 0.9 (ref 0.8–1.2)
Prothrombin Time: 12.3 seconds (ref 11.4–15.2)

## 2021-02-05 LAB — FIBRINOGEN: Fibrinogen: 516 mg/dL — ABNORMAL HIGH (ref 210–475)

## 2021-02-05 LAB — APTT: aPTT: 28 seconds (ref 24–36)

## 2021-02-05 NOTE — Progress Notes (Signed)
Daily Antepartum Note  Admission Date: 02/01/2021 Current Date: 02/05/2021 12:14 PM  Sierra Jordan is a 30 y.o. [id number] @ [redacted]w[redacted]d by LMP consistent with 11 wk [place], HD#3, admitted for vaginal bleeding, concern for placental abruption with poor obstetric history.   Pregnancy complicated by:  Patient Active Problem List   Diagnosis Date Noted  . Pregnancy with poor obstetric history   . Vaginal bleeding in pregnancy, third trimester 02/01/2021  . [redacted] weeks gestation of pregnancy 02/01/2021  . Anemia affecting eighth pregnancy 01/24/2021  . Mild preeclampsia, third trimester 01/23/2021  . Insulin controlled gestational diabetes mellitus (GDM) in third trimester 12/08/2020  . History of gestational diabetes 08/26/2020  . BMI 40.0-44.9, adult (HCC) 08/26/2020  . Sickle cell trait (HCC) 08/26/2020  . Pregnancy with poor reproductive history in first trimester 08/26/2020  . Stillbirth with antepartum death 01/31/19  . Supervision of high-risk pregnancy, third trimester 11/02/2016  . Obesity affecting pregnancy in third trimester 11/02/2016   Overnight/24hr events:  None  Subjective:  She has just had a shower. She has no complaints, no headache, visual changes, epigastric pain. She continues to have light pink spotting with wiping. She denies leakage of fluid. She reports good fetal movement.  Objective:   Vitals:   02/05/21 0946 02/05/21 1119  BP: (!) 157/90 (!) 144/94  Pulse: 69 76  Resp:    Temp:     Temp:  [97.8 F (36.6 C)-98.6 F (37 C)] 98.6 F (37 C) (05/12 0617) Pulse Rate:  [58-76] 76 (05/12 1119) Resp:  [14-16] 14 (05/12 0617) BP: (123-157)/(69-101) 144/94 (05/12 1119) Temp (24hrs), Avg:98.3 F (36.8 C), Min:97.8 F (36.6 C), Max:98.6 F (37 C)  No intake or output data in the 24 hours ending 02/05/21 1214   Current Vital Signs 24h Vital Sign Ranges  T 98.6 F (37 C) Temp  Avg: 98.3 F (36.8 C)  Min: 97.8 F (36.6 C)  Max: 98.6 F (37 C)  BP (!) 144/94 BP   Min: 123/95  Max: 157/90  HR 76 Pulse  Avg: 66.7  Min: 58  Max: 76  RR 14 Resp  Avg: 14.5  Min: 14  Max: 16  SaO2     No data recorded       24 Hour I/O Current Shift I/O  Time Ins Outs No intake/output data recorded. No intake/output data recorded.   Patient Vitals for the past 24 hrs:  BP Temp Temp src Pulse Resp  02/05/21 1119 (!) 144/94 -- -- 76 --  02/05/21 0946 (!) 157/90 -- -- 69 --  02/05/21 0740 (!) 133/92 -- -- (!) 58 --  02/05/21 0638 126/85 -- -- 71 --  02/05/21 0617 -- 98.6 F (37 C) Oral -- 14  02/05/21 0538 (!) 123/95 -- -- 64 --  02/05/21 0438 138/69 98.5 F (36.9 C) Oral 76 14  02/05/21 0338 131/71 -- -- (!) 58 --  02/05/21 0238 137/80 -- -- 68 --  02/05/21 0138 (!) 141/88 -- -- 65 --  02/05/21 0038 139/82 98.2 F (36.8 C) Oral 60 14  02/04/21 2338 (!) 145/93 -- -- 68 --  02/04/21 2238 (!) 151/85 -- -- 64 --  02/04/21 2134 (!) 141/97 -- -- 74 --  02/04/21 2030 (!) 150/91 -- -- 70 --  02/04/21 2008 (!) 148/91 -- -- 66 --  02/04/21 1904 (!) 134/92 97.8 F (36.6 C) Oral 68 16  02/04/21 1846 (!) 146/101 -- -- 67 --  02/04/21 1615 (!) 151/86 -- -- 64 --  02/04/21 1318 (!) 147/79 -- -- 61 --    Physical exam: General: Well nourished, well developed female in no acute distress. Abdomen: gravid, non-tender Cardiovascular: regular rate and rhythm Respiratory: CTAB Extremities: no clubbing, cyanosis or edema Skin: Warm and dry.   Medications: Current Facility-Administered Medications  Medication Dose Route Frequency Provider Last Rate Last Admin  . acetaminophen (TYLENOL) tablet 650 mg  650 mg Oral Q4H PRN Mirna Mires, CNM   650 mg at 02/04/21 0033  . aspirin chewable tablet 81 mg  81 mg Oral Daily Zipporah Plants, CNM   81 mg at 02/05/21 1007  . dextrose 5 % in lactated ringers infusion   Intravenous Continuous Conard Novak, MD   Stopped at 02/02/21 2128  . labetalol (NORMODYNE) injection 20 mg  20 mg Intravenous PRN Tresea Mall, CNM       And   . labetalol (NORMODYNE) injection 40 mg  40 mg Intravenous PRN Tresea Mall, CNM       And  . labetalol (NORMODYNE) injection 80 mg  80 mg Intravenous PRN Tresea Mall, CNM       And  . hydrALAZINE (APRESOLINE) injection 10 mg  10 mg Intravenous PRN Tresea Mall, CNM      . insulin aspart (novoLOG) injection 0-16 Units  0-16 Units Subcutaneous TID PC Conard Novak, MD   3 Units at 02/05/21 1005  . insulin detemir (LEVEMIR) injection 5 Units  5 Units Subcutaneous BID Conard Novak, MD   5 Units at 02/05/21 1004  . NIFEdipine (PROCARDIA-XL/NIFEDICAL-XL) 24 hr tablet 60 mg  60 mg Oral Daily Zipporah Plants, CNM   60 mg at 02/05/21 0945    Labs:  Recent Labs  Lab 02/01/21 1848 02/03/21 1250 02/05/21 0517  WBC 7.1 11.1* 11.7*  HGB 10.3* 10.3* 10.8*  HCT 29.6* 29.8* 30.9*  PLT 334 347 323    Recent Labs  Lab 01/29/21 1406 02/01/21 1848 02/05/21 0517  NA 136 136 136  K 4.1 3.7 3.6  CL 103 107 104  CO2 19* 24 23  BUN 12 15 17   CREATININE 0.51* 0.54 0.57  CALCIUM 9.0 8.7* 8.6*  PROT 5.9* 6.3* 6.2*  BILITOT <0.2 0.3 0.4  ALKPHOS 101 89 82  ALT 18 16 18   AST 12 16 15   GLUCOSE 83 78 93    Assessment & Plan:  30 yo [id number] at [redacted]w[redacted]d with preeclampsia without severe features and vaginal bleeding  1) Vaginal bleeding - stable - scant pink with wiping -Will continue inpatient observation with plan for delivery at 34 weeks if no other clinical indication for delivery according to MFM recs  -Coags stable  2) Preeclampsia without severe features -Procardia increased to 60 mg XL - BP normotensive to mild range  3) GDM - lantus with SSI  4) Fetal well-being: reactive NST  5) SCDs for VTE PP    , CNM [facility] 02/05/2021 08:52 AM

## 2021-02-06 DIAGNOSIS — O1403 Mild to moderate pre-eclampsia, third trimester: Secondary | ICD-10-CM | POA: Diagnosis not present

## 2021-02-06 DIAGNOSIS — O4593 Premature separation of placenta, unspecified, third trimester: Secondary | ICD-10-CM | POA: Diagnosis not present

## 2021-02-06 DIAGNOSIS — O24414 Gestational diabetes mellitus in pregnancy, insulin controlled: Secondary | ICD-10-CM | POA: Diagnosis not present

## 2021-02-06 DIAGNOSIS — Z3A32 32 weeks gestation of pregnancy: Secondary | ICD-10-CM | POA: Diagnosis not present

## 2021-02-06 LAB — GLUCOSE, CAPILLARY
Glucose-Capillary: 79 mg/dL (ref 70–99)
Glucose-Capillary: 139 mg/dL — ABNORMAL HIGH (ref 70–99)
Glucose-Capillary: 101 mg/dL — ABNORMAL HIGH (ref 70–99)
Glucose-Capillary: 117 mg/dL — ABNORMAL HIGH (ref 70–99)

## 2021-02-06 NOTE — Progress Notes (Signed)
Daily Antepartum Note  Admission Date: 02/01/2021 Current Date: 02/06/2021 10:12 AM  Sierra Jordan is a 30 y.o. [id number] @ [redacted]w[redacted]d by LMP consistent with 11 wk [place], HD#4, admitted for vaginal bleeding, concern for placental abruption with poor obstetric history.   Pregnancy complicated by:  Patient Active Problem List   Diagnosis Date Noted  . Pregnancy with poor obstetric history   . Vaginal bleeding in pregnancy, third trimester 02/01/2021  . [redacted] weeks gestation of pregnancy 02/01/2021  . Anemia affecting eighth pregnancy 01/24/2021  . Mild preeclampsia, third trimester 01/23/2021  . Insulin controlled gestational diabetes mellitus (GDM) in third trimester 12/08/2020  . History of gestational diabetes 08/26/2020  . BMI 40.0-44.9, adult (HCC) 08/26/2020  . Sickle cell trait (HCC) 08/26/2020  . Pregnancy with poor reproductive history in first trimester 08/26/2020  . Stillbirth with antepartum death 02/03/2019  . Supervision of high-risk pregnancy, third trimester 11/02/2016  . Obesity affecting pregnancy in third trimester 11/02/2016   Overnight/24hr events:  None  Subjective:  She is resting in bed. Had a headache earlier that resolved with tylenol. She denies visual changes or epigastric pain. She reports vaginal bleeding is now brown with wiping. She denies leakage of fluid or contractions. She reports good fetal movement.   Objective:   Vitals:   02/06/21 0600 02/06/21 0612  BP:  138/86  Pulse:  68  Resp:    Temp: 98.1 F (36.7 C)    Temp:  [98.1 F (36.7 C)-98.3 F (36.8 C)] 98.1 F (36.7 C) (05/13 0600) Pulse Rate:  [58-99] 68 (05/13 0612) Resp:  [14-16] 14 (05/13 0004) BP: (129-145)/(67-109) 138/86 (05/13 0612) Temp (24hrs), Avg:98.2 F (36.8 C), Min:98.1 F (36.7 C), Max:98.3 F (36.8 C)  No intake or output data in the 24 hours ending 02/06/21 1012   Current Vital Signs 24h Vital Sign Ranges  T 98.1 F (36.7 C) Temp  Avg: 98.2 F (36.8 C)  Min: 98.1 F  (36.7 C)  Max: 98.3 F (36.8 C)  BP 138/86 BP  Min: 129/67  Max: 145/97  HR 68 Pulse  Avg: 77  Min: 58  Max: 99  RR 14 Resp  Avg: 15  Min: 14  Max: 16  SaO2     No data recorded       24 Hour I/O Current Shift I/O  Time Ins Outs No intake/output data recorded. No intake/output data recorded.   Patient Vitals for the past 24 hrs:  BP Temp Temp src Pulse Resp  02/06/21 0612 138/86 -- -- 68 --  02/06/21 0600 -- 98.1 F (36.7 C) Oral -- --  02/06/21 0512 135/88 -- -- 66 --  02/06/21 0404 135/88 -- -- 68 --  02/06/21 0304 129/67 -- -- (!) 58 --  02/06/21 0204 (!) 144/80 -- -- 73 --  02/06/21 0104 (!) 145/97 -- -- 67 --  02/06/21 0004 (!) 143/97 98.1 F (36.7 C) Oral 70 14  02/05/21 2300 (!) 143/97 -- -- 78 --  02/05/21 2204 (!) 139/109 -- -- 92 --  02/05/21 2104 (!) 135/106 -- -- 99 --  02/05/21 2004 (!) 143/85 98.3 F (36.8 C) Oral 79 16  02/05/21 1840 139/87 -- -- 85 --  02/05/21 1740 139/88 -- -- 78 --  02/05/21 1640 135/80 -- -- 81 --  02/05/21 1440 (!) 140/93 -- -- 75 --  02/05/21 1340 (!) 143/82 -- -- 83 --  02/05/21 1238 (!) 142/95 -- -- 90 --  02/05/21 1119 (!) 144/94 -- --  76 --    Physical exam: General: Well nourished, well developed female in no acute distress. Abdomen: gravid, non-tender Cardiovascular: regular rate and rhythm Respiratory: CTAB Extremities: no clubbing, cyanosis or edema Skin: Warm and dry.  Fetal well being: 140 bpm, moderate variability, +accelerations, 2 variables Toco: negative   Medications: Current Facility-Administered Medications  Medication Dose Route Frequency Provider Last Rate Last Admin  . acetaminophen (TYLENOL) tablet 650 mg  650 mg Oral Q4H PRN Mirna Mires, CNM   650 mg at 02/06/21 0730  . aspirin chewable tablet 81 mg  81 mg Oral Daily Zipporah Plants, CNM   81 mg at 02/05/21 1007  . dextrose 5 % in lactated ringers infusion   Intravenous Continuous Conard Novak, MD   Stopped at 02/02/21 2128  . labetalol  (NORMODYNE) injection 20 mg  20 mg Intravenous PRN Tresea Mall, CNM       And  . labetalol (NORMODYNE) injection 40 mg  40 mg Intravenous PRN Tresea Mall, CNM       And  . labetalol (NORMODYNE) injection 80 mg  80 mg Intravenous PRN Tresea Mall, CNM       And  . hydrALAZINE (APRESOLINE) injection 10 mg  10 mg Intravenous PRN Tresea Mall, CNM      . insulin aspart (novoLOG) injection 0-16 Units  0-16 Units Subcutaneous TID PC Conard Novak, MD   3 Units at 02/06/21 1010  . insulin detemir (LEVEMIR) injection 5 Units  5 Units Subcutaneous BID Conard Novak, MD   5 Units at 02/06/21 1010  . NIFEdipine (PROCARDIA-XL/NIFEDICAL-XL) 24 hr tablet 60 mg  60 mg Oral Daily Zipporah Plants, CNM   60 mg at 02/06/21 1010    Labs:  Recent Labs  Lab 02/01/21 1848 02/03/21 1250 02/05/21 0517  WBC 7.1 11.1* 11.7*  HGB 10.3* 10.3* 10.8*  HCT 29.6* 29.8* 30.9*  PLT 334 347 323    Recent Labs  Lab 02/01/21 1848 02/05/21 0517  NA 136 136  K 3.7 3.6  CL 107 104  CO2 24 23  BUN 15 17  CREATININE 0.54 0.57  CALCIUM 8.7* 8.6*  PROT 6.3* 6.2*  BILITOT 0.3 0.4  ALKPHOS 89 82  ALT 16 18  AST 16 15  GLUCOSE 78 93    Assessment & Plan:  30 yo [id number] at [redacted]w[redacted]d with preeclampsia without severe features and vaginal bleeding  1) Vaginal bleeding - stable - brown with wiping -Will continue inpatient observation with plan for delivery at 34 weeks if no other clinical indication for delivery according to MFM recs  -PIH labs last drawn yesterday- stable  2) Preeclampsia without severe features -Procardia increased to 60 mg XL - BP normotensive to mild range  3) GDM - lantus with SSI/carb modified diet  4) Fetal well-being: reactive NST  5) SCDs for VTE PP    Tresea Mall, CNM [facility] 02/06/2021 10:15 AM

## 2021-02-07 DIAGNOSIS — O4693 Antepartum hemorrhage, unspecified, third trimester: Secondary | ICD-10-CM | POA: Diagnosis not present

## 2021-02-07 DIAGNOSIS — O24414 Gestational diabetes mellitus in pregnancy, insulin controlled: Secondary | ICD-10-CM | POA: Diagnosis not present

## 2021-02-07 DIAGNOSIS — O09293 Supervision of pregnancy with other poor reproductive or obstetric history, third trimester: Secondary | ICD-10-CM

## 2021-02-07 DIAGNOSIS — O1403 Mild to moderate pre-eclampsia, third trimester: Secondary | ICD-10-CM | POA: Diagnosis not present

## 2021-02-07 LAB — COMPREHENSIVE METABOLIC PANEL
ALT: 22 U/L (ref 0–44)
AST: 19 U/L (ref 15–41)
Albumin: 2.6 g/dL — ABNORMAL LOW (ref 3.5–5.0)
Alkaline Phosphatase: 87 U/L (ref 38–126)
Anion gap: 8 (ref 5–15)
BUN: 14 mg/dL (ref 6–20)
CO2: 21 mmol/L — ABNORMAL LOW (ref 22–32)
Calcium: 8.9 mg/dL (ref 8.9–10.3)
Chloride: 105 mmol/L (ref 98–111)
Creatinine, Ser: 0.5 mg/dL (ref 0.44–1.00)
GFR, Estimated: >60 mL/min (ref >60)
Glucose, Bld: 122 mg/dL — ABNORMAL HIGH (ref 70–99)
Potassium: 3.4 mmol/L — ABNORMAL LOW (ref 3.5–5.1)
Sodium: 134 mmol/L — ABNORMAL LOW (ref 135–145)
Total Bilirubin: 0.4 mg/dL (ref 0.3–1.2)
Total Protein: 6.2 g/dL — ABNORMAL LOW (ref 6.5–8.1)

## 2021-02-07 LAB — TYPE AND SCREEN
ABO/RH(D): A POS
Antibody Screen: NEGATIVE

## 2021-02-07 LAB — GLUCOSE, CAPILLARY
Glucose-Capillary: 80 mg/dL (ref 70–99)
Glucose-Capillary: 129 mg/dL — ABNORMAL HIGH (ref 70–99)
Glucose-Capillary: 92 mg/dL (ref 70–99)
Glucose-Capillary: 99 mg/dL (ref 70–99)

## 2021-02-07 LAB — KLEIHAUER-BETKE STAIN
Fetal Cells %: 0 %
Quantitation Fetal Hemoglobin: 0 mL

## 2021-02-07 LAB — CBC
HCT: 31.1 % — ABNORMAL LOW (ref 36.0–46.0)
Hemoglobin: 10.9 g/dL — ABNORMAL LOW (ref 12.0–15.0)
MCH: 27.5 pg (ref 26.0–34.0)
MCHC: 35 g/dL (ref 30.0–36.0)
MCV: 78.5 fL — ABNORMAL LOW (ref 80.0–100.0)
Platelets: 311 10*3/uL (ref 150–400)
RBC: 3.96 MIL/uL (ref 3.87–5.11)
RDW: 13.3 % (ref 11.5–15.5)
WBC: 10.3 10*3/uL (ref 4.0–10.5)
nRBC: 0 % (ref 0.0–0.2)

## 2021-02-07 LAB — PROTIME-INR
INR: 1 (ref 0.8–1.2)
Prothrombin Time: 12.8 seconds (ref 11.4–15.2)

## 2021-02-07 LAB — FIBRINOGEN: Fibrinogen: 602 mg/dL — ABNORMAL HIGH (ref 210–475)

## 2021-02-07 LAB — APTT: aPTT: 31 seconds (ref 24–36)

## 2021-02-07 NOTE — Progress Notes (Signed)
Subjective:   Reports small pink when she wipes which is stable and not a change.  Denies headache or vision changes.  Denies RUQ pain.  Reports that staying in the hospital has been challenging for child care, but also her daughter is struggling with the separation which is stressful for her as a parent.    Objective:   Blood pressure (!) 144/98, pulse 81, temperature 98 F (36.7 C), temperature source Oral, resp. rate 16, height 5\' 2"  (1.575 m), weight 118.8 kg, last menstrual period 06/23/2020, unknown if currently breastfeeding.  General: NAD Pulmonary: no increased work of breathing Abdomen: non-distended, non-tender Extremities: no edema, no erythema, no tenderness, no signs of DVT  Results for orders placed or performed during the hospital encounter of 02/01/21 (from the past 72 hour(s))  Glucose, capillary     Status: Abnormal   Collection Time: 02/04/21  2:52 PM  Result Value Ref Range   Glucose-Capillary 110 (H) 70 - 99 mg/dL    Comment: Glucose reference range applies only to samples taken after fasting for at least 8 hours.  Glucose, capillary     Status: None   Collection Time: 02/04/21  6:42 PM  Result Value Ref Range   Glucose-Capillary 99 70 - 99 mg/dL    Comment: Glucose reference range applies only to samples taken after fasting for at least 8 hours.  Glucose, capillary     Status: None   Collection Time: 02/04/21  8:07 PM  Result Value Ref Range   Glucose-Capillary 98 70 - 99 mg/dL    Comment: Glucose reference range applies only to samples taken after fasting for at least 8 hours.  Glucose, capillary     Status: None   Collection Time: 02/04/21 10:04 PM  Result Value Ref Range   Glucose-Capillary 89 70 - 99 mg/dL    Comment: Glucose reference range applies only to samples taken after fasting for at least 8 hours.  Fibrinogen     Status: Abnormal   Collection Time: 02/05/21  5:17 AM  Result Value Ref Range   Fibrinogen 516 (H) 210 - 475 mg/dL    Comment:  Performed at [facility], [address] [place], [place] [address]  APTT     Status: None   Collection Time: 02/05/21  5:17 AM  Result Value Ref Range   aPTT 28 24 - 36 seconds    Comment: Performed at [facility], [address] [place], [place] [address]  Protime-INR     Status: None   Collection Time: 02/05/21  5:17 AM  Result Value Ref Range   Prothrombin Time 12.3 11.4 - 15.2 seconds   INR 0.9 0.8 - 1.2    Comment: (NOTE) INR goal varies based on device and disease states. Performed at [facility], [address] [place], [place] [address]   CBC     Status: Abnormal   Collection Time: 02/05/21  5:17 AM  Result Value Ref Range   WBC 11.7 (H) 4.0 - 10.5 K/uL   RBC 3.91 3.87 - 5.11 MIL/uL   Hemoglobin 10.8 (L) 12.0 - 15.0 g/dL   HCT 91.430.9 (L) 78.236.0 - 95.646.0 %   MCV 79.0 (L) 80.0 - 100.0 fL   MCH 27.6 26.0 - 34.0 pg   MCHC 35.0 30.0 - 36.0 g/dL   RDW 21.313.4 08.611.5 - 57.815.5 %   Platelets 323 150 - 400 K/uL   nRBC 0.0 0.0 - 0.2 %    Comment: Performed at [facility], [address], [place], [place] [address]  Comprehensive metabolic panel     Status: Abnormal   Collection Time: 02/05/21  5:17 AM  Result Value Ref Range   Sodium 136 135 - 145 mmol/L   Potassium 3.6 3.5 - 5.1 mmol/L   Chloride 104 98 - 111 mmol/L   CO2 23 22 - 32 mmol/L   Glucose, Bld 93 70 - 99 mg/dL    Comment: Glucose reference range applies only to samples taken after fasting for at least 8 hours.   BUN 17 6 - 20 mg/dL   Creatinine, Ser 4.26 0.44 - 1.00 mg/dL   Calcium 8.6 (L) 8.9 - 10.3 mg/dL   Total Protein 6.2 (L) 6.5 - 8.1 g/dL   Albumin 2.6 (L) 3.5 - 5.0 g/dL   AST 15 15 - 41 U/L   ALT 18 0 - 44 U/L   Alkaline Phosphatase 82 38 - 126 U/L   Total Bilirubin 0.4 0.3 - 1.2 mg/dL   GFR, Estimated >83 >41 mL/min    Comment: (NOTE) Calculated using the CKD-EPI Creatinine Equation (2021)    Anion gap 9 5 - 15    Comment: Performed at [facility], [address] [place], [place] [address]  Glucose, capillary     Status: None   Collection Time: 02/05/21  8:21 AM  Result Value Ref Range   Glucose-Capillary 83 70 - 99 mg/dL    Comment: Glucose reference range applies only to samples taken after fasting for at least 8 hours.  Glucose, capillary     Status: Abnormal   Collection Time: 02/05/21  9:55 AM  Result Value Ref Range   Glucose-Capillary 135 (H) 70 - 99 mg/dL    Comment: Glucose reference range applies only to samples taken after fasting for at least 8 hours.  Glucose, capillary     Status: None   Collection Time: 02/05/21  3:26 PM  Result Value Ref Range   Glucose-Capillary 87 70 - 99 mg/dL    Comment: Glucose reference range applies only to samples taken after fasting for at least 8 hours.  Glucose, capillary     Status: None   Collection Time: 02/05/21  8:25 PM  Result Value Ref Range   Glucose-Capillary 96 70 - 99 mg/dL    Comment: Glucose reference range applies only to samples taken after fasting for at least 8 hours.  Glucose, capillary     Status: Abnormal   Collection Time: 02/05/21 10:18 PM  Result Value Ref Range   Glucose-Capillary 100 (H) 70 - 99 mg/dL    Comment: Glucose reference range applies only to samples taken after fasting for at least 8 hours.  Glucose, capillary     Status: None   Collection Time: 02/06/21  7:34 AM  Result Value Ref Range   Glucose-Capillary 79 70 - 99 mg/dL    Comment: Glucose reference range applies only to samples taken after fasting for at least 8 hours.  Glucose, capillary     Status: Abnormal   Collection Time: 02/06/21 10:01 AM  Result Value Ref Range   Glucose-Capillary 139 (H) 70 - 99 mg/dL    Comment: Glucose reference range applies only to samples taken after fasting for at least 8 hours.  Glucose, capillary     Status: Abnormal   Collection Time: 02/06/21  3:10 PM  Result Value Ref Range   Glucose-Capillary 101 (H) 70 - 99 mg/dL    Comment: Glucose reference range  applies  only to samples taken after fasting for at least 8 hours.  Glucose, capillary     Status: Abnormal   Collection Time: 02/06/21  8:13 PM  Result Value Ref Range   Glucose-Capillary 117 (H) 70 - 99 mg/dL    Comment: Glucose reference range applies only to samples taken after fasting for at least 8 hours.  Type and screen     Status: None   Collection Time: 02/07/21  5:12 AM  Result Value Ref Range   ABO/RH(D) A POS    Antibody Screen NEG    Sample Expiration      02/10/2021,2359 Performed at [facility], [address] [place], [place] [address]   CBC     Status: Abnormal   Collection Time: 02/07/21  5:12 AM  Result Value Ref Range   WBC 10.3 4.0 - 10.5 K/uL   RBC 3.96 3.87 - 5.11 MIL/uL   Hemoglobin 10.9 (L) 12.0 - 15.0 g/dL   HCT 81.4 (L) 48.1 - 85.6 %   MCV 78.5 (L) 80.0 - 100.0 fL   MCH 27.5 26.0 - 34.0 pg   MCHC 35.0 30.0 - 36.0 g/dL   RDW 31.4 97.0 - 26.3 %   Platelets 311 150 - 400 K/uL   nRBC 0.0 0.0 - 0.2 %    Comment: Performed at [facility], [address] [place], [place] [address]  Comprehensive metabolic panel     Status: Abnormal   Collection Time: 02/07/21  5:12 AM  Result Value Ref Range   Sodium 134 (L) 135 - 145 mmol/L   Potassium 3.4 (L) 3.5 - 5.1 mmol/L   Chloride 105 98 - 111 mmol/L   CO2 21 (L) 22 - 32 mmol/L   Glucose, Bld 122 (H) 70 - 99 mg/dL    Comment: Glucose reference range applies only to samples taken after fasting for at least 8 hours.   BUN 14 6 - 20 mg/dL   Creatinine, Ser 5.02 0.44 - 1.00 mg/dL   Calcium 8.9 8.9 - 77.4 mg/dL   Total Protein 6.2 (L) 6.5 - 8.1 g/dL   Albumin 2.6 (L) 3.5 - 5.0 g/dL   AST 19 15 - 41 U/L   ALT 22 0 - 44 U/L   Alkaline Phosphatase 87 38 - 126 U/L   Total Bilirubin 0.4 0.3 - 1.2 mg/dL   GFR, Estimated >12 >87 mL/min    Comment: (NOTE) Calculated using the CKD-EPI Creatinine Equation (2021)    Anion gap 8 5 - 15    Comment: Performed at [facility], [address] [place], [place] [address]  Fibrinogen     Status: Abnormal   Collection Time: 02/07/21  5:12 AM  Result Value Ref Range   Fibrinogen 602 (H) 210 - 475 mg/dL    Comment: Performed at [facility], [address] [place], [place] [address]  Protime-INR     Status: None   Collection Time: 02/07/21  5:12 AM  Result Value Ref Range   Prothrombin Time 12.8 11.4 - 15.2 seconds   INR 1.0 0.8 - 1.2    Comment: (NOTE) INR goal varies based on device and disease states. Performed at [facility], [address] [place], [place] [address]   APTT     Status: None   Collection Time: 02/07/21  5:12 AM  Result Value Ref Range   aPTT 31 24 - 36 seconds    Comment: Performed at [facility], [address] [place], [place] [address]  Kleihauer-Betke stain     Status: None   Collection Time: 02/07/21  5:12 AM  Result Value Ref Range   Fetal Cells % 0 %   Quantitation Fetal Hemoglobin 0.0000 mL    Comment: UP TO 15 MLS   # Vials RhIg NOT INDICATED     Comment: Performed at [facility], [address] [place], [place] [address]  Glucose, capillary     Status: None   Collection Time: 02/07/21  8:52 AM  Result Value Ref Range   Glucose-Capillary 80 70 - 99 mg/dL    Comment: Glucose reference range applies only to samples taken after fasting for at least 8 hours.    Assessment:   30 y.o. [id number]  [redacted]w[redacted]d with preeclampsia and vaginal bleeding in pregnancy- hospital day # 7  Plan:   1) Preeclampsia- BP controlled with procardia.   2) Minimal vaginal bleeding- lot seen on ultrasound earlier this week. KB being repeated today. Hgb stable. Will continue to monitoring.   3) Comfort measures- We discussed moving to a room with a shower. Discussed daily wheelchair rides outside. She could visit her 30 yo outside.   4) Continue with NST every 6 hours.  5) Gestational diabetic- continue levemir 5 units every 12 hours. Fasting and 2 hours after meals  glucose checks.   6) SCDs for DVT prophylaxis  7) Continue inpatient care as planned until 34 weeks. Will access and weigh patient social situation with risks of prematurity, etc with delivery versus continued inpatient care at that time.    Adelene Idler MD, [facility], [facility] 02/07/2021 10:14 AM

## 2021-02-07 NOTE — Progress Notes (Signed)
After discussion with Jerene Pitch, MD, pt. moved to [facility] for access to a bathroom with a shower. Will offer a hospital wheelchair ride at her convenience, and will take outside if weather permits.

## 2021-02-07 NOTE — Progress Notes (Signed)
Reactive NST. Pt verbalizes that she has a headache that has not fully gone away with the Tylenol given. Bonney Aid MD made aware.

## 2021-02-08 DIAGNOSIS — O1403 Mild to moderate pre-eclampsia, third trimester: Secondary | ICD-10-CM | POA: Diagnosis not present

## 2021-02-08 DIAGNOSIS — O4693 Antepartum hemorrhage, unspecified, third trimester: Secondary | ICD-10-CM | POA: Diagnosis not present

## 2021-02-08 DIAGNOSIS — O24414 Gestational diabetes mellitus in pregnancy, insulin controlled: Secondary | ICD-10-CM | POA: Diagnosis not present

## 2021-02-08 DIAGNOSIS — O09293 Supervision of pregnancy with other poor reproductive or obstetric history, third trimester: Secondary | ICD-10-CM | POA: Diagnosis not present

## 2021-02-08 LAB — GLUCOSE, CAPILLARY
Glucose-Capillary: 101 mg/dL — ABNORMAL HIGH (ref 70–99)
Glucose-Capillary: 119 mg/dL — ABNORMAL HIGH (ref 70–99)
Glucose-Capillary: 83 mg/dL (ref 70–99)
Glucose-Capillary: 100 mg/dL — ABNORMAL HIGH (ref 70–99)

## 2021-02-08 MED ORDER — INSULIN DETEMIR 100 UNIT/ML ~~LOC~~ SOLN
6.0000 [IU] | Freq: Two times a day (BID) | SUBCUTANEOUS | Status: DC
  Administered 2021-02-08 – 2021-02-09 (×2): 6 [IU] via SUBCUTANEOUS
  Administered 2021-02-09 – 2021-02-10 (×2): 3 [IU] via SUBCUTANEOUS
  Administered 2021-02-10: 6 [IU] via SUBCUTANEOUS
  Administered 2021-02-11: 3 [IU] via SUBCUTANEOUS
  Administered 2021-02-11 – 2021-02-12 (×2): 6 [IU] via SUBCUTANEOUS
  Filled 2021-02-08 (×13): qty 0.06

## 2021-02-08 MED ORDER — BUTALBITAL-APAP-CAFFEINE 50-325-40 MG PO TABS
1.0000 | ORAL_TABLET | Freq: Two times a day (BID) | ORAL | Status: DC | PRN
Start: 2021-02-08 — End: 2021-02-16
  Administered 2021-02-08: 1 via ORAL
  Filled 2021-02-08: qty 1

## 2021-02-08 NOTE — Progress Notes (Addendum)
NST reviewed by Dr. Jerene Pitch. Continue with the plan of care of an NST every six hours.

## 2021-02-08 NOTE — Progress Notes (Signed)
Pt placed on monitor prior to my shift. Minimal to moderate variability noted with first acceleration at 1901- approximately one hour from initiation of monitoring. No decelerations noted on fetal tracing. Will extend monitoring at this time.

## 2021-02-08 NOTE — Progress Notes (Signed)
Subjective:   Reports that she had a headache most of yesterday, but she took Fioricet overnight and her headache resolved. She denies RUQ pian. She denies vision changes. She has not yet been able to take a wheelchair ride outside, but she will consider.   Objective:   Blood pressure 134/89, pulse 72, temperature 98.2 F (36.8 C), temperature source Oral, resp. rate 16, height 5\' 2"  (1.575 m), weight 118.8 kg, last menstrual period 06/23/2020, unknown if currently breastfeeding.  General: NAD Pulmonary: no increased work of breathing Abdomen: non-distended, non-tender Extremities: no edema, no erythema, no tenderness, no signs of DVT  Results for orders placed or performed during the hospital encounter of 02/01/21 (from the past 72 hour(s))  Glucose, capillary     Status: None   Collection Time: 02/05/21  3:26 PM  Result Value Ref Range   Glucose-Capillary 87 70 - 99 mg/dL    Comment: Glucose reference range applies only to samples taken after fasting for at least 8 hours.  Glucose, capillary     Status: None   Collection Time: 02/05/21  8:25 PM  Result Value Ref Range   Glucose-Capillary 96 70 - 99 mg/dL    Comment: Glucose reference range applies only to samples taken after fasting for at least 8 hours.  Glucose, capillary     Status: Abnormal   Collection Time: 02/05/21 10:18 PM  Result Value Ref Range   Glucose-Capillary 100 (H) 70 - 99 mg/dL    Comment: Glucose reference range applies only to samples taken after fasting for at least 8 hours.  Glucose, capillary     Status: None   Collection Time: 02/06/21  7:34 AM  Result Value Ref Range   Glucose-Capillary 79 70 - 99 mg/dL    Comment: Glucose reference range applies only to samples taken after fasting for at least 8 hours.  Glucose, capillary     Status: Abnormal   Collection Time: 02/06/21 10:01 AM  Result Value Ref Range   Glucose-Capillary 139 (H) 70 - 99 mg/dL    Comment: Glucose reference range applies only to  samples taken after fasting for at least 8 hours.  Glucose, capillary     Status: Abnormal   Collection Time: 02/06/21  3:10 PM  Result Value Ref Range   Glucose-Capillary 101 (H) 70 - 99 mg/dL    Comment: Glucose reference range applies only to samples taken after fasting for at least 8 hours.  Glucose, capillary     Status: Abnormal   Collection Time: 02/06/21  8:13 PM  Result Value Ref Range   Glucose-Capillary 117 (H) 70 - 99 mg/dL    Comment: Glucose reference range applies only to samples taken after fasting for at least 8 hours.  Type and screen     Status: None   Collection Time: 02/07/21  5:12 AM  Result Value Ref Range   ABO/RH(D) A POS    Antibody Screen NEG    Sample Expiration      02/10/2021,2359 Performed at [facility], [address] [place], [place]   CBC     Status: Abnormal   Collection Time: 02/07/21  5:12 AM  Result Value Ref Range   WBC 10.3 4.0 - 10.5 K/uL   RBC 3.96 3.87 - 5.11 MIL/uL   Hemoglobin 10.9 (L) 12.0 - 15.0 g/dL   HCT 02/09/21 (L) 12.1 - 97.5 %   MCV 78.5 (L) 80.0 - 100.0 fL   MCH 27.5 26.0 - 34.0 pg  MCHC 35.0 30.0 - 36.0 g/dL   RDW 42.5 95.6 - 38.7 %   Platelets 311 150 - 400 K/uL   nRBC 0.0 0.0 - 0.2 %    Comment: Performed at [facility], [address] [place], [place] [address]  Comprehensive metabolic panel     Status: Abnormal   Collection Time: 02/07/21  5:12 AM  Result Value Ref Range   Sodium 134 (L) 135 - 145 mmol/L   Potassium 3.4 (L) 3.5 - 5.1 mmol/L   Chloride 105 98 - 111 mmol/L   CO2 21 (L) 22 - 32 mmol/L   Glucose, Bld 122 (H) 70 - 99 mg/dL    Comment: Glucose reference range applies only to samples taken after fasting for at least 8 hours.   BUN 14 6 - 20 mg/dL   Creatinine, Ser 2.95 0.44 - 1.00 mg/dL   Calcium 8.9 8.9 - 18.8 mg/dL   Total Protein 6.2 (L) 6.5 - 8.1 g/dL   Albumin 2.6 (L) 3.5 - 5.0 g/dL   AST 19 15 - 41 U/L   ALT 22 0 - 44 U/L   Alkaline Phosphatase 87 38 - 126 U/L    Total Bilirubin 0.4 0.3 - 1.2 mg/dL   GFR, Estimated >41 >66 mL/min    Comment: (NOTE) Calculated using the CKD-EPI Creatinine Equation (2021)    Anion gap 8 5 - 15    Comment: Performed at [facility], [address] [place], [place] [address]  Fibrinogen     Status: Abnormal   Collection Time: 02/07/21  5:12 AM  Result Value Ref Range   Fibrinogen 602 (H) 210 - 475 mg/dL    Comment: Performed at [facility], [address] [place], [place] [address]  Protime-INR     Status: None   Collection Time: 02/07/21  5:12 AM  Result Value Ref Range   Prothrombin Time 12.8 11.4 - 15.2 seconds   INR 1.0 0.8 - 1.2    Comment: (NOTE) INR goal varies based on device and disease states. Performed at [facility], [address] [place], [place] [address]   APTT     Status: None   Collection Time: 02/07/21  5:12 AM  Result Value Ref Range   aPTT 31 24 - 36 seconds    Comment: Performed at [facility], [address] [place], [place] [address]  Kleihauer-Betke stain     Status: None   Collection Time: 02/07/21  5:12 AM  Result Value Ref Range   Fetal Cells % 0 %   Quantitation Fetal Hemoglobin 0.0000 mL    Comment: UP TO 15 MLS   # Vials RhIg NOT INDICATED     Comment: Performed at [facility], [address] [place], [place] [address]  Glucose, capillary     Status: None   Collection Time: 02/07/21  8:52 AM  Result Value Ref Range   Glucose-Capillary 80 70 - 99 mg/dL    Comment: Glucose reference range applies only to samples taken after fasting for at least 8 hours.  Glucose, capillary     Status: Abnormal   Collection Time: 02/07/21 12:16 PM  Result Value Ref Range   Glucose-Capillary 129 (H) 70 - 99 mg/dL    Comment: Glucose reference range applies only to samples taken after fasting for at least 8 hours.  Glucose, capillary     Status: None   Collection Time: 02/07/21  6:06 PM  Result Value Ref Range  Glucose-Capillary 92 70 - 99 mg/dL    Comment: Glucose reference range applies only to samples taken after fasting for at least 8 hours.  Glucose, capillary     Status: None   Collection Time: 02/07/21 10:15 PM  Result Value Ref Range   Glucose-Capillary 99 70 - 99 mg/dL    Comment: Glucose reference range applies only to samples taken after fasting for at least 8 hours.  Glucose, capillary     Status: Abnormal   Collection Time: 02/08/21  7:18 AM  Result Value Ref Range   Glucose-Capillary 101 (H) 70 - 99 mg/dL    Comment: Glucose reference range applies only to samples taken after fasting for at least 8 hours.  Glucose, capillary     Status: Abnormal   Collection Time: 02/08/21 10:20 AM  Result Value Ref Range   Glucose-Capillary 119 (H) 70 - 99 mg/dL    Comment: Glucose reference range applies only to samples taken after fasting for at least 8 hours.    Assessment:   30 y.o. [id number] [redacted]w[redacted]d with preeclampsia and vaginal bleeding in pregnancy- hospital day # 8    Plan:   1) Preeclampsia- BP controlled with procardia.   2) No vaginal bleeding today- clot seen on ultrasound earlier this week. KB negative. Hgb stable. Will continue to monitoring.   3) Comfort measures- We discussed moving to a room with a shower. Discussed daily wheelchair rides outside. She could visit her 31 yo outside.   4) Continue with NST every 6 hours.  5) Gestational diabetic- increase levemir to 6 units every 12 hours. Fasting and 2 hours after meals glucose checks.   6) SCDs for DVT prophylaxis  7) Continue inpatient care as planned until 34 weeks. Will access and weigh patient social situation with risks of prematurity, etc with delivery versus continued inpatient care at that time.  Adelene Idler MD [facility], [facility] 02/08/2021 11:25 AM

## 2021-02-08 NOTE — Progress Notes (Signed)
   02/08/21 0700  Fetal Heart Rate A  Mode External  Baseline Rate (A) 135 bpm  Variability 6-25 BPM  Accelerations 15 x 15  Decelerations None  Uterine Activity  Mode Toco  Contraction Frequency (min) none    NST from 6:40am to 7:00am is reactive and category 1 tracing.

## 2021-02-09 DIAGNOSIS — O1403 Mild to moderate pre-eclampsia, third trimester: Secondary | ICD-10-CM

## 2021-02-09 DIAGNOSIS — O24414 Gestational diabetes mellitus in pregnancy, insulin controlled: Secondary | ICD-10-CM

## 2021-02-09 DIAGNOSIS — Z3A33 33 weeks gestation of pregnancy: Secondary | ICD-10-CM

## 2021-02-09 DIAGNOSIS — O4593 Premature separation of placenta, unspecified, third trimester: Secondary | ICD-10-CM

## 2021-02-09 LAB — GLUCOSE, CAPILLARY
Glucose-Capillary: 77 mg/dL (ref 70–99)
Glucose-Capillary: 108 mg/dL — ABNORMAL HIGH (ref 70–99)
Glucose-Capillary: 104 mg/dL — ABNORMAL HIGH (ref 70–99)
Glucose-Capillary: 88 mg/dL (ref 70–99)

## 2021-02-09 NOTE — Progress Notes (Signed)
Pt removed from monitors. Dr. Jerene Pitch in department to review tracing. Cat 1 tracing noted. Will continue with the plan of care of Q6 NST's.

## 2021-02-09 NOTE — Progress Notes (Signed)
Spoke to patient about changes in visitation policy. Patient now aware that 31 yr old daughter may visit during visiting hours with accompanied adult.

## 2021-02-09 NOTE — Progress Notes (Addendum)
Patients grandma and 31 yr old daughter here to visit.

## 2021-02-09 NOTE — Progress Notes (Signed)
Pt back to room at approx 2210. FSBS obtained and is 88. Will give 3 units Levemir per MD order and peanut butter. Pt c/o pink discharge when up to BR after void when wiping and pressure "down low" in abd.; KVeal, CNM notified and pt sent to Obs 1 for monitoring.

## 2021-02-09 NOTE — Progress Notes (Signed)
Patient placed on fetal monitors for scheduled NST

## 2021-02-09 NOTE — Progress Notes (Signed)
Daily Antepartum Note  Admission Date: 02/01/2021 Current Date: 02/09/2021 10:55 AM  CAREL SCHNEE is a 30 y.o. [id number] @ [redacted]w[redacted]d by LMP consistent with 11 wk [place], HD#9, admitted for vaginal bleeding, concern for placental abruption with poor obstetric history.  Pregnancy complicated by:  Patient Active Problem List   Diagnosis Date Noted  . Pregnancy with poor obstetric history   . Vaginal bleeding in pregnancy, third trimester 02/01/2021  . [redacted] weeks gestation of pregnancy 02/01/2021  . Anemia affecting eighth pregnancy 01/24/2021  . Mild preeclampsia, third trimester 01/23/2021  . Insulin controlled gestational diabetes mellitus (GDM) in third trimester 12/08/2020  . History of gestational diabetes 08/26/2020  . BMI 40.0-44.9, adult (HCC) 08/26/2020  . Sickle cell trait (HCC) 08/26/2020  . Pregnancy with poor reproductive history in first trimester 08/26/2020  . Stillbirth with antepartum death Feb 08, 2019  . Supervision of high-risk pregnancy, third trimester 11/02/2016  . Obesity affecting pregnancy in third trimester 11/02/2016    Overnight/24hr events:  none  Subjective:  Resting comfortably in bed.  Objective:   Vitals:   02/09/21 0314 02/09/21 0739  BP: 132/87 129/85  Pulse: 75 66  Resp:  18  Temp: 98.4 F (36.9 C) 97.9 F (36.6 C)  SpO2: 99% 99%   Temp:  [97.9 F (36.6 C)-98.6 F (37 C)] 97.9 F (36.6 C) (05/16 0739) Pulse Rate:  [66-93] 66 (05/16 0739) Resp:  [16-18] 18 (05/16 0739) BP: (114-140)/(85-96) 129/85 (05/16 0739) SpO2:  [94 %-100 %] 99 % (05/16 0739) Temp (24hrs), Avg:98.3 F (36.8 C), Min:97.9 F (36.6 C), Max:98.6 F (37 C)   Intake/Output Summary (Last 24 hours) at 02/09/2021 1055 Last data filed at 02/09/2021 1018 Gross per 24 hour  Intake 240 ml  Output --  Net 240 ml     Current Vital Signs 24h Vital Sign Ranges  T 97.9 F (36.6 C) Temp  Avg: 98.3 F (36.8 C)  Min: 97.9 F (36.6 C)  Max: 98.6 F (37 C)  BP 129/85 BP  Min:  114/85  Max: 140/96  HR 66 Pulse  Avg: 80.7  Min: 66  Max: 93  RR 18 Resp  Avg: 17.2  Min: 16  Max: 18  SaO2 99 %  (room air) SpO2  Avg: 97.5 %  Min: 94 %  Max: 100 %       24 Hour I/O Current Shift I/O  Time Ins Outs No intake/output data recorded. 05/16 0701 - 05/16 1900 In: 240 [P.O.:240] Out: -    Patient Vitals for the past 24 hrs:  BP Temp Temp src Pulse Resp SpO2  02/09/21 0739 129/85 97.9 F (36.6 C) Oral 66 18 99 %  02/09/21 0314 132/87 98.4 F (36.9 C) Oral 75 -- 99 %  02/08/21 2311 (!) 140/96 98.6 F (37 C) Oral 72 16 96 %  02/08/21 1910 134/90 98.5 F (36.9 C) Oral 88 16 97 %  02/08/21 1706 114/85 98.4 F (36.9 C) Oral 90 18 100 %  02/08/21 1402 (!) 131/92 98.1 F (36.7 C) Oral 93 18 94 %  02/08/21 1200 -- 98 F (36.7 C) Oral -- -- --   Physical exam: General: Well nourished, well developed female in no acute distress. Abdomen: gravid  - c/w [redacted] week gestation Cardiovascular: S1, S2 normal, no murmur, rub or gallop, regular rate and rhythm Respiratory: CTAB Extremities: no clubbing, cyanosis or edema Skin: Warm and dry.   Medications: Current Facility-Administered Medications  Medication Dose Route Frequency Provider Last Rate Last  Admin  . acetaminophen (TYLENOL) tablet 650 mg  650 mg Oral Q4H PRN Mirna Mires, CNM   650 mg at 02/07/21 2358  . aspirin chewable tablet 81 mg  81 mg Oral Daily Zipporah Plants, CNM   81 mg at 02/09/21 1022  . butalbital-acetaminophen-caffeine (FIORICET) 50-325-40 MG per tablet 1 tablet  1 tablet Oral BID PRN Natale Milch, MD   1 tablet at 02/08/21 0319  . dextrose 5 % in lactated ringers infusion   Intravenous Continuous Conard Novak, MD   Stopped at 02/02/21 2128  . labetalol (NORMODYNE) injection 20 mg  20 mg Intravenous PRN Tresea Mall, CNM       And  . labetalol (NORMODYNE) injection 40 mg  40 mg Intravenous PRN Tresea Mall, CNM       And  . labetalol (NORMODYNE) injection 80 mg  80 mg Intravenous  PRN Tresea Mall, CNM       And  . hydrALAZINE (APRESOLINE) injection 10 mg  10 mg Intravenous PRN Tresea Mall, CNM      . insulin aspart (novoLOG) injection 0-16 Units  0-16 Units Subcutaneous TID PC Conard Novak, MD   2 Units at 02/08/21 2231  . insulin detemir (LEVEMIR) injection 6 Units  6 Units Subcutaneous BID Natale Milch, MD   6 Units at 02/08/21 2231  . NIFEdipine (PROCARDIA-XL/NIFEDICAL-XL) 24 hr tablet 60 mg  60 mg Oral Daily Zipporah Plants, CNM   60 mg at 02/09/21 1022    Labs:  Recent Labs  Lab 02/03/21 1250 02/05/21 0517 02/07/21 0512  WBC 11.1* 11.7* 10.3  HGB 10.3* 10.8* 10.9*  HCT 29.8* 30.9* 31.1*  PLT 347 323 311    Recent Labs  Lab 02/05/21 0517 02/07/21 0512  NA 136 134*  K 3.6 3.4*  CL 104 105  CO2 23 21*  BUN 17 14  CREATININE 0.57 0.50  CALCIUM 8.6* 8.9  PROT 6.2* 6.2*  BILITOT 0.4 0.4  ALKPHOS 82 87  ALT 18 22  AST 15 19  GLUCOSE 93 122*    Assessment & Plan:   30 yo [id number] at [redacted]w[redacted]d with preeclampsia without severe features and vaginal bleeding - hospital day # 9  1) Vaginal bleeding - stable - no further acute bleeding noted at this time -Will continue inpatient observation with plan for delivery at 34 weeks if no other clinical indication for delivery according to MFM recs  2) Preeclampsia without severe features -Continue procardia XL 30 mg - BP normotensive to mild range  3) GDM - continue levemir 6 units BID with SSI coverage  4) Fetal well-being: reactive NST  5) SCDs for VTE PP  6) Patient encouraged to ambulate, visitor policies now allow visitation with daughter  7) Disposition - continue current care  Zipporah Plants, CNM [facility], [facility] 02/09/2021 10:58 AM

## 2021-02-09 NOTE — Progress Notes (Signed)
Pt removed from the monitor and reactive NST reviewed by Dr. Jerene Pitch, MD. Will continue with plan of care of Q6 NST's.

## 2021-02-09 NOTE — Progress Notes (Signed)
Pt ambulatory to L&D for NST- escorted by RN

## 2021-02-09 NOTE — Progress Notes (Signed)
Pt placed on monitors for scheduled NST

## 2021-02-10 ENCOUNTER — Encounter: Payer: Medicaid Other | Admitting: Obstetrics and Gynecology

## 2021-02-10 ENCOUNTER — Inpatient Hospital Stay: Payer: Medicaid Other

## 2021-02-10 DIAGNOSIS — Z3A33 33 weeks gestation of pregnancy: Secondary | ICD-10-CM | POA: Diagnosis not present

## 2021-02-10 DIAGNOSIS — O458X3 Other premature separation of placenta, third trimester: Secondary | ICD-10-CM

## 2021-02-10 DIAGNOSIS — O24414 Gestational diabetes mellitus in pregnancy, insulin controlled: Secondary | ICD-10-CM | POA: Diagnosis not present

## 2021-02-10 DIAGNOSIS — O4593 Premature separation of placenta, unspecified, third trimester: Secondary | ICD-10-CM | POA: Diagnosis not present

## 2021-02-10 DIAGNOSIS — O09293 Supervision of pregnancy with other poor reproductive or obstetric history, third trimester: Secondary | ICD-10-CM | POA: Diagnosis not present

## 2021-02-10 DIAGNOSIS — O1403 Mild to moderate pre-eclampsia, third trimester: Secondary | ICD-10-CM | POA: Diagnosis not present

## 2021-02-10 LAB — CBC
HCT: 30.9 % — ABNORMAL LOW (ref 36.0–46.0)
Hemoglobin: 10.8 g/dL — ABNORMAL LOW (ref 12.0–15.0)
MCH: 27.5 pg (ref 26.0–34.0)
MCHC: 35 g/dL (ref 30.0–36.0)
MCV: 78.6 fL — ABNORMAL LOW (ref 80.0–100.0)
Platelets: 306 10*3/uL (ref 150–400)
RBC: 3.93 MIL/uL (ref 3.87–5.11)
RDW: 13.1 % (ref 11.5–15.5)
WBC: 8.4 10*3/uL (ref 4.0–10.5)
nRBC: 0 % (ref 0.0–0.2)

## 2021-02-10 LAB — COMPREHENSIVE METABOLIC PANEL
ALT: 50 U/L — ABNORMAL HIGH (ref 0–44)
ALT: 57 U/L — ABNORMAL HIGH (ref 0–44)
AST: 34 U/L (ref 15–41)
AST: 38 U/L (ref 15–41)
Albumin: 2.7 g/dL — ABNORMAL LOW (ref 3.5–5.0)
Albumin: 2.7 g/dL — ABNORMAL LOW (ref 3.5–5.0)
Alkaline Phosphatase: 90 U/L (ref 38–126)
Alkaline Phosphatase: 93 U/L (ref 38–126)
Anion gap: 6 (ref 5–15)
Anion gap: 7 (ref 5–15)
BUN: 12 mg/dL (ref 6–20)
BUN: 11 mg/dL (ref 6–20)
CO2: 22 mmol/L (ref 22–32)
CO2: 21 mmol/L — ABNORMAL LOW (ref 22–32)
Calcium: 8.3 mg/dL — ABNORMAL LOW (ref 8.9–10.3)
Calcium: 8.3 mg/dL — ABNORMAL LOW (ref 8.9–10.3)
Chloride: 107 mmol/L (ref 98–111)
Chloride: 106 mmol/L (ref 98–111)
Creatinine, Ser: 0.39 mg/dL — ABNORMAL LOW (ref 0.44–1.00)
Creatinine, Ser: 0.56 mg/dL (ref 0.44–1.00)
GFR, Estimated: >60 mL/min (ref >60)
GFR, Estimated: >60 mL/min (ref >60)
Glucose, Bld: 79 mg/dL (ref 70–99)
Glucose, Bld: 89 mg/dL (ref 70–99)
Potassium: 3.6 mmol/L (ref 3.5–5.1)
Potassium: 3.8 mmol/L (ref 3.5–5.1)
Sodium: 135 mmol/L (ref 135–145)
Sodium: 134 mmol/L — ABNORMAL LOW (ref 135–145)
Total Bilirubin: 0.2 mg/dL — ABNORMAL LOW (ref 0.3–1.2)
Total Bilirubin: 0.4 mg/dL (ref 0.3–1.2)
Total Protein: 6.5 g/dL (ref 6.5–8.1)
Total Protein: 6.8 g/dL (ref 6.5–8.1)

## 2021-02-10 LAB — APTT: aPTT: 32 seconds (ref 24–36)

## 2021-02-10 LAB — TYPE AND SCREEN
ABO/RH(D): A POS
Antibody Screen: NEGATIVE

## 2021-02-10 LAB — KLEIHAUER-BETKE STAIN
Fetal Cells %: 0.3 %
Quantitation Fetal Hemoglobin: 0.0015 mL

## 2021-02-10 LAB — GLUCOSE, CAPILLARY
Glucose-Capillary: 75 mg/dL (ref 70–99)
Glucose-Capillary: 115 mg/dL — ABNORMAL HIGH (ref 70–99)
Glucose-Capillary: 103 mg/dL — ABNORMAL HIGH (ref 70–99)
Glucose-Capillary: 84 mg/dL (ref 70–99)

## 2021-02-10 LAB — URINALYSIS, ROUTINE W REFLEX MICROSCOPIC
Bilirubin Urine: NEGATIVE
Glucose, UA: NEGATIVE mg/dL
Ketones, ur: NEGATIVE mg/dL
Leukocytes,Ua: NEGATIVE
Nitrite: NEGATIVE
Protein, ur: 100 mg/dL — AB
Specific Gravity, Urine: 1.016 (ref 1.005–1.030)
pH: 7 (ref 5.0–8.0)

## 2021-02-10 LAB — PROTEIN / CREATININE RATIO, URINE
Creatinine, Urine: 102 mg/dL
Protein Creatinine Ratio: 1.27 mg/mg{Cre} — ABNORMAL HIGH (ref 0.00–0.15)
Total Protein, Urine: 130 mg/dL

## 2021-02-10 LAB — PROTIME-INR
INR: 1 (ref 0.8–1.2)
Prothrombin Time: 12.7 seconds (ref 11.4–15.2)

## 2021-02-10 LAB — FIBRINOGEN: Fibrinogen: 665 mg/dL — ABNORMAL HIGH (ref 210–475)

## 2021-02-10 NOTE — Progress Notes (Signed)
   02/10/21 1430  Clinical Encounter Type  Visited With Patient  Visit Type Initial  Referral From Nurse  Consult/Referral To Chaplain  Spiritual Encounters  Spiritual Needs Prayer;Emotional  Chaplain Myiah Petkus responded to an OR for room OBS-1, Pt Sierra Jordan. Pt is 8 months pregnant and she has been having some difficulties. Pt is here under observation and she stated, they may perform a c-section on Monday. I provided reflective listening, emotional and spiritual support and prayer was given and received at the end of the visit.  Pt appeared to be is good spirits and she stated, she just need to relax and not stress.

## 2021-02-10 NOTE — Progress Notes (Signed)
Patient returned to Unit OBs 1 for continued observation as primary nurse noted the patient having some pink tinged vaginal discharge after getting up to void when she returned to room 341 (post NST). Jobie Quaker, CNM gave orders to monitor patient overnight on unit and notify her of any significant changes. Patient's pad was assessed and clean with no spotting. Patient was encouraged to notify nurse if any contractions, vaginal discharge/blood, or pelvic pain or pressure. Patient verbalized understanding.

## 2021-02-10 NOTE — Progress Notes (Signed)
Subjective: Patient reports tolerating PO and no problems voiding.  She denies any headache this morning , and her baby is moving well. No SOB, no chest pain or visual disturbances. Last evening, she had one episode of visual "sparklers".  She has seen a scant amount of pink vaginal discharge after using the bathroom. She denies any LOF  Sh complained last evening of suparppubic "bone" type pain, which has gone away.  Objective: I have reviewed patient's vital signs and labs. BP 128/80   Pulse (!) 108   Temp 97.8 F (36.6 C) (Oral)   Resp 18   Ht 5\' 2"  (1.575 m)   Wt 118.8 kg   LMP 06/23/2020   SpO2 100%   BMI 47.92 kg/m    NST: FHTS 120 baseline, moderate variability, with accels present,no decels. No contractions seen or reported/palpated.  Labs of note; Her UPC ratio this morning is now 1.27, an increase from 0.74. Her ALT has also risen from 22 to 50.  A UA shows 1+ protein , and + HGB. Negative for Leuks or nitrites Glucose check this am:75 Kleihauer-Betke is negative APTT is 32 PT is 12.7 INR 1.0 Fibrinogen: 665 (602 3 three days ago) CBC; h and h 10.8/30.9, with plts of 306   General: cooperative, no distress and moderately obese Resp: clear to auscultation bilaterally Cardio: regular rate and rhythm, S1, S2 normal, no murmur, click, rub or gallop GI: soft, non-tender; bowel sounds normal; no masses,  no organomegaly and gravid abdomen Extremities: extremities normal, atraumatic, no cyanosis or edema and Homans sign is negative, no sign of DVT   Assessment/Plan: IUP 33 weeks 1 day  High risk pregnancy with Hx of IUFD at 35 wks, Hx of CS, High BMI GDM- insulin Chronic abruption Chronic HTN with superimposed pre eclampsia Reassuring FHTS per NST. Increasing UPC ratio and ALT.  Plan: Dr. 05-17-1985 has been consulted, and plans to see patinet today. Will discuss POC with MFM.  Continue present orders.  Grace Bushy, CNM  02/10/2021 10:07 AM    LOS: 7 days     02/12/2021 02/10/2021, 9:51 AM

## 2021-02-10 NOTE — Consult Note (Signed)
Follow up MFM consult  Sierra Jordan is a 30 yo G8P1 who is hospitalized due to chronic abruption with prior history of IUFD at ~34 weeks and preeclampsia without severe features. She is seen today at the request of Paula Compton, CNM.  She is overall doing well with bleeding that has reduced to pinkish discharge. Her blood pressure is stable and denies uterine contractions.  Vitals with BMI 02/10/2021 02/10/2021 02/10/2021  Height - - -  Weight - - -  BMI - - -  Systolic 138 141 237  Diastolic 92 98 78  Pulse 88 84 81   CBC Latest Ref Rng & Units 02/10/2021 02/07/2021 02/05/2021  WBC 4.0 - 10.5 K/uL 8.4 10.3 11.7(H)  Hemoglobin 12.0 - 15.0 g/dL 10.8(L) 10.9(L) 10.8(L)  Hematocrit 36.0 - 46.0 % 30.9(L) 31.1(L) 30.9(L)  Platelets 150 - 400 K/uL 306 311 323   CMP Latest Ref Rng & Units 02/10/2021 02/07/2021 02/05/2021  Glucose 70 - 99 mg/dL 79 628(B) 93  BUN 6 - 20 mg/dL 12 14 17   Creatinine 0.44 - 1.00 mg/dL ) 1.51(V 6.16  Sodium 135 - 145 mmol/L 135 134(L) 136  Potassium 3.5 - 5.1 mmol/L 3.6 3.4(L) 3.6  Chloride 98 - 111 mmol/L 107 105 104  CO2 22 - 32 mmol/L 22 21(L) 23  Calcium 8.9 - 10.3 mg/dL 8.3(L) 8.9 8.6(L)  Total Protein 6.5 - 8.1 g/dL 6.5 0.73) 6.2(L)  Total Bilirubin 0.3 - 1.2 mg/dL 7.1(G) 0.4 0.4  Alkaline Phos 38 - 126 U/L 90 87 82  AST 15 - 41 U/L 34 19 15  ALT 0 - 44 U/L 50(H) 22 18     Current Facility-Administered Medications (Endocrine & Metabolic):  6.2(I  CBG monitoring **AND** insulin aspart (novoLOG) injection 0-16 Units **  .  insulin detemir (LEVEMIR) injection 6 Units   Current Facility-Administered Medications (Cardiovascular):  .  labetalol (NORMODYNE) injection 20 mg **AND** labetalol (NORMODYNE) injection 40 mg **AND** labetalol (NORMODYNE) injection 80 mg **AND** hydrALAZINE (APRESOLINE) injection 10 mg **AND** Measure blood pressure ** .  NIFEdipine (PROCARDIA-XL/NIFEDICAL-XL) 24 hr tablet 60 mg     Current Facility-Administered Medications  (Analgesics):  .  acetaminophen (TYLENOL) tablet 650 mg .  aspirin chewable tablet 81 mg .  butalbital-acetaminophen-caffeine (FIORICET) 50-325-40 MG per tablet 1 tablet     Current Facility-Administered Medications (Other):  .  dextrose 5 % in lactated ringers infusion  No current outpatient medications on file.  Imaging: EFW 1600g 54% on 04/26 Follow up BPP on 5/10 was normal.  Impression/Counseling:  I reviewed with Sierra Jordan her current diagnosis of preeclampsia without severe features, chronic hypertension, chronic abruption, A2GDM and prior IUFD at 34 weeks.  I conveyed that I recommend delivery at 34 weeks unless abnormal fetal testing, new onset bleeding, abnormal labs or severe range blood pressure not controlled by IV therapy.  She had a recent increase in her ALT today of 50 U/L I discussed with Sierra Jordan CNM to repeat those labs this late afternoon. Sierra Jordan again is asymptomatic without RUG pain. Her abdomen was none tender.  Continue daily NST and 2x weekly BPP.  Delivery at 34 weeks.  I discussed today's recommendations with Dr. Karilyn Jordan who was in agreement.  All questions answered.  I spent 30 minutes with > 50% in face to face consultation.   Tiburcio Pea, MD

## 2021-02-10 NOTE — Progress Notes (Signed)
Notified overnight by bedside RN that patient has new onset pelvic pain and has had pink spotting with wiping after voiding. Assessed patient at bedside. Patient reports that she is having pain located in her vagina and at her pubic symphysis, which she identified with pointing. Patient first noted this pain tonight after ambulating for the first time today. She states that it is a "dull, ache, cramping" type pain that intensifies significantly with movement. Patient reports that the pain does not feel like uterine cramping. She states that the pain is not consistent with the pain she experienced with her abruption in prior pregnancy. She reports +FM.   Patient on continue EFM - moderate variability with intermittent 15x15 accels noted, no decels. VSS. Plan made to collect labs and continue to monitor closely. Tylenol for pain. Patient to notified RN if any changes in her pain or increase in vaginal spotting.  Sierra Jordan, CNM [facility], [facility] 02/10/2021 1:43 AM

## 2021-02-10 NOTE — Progress Notes (Addendum)
  Progress Note   30 y.o. [id number] @ [redacted]w[redacted]d , admitted for Chronic Abruption, Preeclampsia, Gestational Diabetes on Insulin  Subjective:  No bleeding today.  No pain. BS under control. BP under control taking Procardia 30 mg daily Prior CS  Objective:  BP (!) 137/98   Pulse 88   Temp 97.8 F (36.6 C) (Oral)   Resp 18   Ht 5\' 2"  (1.575 m)   Wt 118.8 kg   LMP 06/23/2020   SpO2 100%   BMI 47.92 kg/m  Abd: gravid, ND, FHT present, without guarding, without rebound tenderness on exam Extr: trace to 1+ bilateral pedal edema  A NST procedure was performed with FHR monitoring and a normal baseline established, appropriate time of 20-40 minutes of evaluation, and accels >2 seen w 15x15 characteristics.  Results show a REACTIVE NST.   LFT trending up, Hgb stable U P/C trending up  Assessment & Plan:  06/25/2020 @ [redacted]w[redacted]d, admitted for antepartum management of chronic abrution, gest diabetes, preeclampsia, prior CS  Delivery 34 weeks, prior to that with any worsening signs CS schedule for 02/16/21 at 0730 w Drs 02/18/21 and Tiburcio Pea If worsening bleeding, then will deliver sooner If severe range blood pressures not controlled well with IV meds, then will deliver then If pattern of worsening LFT or CBC labs, then will deliver sooner Proteinuria not as predictive a marker, so will not rely on this nor repeat this test.  MFM concurs.  Pros and cons of CS, prematurity discussed  SCD for DVT prevention, as she has minimal activity while inpatient Diet Insulin Procardia  All discussed with patient, see orders Question answered.  She is content with this plan.  A total of 50 minutes were spent face-to-face with the patient as well as preparation, review, communication, and documentation during this encounter.   Bonney Aid, MD, [facility], [facility] 02/10/2021  4:31 PM

## 2021-02-11 DIAGNOSIS — O4593 Premature separation of placenta, unspecified, third trimester: Secondary | ICD-10-CM | POA: Diagnosis not present

## 2021-02-11 DIAGNOSIS — O24414 Gestational diabetes mellitus in pregnancy, insulin controlled: Secondary | ICD-10-CM | POA: Diagnosis not present

## 2021-02-11 DIAGNOSIS — O1403 Mild to moderate pre-eclampsia, third trimester: Secondary | ICD-10-CM | POA: Diagnosis not present

## 2021-02-11 DIAGNOSIS — Z3A33 33 weeks gestation of pregnancy: Secondary | ICD-10-CM | POA: Diagnosis not present

## 2021-02-11 LAB — COMPREHENSIVE METABOLIC PANEL
ALT: 57 U/L — ABNORMAL HIGH (ref 0–44)
AST: 35 U/L (ref 15–41)
Albumin: 2.5 g/dL — ABNORMAL LOW (ref 3.5–5.0)
Alkaline Phosphatase: 99 U/L (ref 38–126)
Anion gap: 6 (ref 5–15)
BUN: 12 mg/dL (ref 6–20)
CO2: 22 mmol/L (ref 22–32)
Calcium: 8.3 mg/dL — ABNORMAL LOW (ref 8.9–10.3)
Chloride: 106 mmol/L (ref 98–111)
Creatinine, Ser: 0.46 mg/dL (ref 0.44–1.00)
GFR, Estimated: >60 mL/min (ref >60)
Glucose, Bld: 72 mg/dL (ref 70–99)
Potassium: 3.6 mmol/L (ref 3.5–5.1)
Sodium: 134 mmol/L — ABNORMAL LOW (ref 135–145)
Total Bilirubin: 0.4 mg/dL (ref 0.3–1.2)
Total Protein: 6.3 g/dL — ABNORMAL LOW (ref 6.5–8.1)

## 2021-02-11 LAB — GLUCOSE, CAPILLARY
Glucose-Capillary: 78 mg/dL (ref 70–99)
Glucose-Capillary: 120 mg/dL — ABNORMAL HIGH (ref 70–99)
Glucose-Capillary: 99 mg/dL (ref 70–99)
Glucose-Capillary: 84 mg/dL (ref 70–99)

## 2021-02-11 NOTE — Progress Notes (Signed)
CNM reviewed NST at bedside

## 2021-02-11 NOTE — Progress Notes (Signed)
Subjective:  Sierra Jordan is feeling well this morning. Verbalizing that she feels better knowing that her CS is set up for next Monday. She denies headaches, blurred vision, chest pain, shortness of breath.her baby is moving well.she denies any contractions or LOF..Still notices a scan tpink discharge after using the bathroom. Voiding without difficulty. Tolerating a regular diabetic diet. Ambulating well.  Objective:   Blood pressure (!) 131/93, pulse 80, temperature 97.9 F (36.6 C), temperature source Oral, resp. rate 20, height 5\' 2"  (1.575 m), weight 118.8 kg, last menstrual period 06/23/2020, SpO2 95 %, unknown if currently breastfeeding. Patient Vitals for the past 24 hrs:  BP Temp Temp src Pulse Resp SpO2  02/11/21 1023 (!) 131/93 -- -- 80 -- --  02/11/21 0815 (!) 141/98 97.9 F (36.6 C) Oral 72 -- 95 %  02/11/21 0322 127/90 98.4 F (36.9 C) Oral 74 20 96 %  02/11/21 0015 (!) 134/105 98.7 F (37.1 C) Oral 88 18 100 %  02/10/21 1955 (!) 135/103 98.6 F (37 C) Oral 91 20 98 %  02/10/21 1830 (!) 139/101 -- -- 96 -- --  02/10/21 1545 (!) 142/99 -- -- (!) 111 -- --  02/10/21 1445 (!) 137/98 -- -- 76 -- --  02/10/21 1345 (!) 138/92 -- -- 88 -- --  02/10/21 1245 (!) 141/98 -- -- 84 -- --    General: NAD Pulmonary: no increased work of breathing Abdomen: non-distended, non-tender Uterus: soft, no contractions palpated or reported by the patient. Extremities: no edema, no erythema, no tenderness, no signs of DVT  Results for orders placed or performed during the hospital encounter of 02/01/21 (from the past 72 hour(s))  Glucose, capillary     Status: None   Collection Time: 02/08/21  4:21 PM  Result Value Ref Range   Glucose-Capillary 83 70 - 99 mg/dL    Comment: Glucose reference range applies only to samples taken after fasting for at least 8 hours.  Glucose, capillary     Status: Abnormal   Collection Time: 02/08/21  9:44 PM  Result Value Ref Range   Glucose-Capillary 100 (H) 70  - 99 mg/dL    Comment: Glucose reference range applies only to samples taken after fasting for at least 8 hours.  Glucose, capillary     Status: None   Collection Time: 02/09/21  7:01 AM  Result Value Ref Range   Glucose-Capillary 77 70 - 99 mg/dL    Comment: Glucose reference range applies only to samples taken after fasting for at least 8 hours.   Comment 1 Notify RN   Glucose, capillary     Status: Abnormal   Collection Time: 02/09/21 11:49 AM  Result Value Ref Range   Glucose-Capillary 108 (H) 70 - 99 mg/dL    Comment: Glucose reference range applies only to samples taken after fasting for at least 8 hours.   Comment 1 Notify RN   Glucose, capillary     Status: Abnormal   Collection Time: 02/09/21  3:25 PM  Result Value Ref Range   Glucose-Capillary 104 (H) 70 - 99 mg/dL    Comment: Glucose reference range applies only to samples taken after fasting for at least 8 hours.  Glucose, capillary     Status: None   Collection Time: 02/09/21 10:19 PM  Result Value Ref Range   Glucose-Capillary 88 70 - 99 mg/dL    Comment: Glucose reference range applies only to samples taken after fasting for at least 8 hours.  Type and screen  Status: None   Collection Time: 02/10/21  2:25 AM  Result Value Ref Range   ABO/RH(D) A POS    Antibody Screen NEG    Sample Expiration      02/13/2021,2359 Performed at [facility], [address] [place], [place] [address]   Comprehensive metabolic panel     Status: Abnormal   Collection Time: 02/10/21  2:25 AM  Result Value Ref Range   Sodium 135 135 - 145 mmol/L   Potassium 3.6 3.5 - 5.1 mmol/L   Chloride 107 98 - 111 mmol/L   CO2 22 22 - 32 mmol/L   Glucose, Bld 79 70 - 99 mg/dL    Comment: Glucose reference range applies only to samples taken after fasting for at least 8 hours.   BUN 12 6 - 20 mg/dL   Creatinine, Ser 2.68 (L) 0.44 - 1.00 mg/dL   Calcium 8.3 (L) 8.9 - 10.3 mg/dL   Total Protein 6.5 6.5 - 8.1 g/dL   Albumin 2.7 (L)  3.5 - 5.0 g/dL   AST 34 15 - 41 U/L   ALT 50 (H) 0 - 44 U/L   Alkaline Phosphatase 90 38 - 126 U/L   Total Bilirubin 0.2 (L) 0.3 - 1.2 mg/dL   GFR, Estimated >34 >19 mL/min    Comment: (NOTE) Calculated using the CKD-EPI Creatinine Equation (2021)    Anion gap 6 5 - 15    Comment: Performed at [facility], [address] [place], [place] [address]  CBC     Status: Abnormal   Collection Time: 02/10/21  2:25 AM  Result Value Ref Range   WBC 8.4 4.0 - 10.5 K/uL   RBC 3.93 3.87 - 5.11 MIL/uL   Hemoglobin 10.8 (L) 12.0 - 15.0 g/dL   HCT 79.8 (L) 92.1 - 19.4 %   MCV 78.6 (L) 80.0 - 100.0 fL   MCH 27.5 26.0 - 34.0 pg   MCHC 35.0 30.0 - 36.0 g/dL   RDW 17.4 08.1 - 44.8 %   Platelets 306 150 - 400 K/uL   nRBC 0.0 0.0 - 0.2 %    Comment: Performed at [facility], [address] [place], [place] [address]  Fibrinogen     Status: Abnormal   Collection Time: 02/10/21  2:25 AM  Result Value Ref Range   Fibrinogen 665 (H) 210 - 475 mg/dL    Comment: Performed at [facility], [address] [place], [place] [address]  Protime-INR     Status: None   Collection Time: 02/10/21  2:25 AM  Result Value Ref Range   Prothrombin Time 12.7 11.4 - 15.2 seconds   INR 1.0 0.8 - 1.2    Comment: (NOTE) INR goal varies based on device and disease states. Performed at [facility], [address] [place], [place] [address]   APTT     Status: None   Collection Time: 02/10/21  2:25 AM  Result Value Ref Range   aPTT 32 24 - 36 seconds    Comment: Performed at [facility], [address] [place], [place] [address]  Kleihauer-Betke stain     Status: None   Collection Time: 02/10/21  2:25 AM  Result Value Ref Range   Fetal Cells % 0.3 %   Quantitation Fetal Hemoglobin 0.0015 mL    Comment: UP TO   # Vials RhIg NOT INDICATED     Comment: CONFIRMED BY [facility] Performed at [facility], [address] [place], [place] [address]    Urinalysis, Routine w reflex microscopic Urine, Clean Catch     Status: Abnormal   Collection Time: 02/10/21  7:58 AM  Result Value Ref Range   Color, Urine YELLOW (A) YELLOW   APPearance CLEAR (A) CLEAR   Specific Gravity, Urine 1.016 1.005 - 1.030   pH 7.0 5.0 - 8.0   Glucose, UA NEGATIVE NEGATIVE mg/dL   Hgb urine dipstick MODERATE (A) NEGATIVE   Bilirubin Urine NEGATIVE NEGATIVE   Ketones, ur NEGATIVE NEGATIVE mg/dL   Protein, ur 952100 (A) NEGATIVE mg/dL   Nitrite NEGATIVE NEGATIVE   Leukocytes,Ua NEGATIVE NEGATIVE   RBC / HPF 0-5 0 - 5 RBC/hpf   WBC, UA 0-5 0 - 5 WBC/hpf   Bacteria, UA RARE (A) NONE SEEN   Squamous Epithelial / LPF 0-5 0 - 5   Mucus PRESENT    Granular Casts, UA PRESENT     Comment: Performed at [facility], [address] [place], [place] [address]  Protein / creatinine ratio, urine     Status: Abnormal   Collection Time: 02/10/21  7:58 AM  Result Value Ref Range   Creatinine, Urine 102 mg/dL   Total Protein, Urine 130 mg/dL    Comment: NO NORMAL RANGE ESTABLISHED FOR THIS TEST   Protein Creatinine Ratio 1.27 (H) 0.00 - 0.15 mg/mg[Cre]    Comment: Performed at [facility], [address] [place], [place] [address]  Glucose, capillary     Status: None   Collection Time: 02/10/21  8:25 AM  Result Value Ref Range   Glucose-Capillary 75 70 - 99 mg/dL    Comment: Glucose reference range applies only to samples taken after fasting for at least 8 hours.  Glucose, capillary     Status: Abnormal   Collection Time: 02/10/21 11:28 AM  Result Value Ref Range   Glucose-Capillary 115 (H) 70 - 99 mg/dL    Comment: Glucose reference range applies only to samples taken after fasting for at least 8 hours.  Glucose, capillary     Status: Abnormal   Collection Time: 02/10/21  3:58 PM  Result Value Ref Range   Glucose-Capillary 103 (H) 70 - 99 mg/dL    Comment: Glucose reference range applies only to samples taken after fasting for at least 8  hours.  Comprehensive metabolic panel     Status: Abnormal   Collection Time: 02/10/21  5:41 PM  Result Value Ref Range   Sodium 134 (L) 135 - 145 mmol/L   Potassium 3.8 3.5 - 5.1 mmol/L   Chloride 106 98 - 111 mmol/L   CO2 21 (L) 22 - 32 mmol/L   Glucose, Bld 89 70 - 99 mg/dL    Comment: Glucose reference range applies only to samples taken after fasting for at least 8 hours.   BUN 11 6 - 20 mg/dL   Creatinine, Ser 2.720.56 0.44 - 1.00 mg/dL   Calcium 8.3 (L) 8.9 - 10.3 mg/dL   Total Protein 6.8 6.5 - 8.1 g/dL   Albumin 2.7 (L) 3.5 - 5.0 g/dL   AST 38 15 - 41 U/L   ALT 57 (H) 0 - 44 U/L   Alkaline Phosphatase 93 38 - 126 U/L   Total Bilirubin 0.4 0.3 - 1.2 mg/dL   GFR, Estimated >53>60 >66>60 mL/min    Comment: (NOTE) Calculated using the CKD-EPI Creatinine Equation (2021)    Anion gap 7 5 - 15    Comment: Performed at [facility],  [address] [place], [place] [address]  Glucose, capillary     Status: None   Collection Time: 02/10/21  9:52 PM  Result Value Ref Range   Glucose-Capillary 84 70 - 99 mg/dL    Comment: Glucose reference range applies only to samples taken after fasting for at least 8 hours.  Comprehensive metabolic panel     Status: Abnormal   Collection Time: 02/11/21  5:30 AM  Result Value Ref Range   Sodium 134 (L) 135 - 145 mmol/L   Potassium 3.6 3.5 - 5.1 mmol/L   Chloride 106 98 - 111 mmol/L   CO2 22 22 - 32 mmol/L   Glucose, Bld 72 70 - 99 mg/dL    Comment: Glucose reference range applies only to samples taken after fasting for at least 8 hours.   BUN 12 6 - 20 mg/dL   Creatinine, Ser 5.39 0.44 - 1.00 mg/dL   Calcium 8.3 (L) 8.9 - 10.3 mg/dL   Total Protein 6.3 (L) 6.5 - 8.1 g/dL   Albumin 2.5 (L) 3.5 - 5.0 g/dL   AST 35 15 - 41 U/L   ALT 57 (H) 0 - 44 U/L   Alkaline Phosphatase 99 38 - 126 U/L   Total Bilirubin 0.4 0.3 - 1.2 mg/dL   GFR, Estimated >76 >73 mL/min    Comment: (NOTE) Calculated using the CKD-EPI Creatinine Equation (2021)     Anion gap 6 5 - 15    Comment: Performed at [facility], [address] [place], [place] [address]  Glucose, capillary     Status: None   Collection Time: 02/11/21  5:36 AM  Result Value Ref Range   Glucose-Capillary 78 70 - 99 mg/dL    Comment: Glucose reference range applies only to samples taken after fasting for at least 8 hours.  Glucose, capillary     Status: Abnormal   Collection Time: 02/11/21 10:12 AM  Result Value Ref Range   Glucose-Capillary 120 (H) 70 - 99 mg/dL    Comment: Glucose reference range applies only to samples taken after fasting for at least 8 hours.    Assessment:   30 y.o. [id number] at 33 weeks 2 days gestation of pregnancy, with Chronic HTN and superimposed pre eclampsia Chronic abruption Insulin dependent GDM Morbid obesity  Plan:   1) continue current orders 2) NSTs daily BPP ordered for today and q 3 days.  Continues on Procardia 60 mg daily-Monitor blood pressures carefully. SCD hose in use Her CS is scheduled for 02/15/2021-would move this up for increased vaginal bleeding or should her labwork indicate the need for delivery.  Mirna Mires, CNM  02/11/2021 12:12 PM

## 2021-02-12 ENCOUNTER — Encounter: Payer: Medicaid Other | Admitting: Obstetrics and Gynecology

## 2021-02-12 DIAGNOSIS — O1403 Mild to moderate pre-eclampsia, third trimester: Secondary | ICD-10-CM | POA: Diagnosis not present

## 2021-02-12 DIAGNOSIS — O24414 Gestational diabetes mellitus in pregnancy, insulin controlled: Secondary | ICD-10-CM | POA: Diagnosis not present

## 2021-02-12 DIAGNOSIS — Z3A32 32 weeks gestation of pregnancy: Secondary | ICD-10-CM | POA: Diagnosis not present

## 2021-02-12 DIAGNOSIS — O4593 Premature separation of placenta, unspecified, third trimester: Secondary | ICD-10-CM | POA: Diagnosis not present

## 2021-02-12 LAB — COMPREHENSIVE METABOLIC PANEL
ALT: 63 U/L — ABNORMAL HIGH (ref 0–44)
AST: 38 U/L (ref 15–41)
Albumin: 2.6 g/dL — ABNORMAL LOW (ref 3.5–5.0)
Alkaline Phosphatase: 103 U/L (ref 38–126)
Anion gap: 6 (ref 5–15)
BUN: 12 mg/dL (ref 6–20)
CO2: 22 mmol/L (ref 22–32)
Calcium: 8.5 mg/dL — ABNORMAL LOW (ref 8.9–10.3)
Chloride: 108 mmol/L (ref 98–111)
Creatinine, Ser: 0.53 mg/dL (ref 0.44–1.00)
GFR, Estimated: >60 mL/min (ref >60)
Glucose, Bld: 98 mg/dL (ref 70–99)
Potassium: 3.7 mmol/L (ref 3.5–5.1)
Sodium: 136 mmol/L (ref 135–145)
Total Bilirubin: 0.4 mg/dL (ref 0.3–1.2)
Total Protein: 6.4 g/dL — ABNORMAL LOW (ref 6.5–8.1)

## 2021-02-12 LAB — GLUCOSE, CAPILLARY
Glucose-Capillary: 109 mg/dL — ABNORMAL HIGH (ref 70–99)
Glucose-Capillary: 140 mg/dL — ABNORMAL HIGH (ref 70–99)
Glucose-Capillary: 93 mg/dL (ref 70–99)
Glucose-Capillary: 86 mg/dL (ref 70–99)

## 2021-02-12 LAB — TYPE AND SCREEN
ABO/RH(D): A POS
Antibody Screen: NEGATIVE

## 2021-02-12 MED ORDER — INSULIN DETEMIR 100 UNIT/ML ~~LOC~~ SOLN
6.0000 [IU] | Freq: Every morning | SUBCUTANEOUS | Status: DC
  Administered 2021-02-13 – 2021-02-15 (×3): 6 [IU] via SUBCUTANEOUS
  Filled 2021-02-12 (×4): qty 0.06

## 2021-02-12 MED ORDER — INSULIN DETEMIR 100 UNIT/ML ~~LOC~~ SOLN
3.0000 [IU] | Freq: Every day | SUBCUTANEOUS | Status: DC
  Administered 2021-02-12 – 2021-02-15 (×4): 3 [IU] via SUBCUTANEOUS
  Filled 2021-02-12 (×5): qty 0.03

## 2021-02-12 NOTE — Progress Notes (Signed)
Subjective:  She is feeling well. She reports small pink discharge with wiping. No blood on pad. She reports she saw floaters with standing this morning. She denies RUQ pain. She notes normal fetal movements. Some lower back pain, but might be there bed which is bothering her back. She is tolerating a regular diet. Ambulating well.  Objective:   Blood pressure (!) 128/92, pulse 73, temperature 98.5 F (36.9 C), temperature source Oral, resp. rate 18, height 5\' 2"  (1.575 m), weight 118.8 kg, last menstrual period 06/23/2020, SpO2 96 %, unknown if currently breastfeeding.  General: NAD Pulmonary: no increased work of breathing Abdomen: non-distended, non-tender Uterus:  gravid Extremities: no edema, no erythema, no tenderness, no signs of DVT  Results for orders placed or performed during the hospital encounter of 02/01/21 (from the past 72 hour(s))  Glucose, capillary     Status: Abnormal   Collection Time: 02/09/21 11:49 AM  Result Value Ref Range   Glucose-Capillary 108 (H) 70 - 99 mg/dL    Comment: Glucose reference range applies only to samples taken after fasting for at least 8 hours.   Comment 1 Notify RN   Glucose, capillary     Status: Abnormal   Collection Time: 02/09/21  3:25 PM  Result Value Ref Range   Glucose-Capillary 104 (H) 70 - 99 mg/dL    Comment: Glucose reference range applies only to samples taken after fasting for at least 8 hours.  Glucose, capillary     Status: None   Collection Time: 02/09/21 10:19 PM  Result Value Ref Range   Glucose-Capillary 88 70 - 99 mg/dL    Comment: Glucose reference range applies only to samples taken after fasting for at least 8 hours.  Type and screen     Status: None   Collection Time: 02/10/21  2:25 AM  Result Value Ref Range   ABO/RH(D) A POS    Antibody Screen NEG    Sample Expiration      02/13/2021,2359 Performed at [facility], [address] [place], [place] [address]   Comprehensive metabolic panel      Status: Abnormal   Collection Time: 02/10/21  2:25 AM  Result Value Ref Range   Sodium 135 135 - 145 mmol/L   Potassium 3.6 3.5 - 5.1 mmol/L   Chloride 107 98 - 111 mmol/L   CO2 22 22 - 32 mmol/L   Glucose, Bld 79 70 - 99 mg/dL    Comment: Glucose reference range applies only to samples taken after fasting for at least 8 hours.   BUN 12 6 - 20 mg/dL   Creatinine, Ser 6.040.39 (L) 0.44 - 1.00 mg/dL   Calcium 8.3 (L) 8.9 - 10.3 mg/dL   Total Protein 6.5 6.5 - 8.1 g/dL   Albumin 2.7 (L) 3.5 - 5.0 g/dL   AST 34 15 - 41 U/L   ALT 50 (H) 0 - 44 U/L   Alkaline Phosphatase 90 38 - 126 U/L   Total Bilirubin 0.2 (L) 0.3 - 1.2 mg/dL   GFR, Estimated >54>60 >09>60 mL/min    Comment: (NOTE) Calculated using the CKD-EPI Creatinine Equation (2021)    Anion gap 6 5 - 15    Comment: Performed at [facility], [address] [place], [place] [address]  CBC     Status: Abnormal   Collection Time: 02/10/21  2:25 AM  Result Value Ref Range   WBC 8.4 4.0 - 10.5 K/uL   RBC 3.93 3.87 - 5.11 MIL/uL  Hemoglobin 10.8 (L) 12.0 - 15.0 g/dL   HCT 68.0 (L) 32.1 - 22.4 %   MCV 78.6 (L) 80.0 - 100.0 fL   MCH 27.5 26.0 - 34.0 pg   MCHC 35.0 30.0 - 36.0 g/dL   RDW 82.5 00.3 - 70.4 %   Platelets 306 150 - 400 K/uL   nRBC 0.0 0.0 - 0.2 %    Comment: Performed at [facility], [address] [place], [place] [address]  Fibrinogen     Status: Abnormal   Collection Time: 02/10/21  2:25 AM  Result Value Ref Range   Fibrinogen 665 (H) 210 - 475 mg/dL    Comment: Performed at [facility], [address] [place], [place] [address]  Protime-INR     Status: None   Collection Time: 02/10/21  2:25 AM  Result Value Ref Range   Prothrombin Time 12.7 11.4 - 15.2 seconds   INR 1.0 0.8 - 1.2    Comment: (NOTE) INR goal varies based on device and disease states. Performed at [facility], [address] [place], [place] [address]   APTT     Status: None   Collection Time: 02/10/21   2:25 AM  Result Value Ref Range   aPTT 32 24 - 36 seconds    Comment: Performed at [facility], [address] [place], [place] [address]  Kleihauer-Betke stain     Status: None   Collection Time: 02/10/21  2:25 AM  Result Value Ref Range   Fetal Cells % 0.3 %   Quantitation Fetal Hemoglobin 0.0015 mL    Comment: UP TO   # Vials RhIg NOT INDICATED     Comment: CONFIRMED BY [facility] Performed at [facility], [address] [place], [place] [address]   Urinalysis, Routine w reflex microscopic Urine, Clean Catch     Status: Abnormal   Collection Time: 02/10/21  7:58 AM  Result Value Ref Range   Color, Urine YELLOW (A) YELLOW   APPearance CLEAR (A) CLEAR   Specific Gravity, Urine 1.016 1.005 - 1.030   pH 7.0 5.0 - 8.0   Glucose, UA NEGATIVE NEGATIVE mg/dL   Hgb urine dipstick MODERATE (A) NEGATIVE   Bilirubin Urine NEGATIVE NEGATIVE   Ketones, ur NEGATIVE NEGATIVE mg/dL   Protein, ur 505 (A) NEGATIVE mg/dL   Nitrite NEGATIVE NEGATIVE   Leukocytes,Ua NEGATIVE NEGATIVE   RBC / HPF 0-5 0 - 5 RBC/hpf   WBC, UA 0-5 0 - 5 WBC/hpf   Bacteria, UA RARE (A) NONE SEEN   Squamous Epithelial / LPF 0-5 0 - 5   Mucus PRESENT    Granular Casts, UA PRESENT     Comment: Performed at [facility], [address], [place], [place] [address]  Protein / creatinine ratio, urine     Status: Abnormal   Collection Time: 02/10/21  7:58 AM  Result Value Ref Range   Creatinine, Urine 102 mg/dL   Total Protein, Urine 130 mg/dL    Comment: NO NORMAL RANGE ESTABLISHED FOR THIS TEST   Protein Creatinine Ratio 1.27 (H) 0.00 - 0.15 mg/mg[Cre]    Comment: Performed at [facility], [address] [place], [place] [address]  Glucose, capillary     Status: None   Collection Time: 02/10/21  8:25 AM  Result Value Ref Range   Glucose-Capillary 75 70 - 99 mg/dL    Comment: Glucose reference range applies only to samples taken after fasting for at least 8  hours.   Glucose, capillary     Status: Abnormal   Collection Time: 02/10/21 11:28 AM  Result Value Ref Range   Glucose-Capillary 115 (H) 70 - 99 mg/dL    Comment: Glucose reference range applies only to samples taken after fasting for at least 8 hours.  Glucose, capillary     Status: Abnormal   Collection Time: 02/10/21  3:58 PM  Result Value Ref Range   Glucose-Capillary 103 (H) 70 - 99 mg/dL    Comment: Glucose reference range applies only to samples taken after fasting for at least 8 hours.  Comprehensive metabolic panel     Status: Abnormal   Collection Time: 02/10/21  5:41 PM  Result Value Ref Range   Sodium 134 (L) 135 - 145 mmol/L   Potassium 3.8 3.5 - 5.1 mmol/L   Chloride 106 98 - 111 mmol/L   CO2 21 (L) 22 - 32 mmol/L   Glucose, Bld 89 70 - 99 mg/dL    Comment: Glucose reference range applies only to samples taken after fasting for at least 8 hours.   BUN 11 6 - 20 mg/dL   Creatinine, Ser 6.56 0.44 - 1.00 mg/dL   Calcium 8.3 (L) 8.9 - 10.3 mg/dL   Total Protein 6.8 6.5 - 8.1 g/dL   Albumin 2.7 (L) 3.5 - 5.0 g/dL   AST 38 15 - 41 U/L   ALT 57 (H) 0 - 44 U/L   Alkaline Phosphatase 93 38 - 126 U/L   Total Bilirubin 0.4 0.3 - 1.2 mg/dL   GFR, Estimated >81 >27 mL/min    Comment: (NOTE) Calculated using the CKD-EPI Creatinine Equation (2021)    Anion gap 7 5 - 15    Comment: Performed at [facility], [address] [place], [place] [address]  Glucose, capillary     Status: None   Collection Time: 02/10/21  9:52 PM  Result Value Ref Range   Glucose-Capillary 84 70 - 99 mg/dL    Comment: Glucose reference range applies only to samples taken after fasting for at least 8 hours.  Comprehensive metabolic panel     Status: Abnormal   Collection Time: 02/11/21  5:30 AM  Result Value Ref Range   Sodium 134 (L) 135 - 145 mmol/L   Potassium 3.6 3.5 - 5.1 mmol/L   Chloride 106 98 - 111 mmol/L   CO2 22 22 - 32 mmol/L   Glucose, Bld 72 70 - 99 mg/dL    Comment: Glucose  reference range applies only to samples taken after fasting for at least 8 hours.   BUN 12 6 - 20 mg/dL   Creatinine, Ser 1.74 0.44 - 1.00 mg/dL   Calcium 8.3 (L) 8.9 - 10.3 mg/dL   Total Protein 6.3 (L) 6.5 - 8.1 g/dL   Albumin 2.5 (L) 3.5 - 5.0 g/dL   AST 35 15 - 41 U/L   ALT 57 (H) 0 - 44 U/L   Alkaline Phosphatase 99 38 - 126 U/L   Total Bilirubin 0.4 0.3 - 1.2 mg/dL   GFR, Estimated >94 >49 mL/min    Comment: (NOTE) Calculated using the CKD-EPI Creatinine Equation (2021)    Anion gap 6 5 - 15    Comment: Performed at [facility], [address] [place], [place] [address]  Glucose, capillary     Status: None   Collection Time: 02/11/21  5:36 AM  Result Value Ref Range   Glucose-Capillary 78 70 - 99 mg/dL  Comment: Glucose reference range applies only to samples taken after fasting for at least 8 hours.  Glucose, capillary     Status: Abnormal   Collection Time: 02/11/21 10:12 AM  Result Value Ref Range   Glucose-Capillary 120 (H) 70 - 99 mg/dL    Comment: Glucose reference range applies only to samples taken after fasting for at least 8 hours.  Glucose, capillary     Status: None   Collection Time: 02/11/21  2:34 PM  Result Value Ref Range   Glucose-Capillary 99 70 - 99 mg/dL    Comment: Glucose reference range applies only to samples taken after fasting for at least 8 hours.  Glucose, capillary     Status: None   Collection Time: 02/11/21  9:29 PM  Result Value Ref Range   Glucose-Capillary 84 70 - 99 mg/dL    Comment: Glucose reference range applies only to samples taken after fasting for at least 8 hours.  Comprehensive metabolic panel     Status: Abnormal   Collection Time: 02/12/21  6:07 AM  Result Value Ref Range   Sodium 136 135 - 145 mmol/L   Potassium 3.7 3.5 - 5.1 mmol/L   Chloride 108 98 - 111 mmol/L   CO2 22 22 - 32 mmol/L   Glucose, Bld 98 70 - 99 mg/dL    Comment: Glucose reference range applies only to samples taken after fasting for at  least 8 hours.   BUN 12 6 - 20 mg/dL   Creatinine, Ser 9.44 0.44 - 1.00 mg/dL   Calcium 8.5 (L) 8.9 - 10.3 mg/dL   Total Protein 6.4 (L) 6.5 - 8.1 g/dL   Albumin 2.6 (L) 3.5 - 5.0 g/dL   AST 38 15 - 41 U/L   ALT 63 (H) 0 - 44 U/L   Alkaline Phosphatase 103 38 - 126 U/L   Total Bilirubin 0.4 0.3 - 1.2 mg/dL   GFR, Estimated >96 >75 mL/min    Comment: (NOTE) Calculated using the CKD-EPI Creatinine Equation (2021)    Anion gap 6 5 - 15    Comment: Performed at [facility], [address], [place], [place] [address]    Assessment:   30 y.o. [phone] at [redacted]w[redacted]d with  preeclampsia and vaginal bleeding in pregnancy- hospitalday # 12  Plan:   1)Preeclampsia- BP controlled with procardia 60 mg. New onset of seeing spots once today with standing. If this occurs again will consider initiation of magnesium.   2) Abruption seen on [place] earlier in hospital admission. She reports typical pink discharge with wiping.  Will continue to monitoring.   3) Comfort measures-We discussed moving to a room with a shower. Discussed daily wheelchair rides outside. She could visit her 32 yo outside.  4) Daily NST and BPP planned for every 3 days. Marland Kitchen  5) Gestational diabetic- well controlled on Levemir 6 units BID and sliding scale  6) SCDs for DVT prophylaxis  7) Continue inpatient care as planned until 34 weeks. cesraen section planned for Monday.   Adelene Idler MD [facility], [facility] 02/12/2021 10:14 AM

## 2021-02-12 NOTE — Progress Notes (Signed)
   02/12/21 1400  Clinical Encounter Type  Visited With Patient  Visit Type Follow-up;Spiritual support  Referral From Nurse  Consult/Referral To Chaplain  Spiritual Encounters  Spiritual Needs Emotional  Stress Factors  Patient Stress Factors Health changes  Chaplain Rut Betterton visited room 341A for a FU from yesterdays visit. Pt, Ms. Doloris Servantes who is 8 months pregnant is still here for observation due to some difficulties with the pregnancy.  Pt was asleep at the present time.

## 2021-02-13 ENCOUNTER — Other Ambulatory Visit: Admission: RE | Admit: 2021-02-13 | Payer: Medicaid Other | Source: Ambulatory Visit

## 2021-02-13 ENCOUNTER — Inpatient Hospital Stay: Payer: Medicaid Other

## 2021-02-13 DIAGNOSIS — O1403 Mild to moderate pre-eclampsia, third trimester: Secondary | ICD-10-CM | POA: Diagnosis not present

## 2021-02-13 DIAGNOSIS — O24414 Gestational diabetes mellitus in pregnancy, insulin controlled: Secondary | ICD-10-CM | POA: Diagnosis not present

## 2021-02-13 DIAGNOSIS — O4693 Antepartum hemorrhage, unspecified, third trimester: Secondary | ICD-10-CM | POA: Diagnosis not present

## 2021-02-13 DIAGNOSIS — Z3A33 33 weeks gestation of pregnancy: Secondary | ICD-10-CM | POA: Diagnosis not present

## 2021-02-13 LAB — COMPREHENSIVE METABOLIC PANEL
ALT: 68 U/L — ABNORMAL HIGH (ref 0–44)
AST: 39 U/L (ref 15–41)
Albumin: 2.6 g/dL — ABNORMAL LOW (ref 3.5–5.0)
Alkaline Phosphatase: 105 U/L (ref 38–126)
Anion gap: 6 (ref 5–15)
BUN: 12 mg/dL (ref 6–20)
CO2: 21 mmol/L — ABNORMAL LOW (ref 22–32)
Calcium: 8.5 mg/dL — ABNORMAL LOW (ref 8.9–10.3)
Chloride: 108 mmol/L (ref 98–111)
Creatinine, Ser: 0.53 mg/dL (ref 0.44–1.00)
GFR, Estimated: >60 mL/min (ref >60)
Glucose, Bld: 75 mg/dL (ref 70–99)
Potassium: 4 mmol/L (ref 3.5–5.1)
Sodium: 135 mmol/L (ref 135–145)
Total Bilirubin: 0.5 mg/dL (ref 0.3–1.2)
Total Protein: 6.5 g/dL (ref 6.5–8.1)

## 2021-02-13 LAB — GLUCOSE, CAPILLARY
Glucose-Capillary: 79 mg/dL (ref 70–99)
Glucose-Capillary: 93 mg/dL (ref 70–99)
Glucose-Capillary: 118 mg/dL — ABNORMAL HIGH (ref 70–99)
Glucose-Capillary: 102 mg/dL — ABNORMAL HIGH (ref 70–99)

## 2021-02-13 MED ORDER — INSULIN ASPART 100 UNIT/ML IJ SOLN
0.0000 [IU] | Freq: Three times a day (TID) | INTRAMUSCULAR | Status: DC
  Administered 2021-02-14 – 2021-02-18 (×8): 3 [IU] via SUBCUTANEOUS
  Filled 2021-02-13 (×8): qty 1

## 2021-02-13 NOTE — Progress Notes (Signed)
Bedside US performed Vertex presentation, nuchal cord observed.  Active movements of limbs and body. Breathing movements observed.  Subjectively normal amniotic fluid volume.   Discussed that a nuchal cord is common in pregnancy and occurs in roughly 30% of pregnancies. Reassurance given.   Adelene Idler MD, [facility], [facility] 02/13/2021 6:33 PM

## 2021-02-13 NOTE — Progress Notes (Signed)
Pt leaving unit for BPP and [place].  Jane CNM rounding and aware.

## 2021-02-13 NOTE — Progress Notes (Addendum)
Subjective:  Patient has no complaints. She reports good fetal movement. She denies contractions or leakage of fluid. She has seen brown blood when wiping- no more red or pink. She is tolerating a regular diet. She is ambulating in the room and voiding without difficulty.   Objective:   BP (!) 132/97 (BP Location: Right Arm)   Pulse 84   Temp 98.5 F (36.9 C) (Oral)   Resp 17   Ht 5\' 2"  (1.575 m)   Wt 118.8 kg   LMP 06/23/2020   SpO2 97%   BMI 47.92 kg/m    General: NAD Pulmonary: no increased work of breathing Abdomen: non-distended, non-tender Uterus:  gravid Extremities: no edema, no erythema, no tenderness, no signs of DVT  NST: reactive, 135 bpm, moderate variability, +accelerations, -decelerations Toco: negative   CLINICAL DATA:  Placental abruption.  EXAM: LIMITED OBSTETRIC ULTRASOUND AND BIOPHYSICAL PROFILE  FINDINGS: Number of Fetuses: 1  Heart Rate:  150 bpm  Movement: Yes  Presentation: Cephalic  Previa: No  Placental Location: Posterior, with probable succenturiate lobe again seen along the right lateral uterine wall; no definite subtle abruption visualized  Amniotic Fluid (Subjective): Within normal limits  AFI 11.1 cm (5%ile= 8.3 cm, 95%= 24.5 cm for 33 wks)  BPD:  8.0cm 32w 0d  BIOPHYSICAL PROFILE  Movement: 2 time: 10 minutes  Breathing: 2  Tone:  2  Amniotic Fluid: 2  Total Score:  8  IMPRESSION: Single living IUP in cephalic presentation. Amniotic fluid volume within normal limits.  Posterior placenta with probable succenturiate lobe again noted.  Biophysical profile score is 8 out of 8.    Results for orders placed or performed during the hospital encounter of 02/01/21 (from the past 72 hour(s))  Comprehensive metabolic panel     Status: Abnormal   Collection Time: 02/10/21  5:41 PM  Result Value Ref Range   Sodium 134 (L) 135 - 145 mmol/L   Potassium 3.8 3.5 - 5.1 mmol/L   Chloride 106 98 -  111 mmol/L   CO2 21 (L) 22 - 32 mmol/L   Glucose, Bld 89 70 - 99 mg/dL    Comment: Glucose reference range applies only to samples taken after fasting for at least 8 hours.   BUN 11 6 - 20 mg/dL   Creatinine, Ser 02/12/21 0.44 - 1.00 mg/dL   Calcium 8.3 (L) 8.9 - 10.3 mg/dL   Total Protein 6.8 6.5 - 8.1 g/dL   Albumin 2.7 (L) 3.5 - 5.0 g/dL   AST 38 15 - 41 U/L   ALT 57 (H) 0 - 44 U/L   Alkaline Phosphatase 93 38 - 126 U/L   Total Bilirubin 0.4 0.3 - 1.2 mg/dL   GFR, Estimated 5.10 >25 mL/min    Comment: (NOTE) Calculated using the CKD-EPI Creatinine Equation (2021)    Anion gap 7 5 - 15    Comment: Performed at [facility], [address] [place], [place]  Glucose, capillary     Status: None   Collection Time: 02/10/21  9:52 PM  Result Value Ref Range   Glucose-Capillary 84 70 - 99 mg/dL    Comment: Glucose reference range applies only to samples taken after fasting for at least 8 hours.  Comprehensive metabolic panel     Status: Abnormal   Collection Time: 02/11/21  5:30 AM  Result Value Ref Range   Sodium 134 (L) 135 - 145 mmol/L   Potassium 3.6 3.5 - 5.1 mmol/L   Chloride 106  98 - 111 mmol/L   CO2 22 22 - 32 mmol/L   Glucose, Bld 72 70 - 99 mg/dL    Comment: Glucose reference range applies only to samples taken after fasting for at least 8 hours.   BUN 12 6 - 20 mg/dL   Creatinine, Ser 6.96 0.44 - 1.00 mg/dL   Calcium 8.3 (L) 8.9 - 10.3 mg/dL   Total Protein 6.3 (L) 6.5 - 8.1 g/dL   Albumin 2.5 (L) 3.5 - 5.0 g/dL   AST 35 15 - 41 U/L   ALT 57 (H) 0 - 44 U/L   Alkaline Phosphatase 99 38 - 126 U/L   Total Bilirubin 0.4 0.3 - 1.2 mg/dL   GFR, Estimated >78 >93 mL/min    Comment: (NOTE) Calculated using the CKD-EPI Creatinine Equation (2021)    Anion gap 6 5 - 15    Comment: Performed at [facility], [address] [place], [place] [address]  Glucose, capillary     Status: None   Collection Time: 02/11/21  5:36 AM  Result Value Ref Range    Glucose-Capillary 78 70 - 99 mg/dL    Comment: Glucose reference range applies only to samples taken after fasting for at least 8 hours.  Glucose, capillary     Status: Abnormal   Collection Time: 02/11/21 10:12 AM  Result Value Ref Range   Glucose-Capillary 120 (H) 70 - 99 mg/dL    Comment: Glucose reference range applies only to samples taken after fasting for at least 8 hours.  Glucose, capillary     Status: None   Collection Time: 02/11/21  2:34 PM  Result Value Ref Range   Glucose-Capillary 99 70 - 99 mg/dL    Comment: Glucose reference range applies only to samples taken after fasting for at least 8 hours.  Glucose, capillary     Status: None   Collection Time: 02/11/21  9:29 PM  Result Value Ref Range   Glucose-Capillary 84 70 - 99 mg/dL    Comment: Glucose reference range applies only to samples taken after fasting for at least 8 hours.  Comprehensive metabolic panel     Status: Abnormal   Collection Time: 02/12/21  6:07 AM  Result Value Ref Range   Sodium 136 135 - 145 mmol/L   Potassium 3.7 3.5 - 5.1 mmol/L   Chloride 108 98 - 111 mmol/L   CO2 22 22 - 32 mmol/L   Glucose, Bld 98 70 - 99 mg/dL    Comment: Glucose reference range applies only to samples taken after fasting for at least 8 hours.   BUN 12 6 - 20 mg/dL   Creatinine, Ser 5.10 0.44 - 1.00 mg/dL   Calcium 8.5 (L) 8.9 - 10.3 mg/dL   Total Protein 6.4 (L) 6.5 - 8.1 g/dL   Albumin 2.6 (L) 3.5 - 5.0 g/dL   AST 38 15 - 41 U/L   ALT 63 (H) 0 - 44 U/L   Alkaline Phosphatase 103 38 - 126 U/L   Total Bilirubin 0.4 0.3 - 1.2 mg/dL   GFR, Estimated >25 >85 mL/min    Comment: (NOTE) Calculated using the CKD-EPI Creatinine Equation (2021)    Anion gap 6 5 - 15    Comment: Performed at [facility], [address] [place], [place] [address]  Glucose, capillary     Status: Abnormal   Collection Time: 02/12/21 11:58 AM  Result Value Ref Range   Glucose-Capillary 109 (H) 70 - 99 mg/dL  Comment: Glucose  reference range applies only to samples taken after fasting for at least 8 hours.   Comment 1 Notify RN   Glucose, capillary     Status: Abnormal   Collection Time: 02/12/21  3:24 PM  Result Value Ref Range   Glucose-Capillary 140 (H) 70 - 99 mg/dL    Comment: Glucose reference range applies only to samples taken after fasting for at least 8 hours.  Glucose, capillary     Status: None   Collection Time: 02/12/21  8:03 PM  Result Value Ref Range   Glucose-Capillary 93 70 - 99 mg/dL    Comment: Glucose reference range applies only to samples taken after fasting for at least 8 hours.  Type and screen [facility]     Status: None   Collection Time: 02/12/21  9:40 PM  Result Value Ref Range   ABO/RH(D) A POS    Antibody Screen NEG    Sample Expiration      02/15/2021,2359 Performed at [facility], [address] [place], [place] [address]   Glucose, capillary     Status: None   Collection Time: 02/12/21  9:59 PM  Result Value Ref Range   Glucose-Capillary 86 70 - 99 mg/dL    Comment: Glucose reference range applies only to samples taken after fasting for at least 8 hours.  Glucose, capillary     Status: None   Collection Time: 02/13/21  6:15 AM  Result Value Ref Range   Glucose-Capillary 79 70 - 99 mg/dL    Comment: Glucose reference range applies only to samples taken after fasting for at least 8 hours.  Comprehensive metabolic panel     Status: Abnormal   Collection Time: 02/13/21  6:49 AM  Result Value Ref Range   Sodium 135 135 - 145 mmol/L   Potassium 4.0 3.5 - 5.1 mmol/L   Chloride 108 98 - 111 mmol/L   CO2 21 (L) 22 - 32 mmol/L   Glucose, Bld 75 70 - 99 mg/dL    Comment: Glucose reference range applies only to samples taken after fasting for at least 8 hours.   BUN 12 6 - 20 mg/dL   Creatinine, Ser 6.040.53 0.44 - 1.00 mg/dL   Calcium 8.5 (L) 8.9 - 10.3 mg/dL   Total Protein 6.5 6.5 - 8.1 g/dL   Albumin 2.6 (L) 3.5 - 5.0 g/dL   AST 39 15 -  41 U/L   ALT 68 (H) 0 - 44 U/L   Alkaline Phosphatase 105 38 - 126 U/L   Total Bilirubin 0.5 0.3 - 1.2 mg/dL   GFR, Estimated >54>60 >09>60 mL/min    Comment: (NOTE) Calculated using the CKD-EPI Creatinine Equation (2021)    Anion gap 6 5 - 15    Comment: Performed at [facility], [address] [place], [place] [address]  Glucose, capillary     Status: None   Collection Time: 02/13/21 11:16 AM  Result Value Ref Range   Glucose-Capillary 93 70 - 99 mg/dL    Comment: Glucose reference range applies only to samples taken after fasting for at least 8 hours.  Glucose, capillary     Status: Abnormal   Collection Time: 02/13/21  3:14 PM  Result Value Ref Range   Glucose-Capillary 118 (H) 70 - 99 mg/dL    Comment: Glucose reference range applies only to samples taken after fasting for at least 8 hours.   Total time spent with this patient's care and management  15 minutes Assessment:   30 y.o. [id number] at [redacted]w[redacted]d with  preeclampsia and vaginal bleeding in pregnancy- hospitalday # 13  Plan:   1)Preeclampsia- BP controlled with procardia 60 mg.   2) Abruption seen on [place] earlier in hospital admission. She reports brown discharge with wiping.  Will continue to monitor.   3) Daily NST and BPP planned for every 3 days. Marland Kitchen  4) Gestational diabetic- well controlled on Levemir 6 units AM/3 units HS and sliding scale after meals  5) SCDs for DVT prophylaxis  6) Continue inpatient care as planned until 34 weeks. cesarean section planned for Monday.    Parke Poisson, CNM [facility] 02/13/2021, 4:55 PM

## 2021-02-13 NOTE — Progress Notes (Signed)
   02/13/21 1015  Clinical Encounter Type  Visited With Patient  Visit Type Follow-up;Spiritual support  Referral From Chaplain  Consult/Referral To Chaplain  Spiritual Encounters  Spiritual Needs Emotional  Chaplain Rayvn Rickerson visited room 341 for a FU, Pt [name]. [name] was in good spirits this morning and she is doing well. She is scheduled for a C-section on Monday although she is a little nervous she stated, she is yet trusting God and going to relax and not stress. I provided reflective listening, spiritual and emotional support.

## 2021-02-14 DIAGNOSIS — O4593 Premature separation of placenta, unspecified, third trimester: Secondary | ICD-10-CM

## 2021-02-14 DIAGNOSIS — O1403 Mild to moderate pre-eclampsia, third trimester: Secondary | ICD-10-CM

## 2021-02-14 DIAGNOSIS — Z3A33 33 weeks gestation of pregnancy: Secondary | ICD-10-CM

## 2021-02-14 LAB — COMPREHENSIVE METABOLIC PANEL WITH GFR
ALT: 77 U/L — ABNORMAL HIGH (ref 0–44)
AST: 43 U/L — ABNORMAL HIGH (ref 15–41)
Albumin: 2.6 g/dL — ABNORMAL LOW (ref 3.5–5.0)
Alkaline Phosphatase: 107 U/L (ref 38–126)
Anion gap: 9 (ref 5–15)
BUN: 13 mg/dL (ref 6–20)
CO2: 20 mmol/L — ABNORMAL LOW (ref 22–32)
Calcium: 8.7 mg/dL — ABNORMAL LOW (ref 8.9–10.3)
Chloride: 106 mmol/L (ref 98–111)
Creatinine, Ser: 0.59 mg/dL (ref 0.44–1.00)
GFR, Estimated: >60 mL/min
Glucose, Bld: 75 mg/dL (ref 70–99)
Potassium: 4 mmol/L (ref 3.5–5.1)
Sodium: 135 mmol/L (ref 135–145)
Total Bilirubin: 0.4 mg/dL (ref 0.3–1.2)
Total Protein: 6.6 g/dL (ref 6.5–8.1)

## 2021-02-14 LAB — GLUCOSE, CAPILLARY
Glucose-Capillary: 82 mg/dL (ref 70–99)
Glucose-Capillary: 116 mg/dL — ABNORMAL HIGH (ref 70–99)
Glucose-Capillary: 139 mg/dL — ABNORMAL HIGH (ref 70–99)
Glucose-Capillary: 95 mg/dL (ref 70–99)
Glucose-Capillary: 77 mg/dL (ref 70–99)

## 2021-02-14 LAB — SARS CORONAVIRUS 2 (TAT 6-24 HRS): SARS Coronavirus 2: NEGATIVE

## 2021-02-14 NOTE — Progress Notes (Signed)
Spoke with Dr. Bonney Aid about saline lock removed on night shift. Confirmed that the IV could be left out until Sunday night (5/22) in preparation for her c-section on Monday morning (5/23), unless her BP becomes treatable then it would need to be reinserted.   Explained this to pt.

## 2021-02-14 NOTE — Progress Notes (Signed)
NST completed. Per [facility] RN Dr. Bonney Aid is okay with results of strip. No new orders at this time.

## 2021-02-14 NOTE — Progress Notes (Signed)
[facility] nurse in room to complete NST.

## 2021-02-14 NOTE — Progress Notes (Signed)
Obstetric and Gynecology  Subjective  Doing well.  No bleeding overnight.  Denies HA, vision changes, RUQ/epgiastric pain.  +FM, no LOF, no VB, no contractions.    Objective  Vital signs in last 24 hours: Temp:  [97.9 F (36.6 C)-98.5 F (36.9 C)] 97.9 F (36.6 C) (05/21 0802) Pulse Rate:  [72-86] 77 (05/21 0802) Resp:  [16-20] 18 (05/21 0802) BP: (117-147)/(90-106) 131/90 (05/21 0802) SpO2:  [94 %-99 %] 99 % (05/21 0802) Last BM Date: 02/13/21   Intake/Output Summary (Last 24 hours) at 02/14/2021 1025 Last data filed at 02/14/2021 0800 Gross per 24 hour  Intake 960 ml  Output --  Net 960 ml    General: NAD: Pulmonary: no increased work of breathing Abdomen: gravid, non-tender  Labs: Results for orders placed or performed during the hospital encounter of 02/01/21 (from the past 24 hour(s))  Glucose, capillary     Status: None   Collection Time: 02/13/21 11:16 AM  Result Value Ref Range   Glucose-Capillary 93 70 - 99 mg/dL  Glucose, capillary     Status: Abnormal   Collection Time: 02/13/21  3:14 PM  Result Value Ref Range   Glucose-Capillary 118 (H) 70 - 99 mg/dL  SARS CORONAVIRUS 2 (TAT 6-24 HRS) Nasopharyngeal Nasopharyngeal Swab     Status: None   Collection Time: 02/13/21  5:13 PM   Specimen: Nasopharyngeal Swab  Result Value Ref Range   SARS Coronavirus 2 NEGATIVE NEGATIVE  Glucose, capillary     Status: Abnormal   Collection Time: 02/13/21  8:55 PM  Result Value Ref Range   Glucose-Capillary 102 (H) 70 - 99 mg/dL  Comprehensive metabolic panel     Status: Abnormal   Collection Time: 02/14/21  7:33 AM  Result Value Ref Range   Sodium 135 135 - 145 mmol/L   Potassium 4.0 3.5 - 5.1 mmol/L   Chloride 106 98 - 111 mmol/L   CO2 20 (L) 22 - 32 mmol/L   Glucose, Bld 75 70 - 99 mg/dL   BUN 13 6 - 20 mg/dL   Creatinine, Ser 3.66 0.44 - 1.00 mg/dL   Calcium 8.7 (L) 8.9 - 10.3 mg/dL   Total Protein 6.6 6.5 - 8.1 g/dL   Albumin 2.6 (L) 3.5 - 5.0 g/dL   AST 43 (H)  15 - 41 U/L   ALT 77 (H) 0 - 44 U/L   Alkaline Phosphatase 107 38 - 126 U/L   Total Bilirubin 0.4 0.3 - 1.2 mg/dL   GFR, Estimated >29 >47 mL/min   Anion gap 9 5 - 15  Glucose, capillary     Status: None   Collection Time: 02/14/21  8:01 AM  Result Value Ref Range   Glucose-Capillary 82 70 - 99 mg/dL   Comment 1 Notify RN   Glucose, capillary     Status: Abnormal   Collection Time: 02/14/21  9:49 AM  Result Value Ref Range   Glucose-Capillary 116 (H) 70 - 99 mg/dL   Comment 1 Notify RN     Cultures: Results for orders placed or performed during the hospital encounter of 02/01/21  Chlamydia/NGC rt PCR ([facility] only)     Status: None   Collection Time: 02/01/21  9:34 PM   Specimen: Cervical/Vaginal swab; Genital  Result Value Ref Range Status   Specimen source GC/Chlam ENDOCERVICAL  Final   Chlamydia Tr NOT DETECTED NOT DETECTED Final   N gonorrhoeae NOT DETECTED NOT DETECTED Final    Comment: (NOTE) This CT/NG assay has not  been evaluated in patients with a history of  hysterectomy. Performed at [facility], [address] [place], [place] [address]   Group B strep by PCR     Status: Abnormal   Collection Time: 02/01/21  9:34 PM   Specimen: Cervical/Vaginal swab; Genital  Result Value Ref Range Status   Group B strep by PCR POSITIVE (A) NEGATIVE Final    Comment: (NOTE) Intrapartum testing with Xpert GBS assay should be used as an adjunct to other methods available and not used to replace antepartum testing (at 35-[redacted] weeks gestation). Performed at [facility], [address] [place], [place] [address]   SARS CORONAVIRUS 2 (TAT 6-24 HRS) Nasopharyngeal Nasopharyngeal Swab     Status: None   Collection Time: 02/13/21  5:13 PM   Specimen: Nasopharyngeal Swab  Result Value Ref Range Status   SARS Coronavirus 2 NEGATIVE NEGATIVE Final    Comment: (NOTE) SARS-CoV-2 target nucleic acids are NOT DETECTED.  The SARS-CoV-2 RNA is generally detectable in  upper and lower respiratory specimens during the acute phase of infection. Negative results do not preclude SARS-CoV-2 infection, do not rule out co-infections with other pathogens, and should not be used as the sole basis for treatment or other patient management decisions. Negative results must be combined with clinical observations, patient history, and epidemiological information. The expected result is Negative.  Fact Sheet for Patients: HairSlick.no  Fact Sheet for Healthcare Providers: quierodirigir.com  This test is not yet approved or cleared by the [place] FDA and  has been authorized for detection and/or diagnosis of SARS-CoV-2 by FDA under an Emergency Use Authorization (EUA). This EUA will remain  in effect (meaning this test can be used) for the duration of the COVID-19 declaration under Se ction 564(b)(1) of the Act, 21 U.S.C. section 360bbb-3(b)(1), unless the authorization is terminated or revoked sooner.  Performed at [facility], [address], [place], [place] [address]     Imaging:  Assessment   31 y.o. [phone] with preeclampsia and chronic abruption  Plan   - continue daily NST - BPP 02/14/2019 8/8 - BP and labs have been stable - Delivery scheduled for 02/16/2021

## 2021-02-14 NOTE — Progress Notes (Signed)
Pt escorted outside in a wheelchair by nurse tech. Pt requested to get some fresh air. Called and notified Dr. Bonney Aid. He approved this was okay.

## 2021-02-15 LAB — COMPREHENSIVE METABOLIC PANEL
ALT: 81 U/L — ABNORMAL HIGH (ref 0–44)
AST: 42 U/L — ABNORMAL HIGH (ref 15–41)
Albumin: 2.6 g/dL — ABNORMAL LOW (ref 3.5–5.0)
Alkaline Phosphatase: 104 U/L (ref 38–126)
Anion gap: 10 (ref 5–15)
BUN: 13 mg/dL (ref 6–20)
CO2: 20 mmol/L — ABNORMAL LOW (ref 22–32)
Calcium: 8.7 mg/dL — ABNORMAL LOW (ref 8.9–10.3)
Chloride: 105 mmol/L (ref 98–111)
Creatinine, Ser: 0.55 mg/dL (ref 0.44–1.00)
GFR, Estimated: >60 mL/min (ref >60)
Glucose, Bld: 78 mg/dL (ref 70–99)
Potassium: 3.9 mmol/L (ref 3.5–5.1)
Sodium: 135 mmol/L (ref 135–145)
Total Bilirubin: 0.5 mg/dL (ref 0.3–1.2)
Total Protein: 6.6 g/dL (ref 6.5–8.1)

## 2021-02-15 LAB — GLUCOSE, CAPILLARY
Glucose-Capillary: 73 mg/dL (ref 70–99)
Glucose-Capillary: 135 mg/dL — ABNORMAL HIGH (ref 70–99)
Glucose-Capillary: 96 mg/dL (ref 70–99)
Glucose-Capillary: 123 mg/dL — ABNORMAL HIGH (ref 70–99)
Glucose-Capillary: 132 mg/dL — ABNORMAL HIGH (ref 70–99)

## 2021-02-15 LAB — CBC
HCT: 32.6 % — ABNORMAL LOW (ref 36.0–46.0)
Hemoglobin: 11.4 g/dL — ABNORMAL LOW (ref 12.0–15.0)
MCH: 27 pg (ref 26.0–34.0)
MCHC: 35 g/dL (ref 30.0–36.0)
MCV: 77.3 fL — ABNORMAL LOW (ref 80.0–100.0)
Platelets: 306 10*3/uL (ref 150–400)
RBC: 4.22 MIL/uL (ref 3.87–5.11)
RDW: 13.4 % (ref 11.5–15.5)
WBC: 7.3 10*3/uL (ref 4.0–10.5)
nRBC: 0 % (ref 0.0–0.2)

## 2021-02-15 LAB — TYPE AND SCREEN
ABO/RH(D): A POS
Antibody Screen: NEGATIVE

## 2021-02-15 NOTE — Progress Notes (Signed)
Obstetric and Gynecology  Subjective  Doing well no abdominal pain, no contractions.  No increase in vaginal bleeding other than some occasional light brown or pink when toiletting.  Denies HA, vision changes, RUQ or epigastric pain. +FM, no LOF.  Objective  Vital signs in last 24 hours: Temp:  [98 F (36.7 C)-98.6 F (37 C)] 98.3 F (36.8 C) (05/22 0740) Pulse Rate:  [84-91] 90 (05/22 0740) Resp:  [18] 18 (05/22 0740) BP: (122-133)/(76-98) 130/96 (05/22 0740) SpO2:  [93 %-100 %] 98 % (05/22 0740) Last BM Date: 02/13/21   Intake/Output Summary (Last 24 hours) at 02/15/2021 0946 Last data filed at 02/14/2021 1036 Gross per 24 hour  Intake 240 ml  Output --  Net 240 ml    General:NAD Pulmonary: no increased work of breathing Abdomen: gravid, non-tender Extremities: no edema  Labs: Results for orders placed or performed during the hospital encounter of 02/01/21 (from the past 24 hour(s))  Glucose, capillary     Status: Abnormal   Collection Time: 02/14/21  9:49 AM  Result Value Ref Range   Glucose-Capillary 116 (H) 70 - 99 mg/dL   Comment 1 Notify RN   Glucose, capillary     Status: Abnormal   Collection Time: 02/14/21  4:30 PM  Result Value Ref Range   Glucose-Capillary 139 (H) 70 - 99 mg/dL   Comment 1 Notify RN   Glucose, capillary     Status: None   Collection Time: 02/14/21  8:28 PM  Result Value Ref Range   Glucose-Capillary 95 70 - 99 mg/dL  Glucose, capillary     Status: None   Collection Time: 02/14/21 10:00 PM  Result Value Ref Range   Glucose-Capillary 77 70 - 99 mg/dL  Comprehensive metabolic panel     Status: Abnormal   Collection Time: 02/15/21  7:00 AM  Result Value Ref Range   Sodium 135 135 - 145 mmol/L   Potassium 3.9 3.5 - 5.1 mmol/L   Chloride 105 98 - 111 mmol/L   CO2 20 (L) 22 - 32 mmol/L   Glucose, Bld 78 70 - 99 mg/dL   BUN 13 6 - 20 mg/dL   Creatinine, Ser 6.28 0.44 - 1.00 mg/dL   Calcium 8.7 (L) 8.9 - 10.3 mg/dL   Total Protein 6.6  6.5 - 8.1 g/dL   Albumin 2.6 (L) 3.5 - 5.0 g/dL   AST 42 (H) 15 - 41 U/L   ALT 81 (H) 0 - 44 U/L   Alkaline Phosphatase 104 38 - 126 U/L   Total Bilirubin 0.5 0.3 - 1.2 mg/dL   GFR, Estimated >31 >51 mL/min   Anion gap 10 5 - 15  Glucose, capillary     Status: None   Collection Time: 02/15/21  7:52 AM  Result Value Ref Range   Glucose-Capillary 73 70 - 99 mg/dL    Cultures: Results for orders placed or performed during the hospital encounter of 02/01/21  Chlamydia/NGC rt PCR ([facility] only)     Status: None   Collection Time: 02/01/21  9:34 PM   Specimen: Cervical/Vaginal swab; Genital  Result Value Ref Range Status   Specimen source GC/Chlam ENDOCERVICAL  Final   Chlamydia Tr NOT DETECTED NOT DETECTED Final   N gonorrhoeae NOT DETECTED NOT DETECTED Final    Comment: (NOTE) This CT/NG assay has not been evaluated in patients with a history of  hysterectomy. Performed at [facility], [address], [place], [place] [address]   Group B strep by  PCR     Status: Abnormal   Collection Time: 02/01/21  9:34 PM   Specimen: Cervical/Vaginal swab; Genital  Result Value Ref Range Status   Group B strep by PCR POSITIVE (A) NEGATIVE Final    Comment: (NOTE) Intrapartum testing with Xpert GBS assay should be used as an adjunct to other methods available and not used to replace antepartum testing (at 35-[redacted] weeks gestation). Performed at [facility], [address] [place], [place] [address]   SARS CORONAVIRUS 2 (TAT 6-24 HRS) Nasopharyngeal Nasopharyngeal Swab     Status: None   Collection Time: 02/13/21  5:13 PM   Specimen: Nasopharyngeal Swab  Result Value Ref Range Status   SARS Coronavirus 2 NEGATIVE NEGATIVE Final    Comment: (NOTE) SARS-CoV-2 target nucleic acids are NOT DETECTED.  The SARS-CoV-2 RNA is generally detectable in upper and lower respiratory specimens during the acute phase of infection. Negative results do not preclude SARS-CoV-2 infection,  do not rule out co-infections with other pathogens, and should not be used as the sole basis for treatment or other patient management decisions. Negative results must be combined with clinical observations, patient history, and epidemiological information. The expected result is Negative.  Fact Sheet for Patients: HairSlick.no  Fact Sheet for Healthcare Providers: quierodirigir.com  This test is not yet approved or cleared by the [place] FDA and  has been authorized for detection and/or diagnosis of SARS-CoV-2 by FDA under an Emergency Use Authorization (EUA). This EUA will remain  in effect (meaning this test can be used) for the duration of the COVID-19 declaration under Se ction 564(b)(1) of the Act, 21 U.S.C. section 360bbb-3(b)(1), unless the authorization is terminated or revoked sooner.  Performed at [facility], [address] [place], [place] [address]     Imaging:  Assessment   30 y.o. [id number] at [redacted]w[redacted]d by Estimated Date of Delivery: 03/30/21 with preeclampsia, chronic abruption  Plan    1) Preeclampsia - BP have remained well controlled on nifedipine 60mg  - CBC and T&S today in addition to CMP given C-section tomorrow  2) GDM  - continue current dose of novolo  3) Chronic abruption - stable no change in status

## 2021-02-16 ENCOUNTER — Inpatient Hospital Stay: Payer: Medicaid Other | Admitting: Certified Registered Nurse Anesthetist

## 2021-02-16 ENCOUNTER — Encounter
Admission: EM | Disposition: A | Discharge status: Home / Self Care | Payer: Self-pay | Source: Home / Self Care | Attending: Obstetrics & Gynecology

## 2021-02-16 ENCOUNTER — Encounter: Payer: Self-pay | Admitting: Obstetrics and Gynecology

## 2021-02-16 DIAGNOSIS — O1404 Mild to moderate pre-eclampsia, complicating childbirth: Secondary | ICD-10-CM | POA: Diagnosis not present

## 2021-02-16 DIAGNOSIS — O4590 Premature separation of placenta, unspecified, unspecified trimester: Secondary | ICD-10-CM

## 2021-02-16 DIAGNOSIS — Z3A34 34 weeks gestation of pregnancy: Secondary | ICD-10-CM

## 2021-02-16 DIAGNOSIS — O24424 Gestational diabetes mellitus in childbirth, insulin controlled: Secondary | ICD-10-CM | POA: Diagnosis not present

## 2021-02-16 DIAGNOSIS — O4593 Premature separation of placenta, unspecified, third trimester: Secondary | ICD-10-CM | POA: Diagnosis not present

## 2021-02-16 LAB — GLUCOSE, CAPILLARY
Glucose-Capillary: 97 mg/dL (ref 70–99)
Glucose-Capillary: 83 mg/dL (ref 70–99)
Glucose-Capillary: 119 mg/dL — ABNORMAL HIGH (ref 70–99)
Glucose-Capillary: 126 mg/dL — ABNORMAL HIGH (ref 70–99)
Glucose-Capillary: 157 mg/dL — ABNORMAL HIGH (ref 70–99)

## 2021-02-16 SURGERY — Surgical Case
Anesthesia: Spinal

## 2021-02-16 MED ORDER — KETOROLAC TROMETHAMINE 30 MG/ML IJ SOLN: 30.0000 mg | Freq: Four times a day (QID) | INTRAMUSCULAR | Status: DC

## 2021-02-16 MED ORDER — PROPOFOL 10 MG/ML IV BOLUS
INTRAVENOUS | Status: AC
  Filled 2021-02-16: qty 20

## 2021-02-16 MED ORDER — BUPIVACAINE ON-Q PAIN PUMP (FOR ORDER SET NO CHG): INJECTION | Status: AC

## 2021-02-16 MED ORDER — LABETALOL HCL 5 MG/ML IV SOLN: 80.0000 mg | INTRAVENOUS | Status: DC | PRN

## 2021-02-16 MED ORDER — SENNOSIDES-DOCUSATE SODIUM 8.6-50 MG PO TABS
2.0000 | ORAL_TABLET | ORAL | Status: DC
  Administered 2021-02-16 – 2021-02-19 (×4): 2 via ORAL
  Filled 2021-02-16 (×4): qty 2

## 2021-02-16 MED ORDER — LABETALOL HCL 5 MG/ML IV SOLN: 40.0000 mg | INTRAVENOUS | Status: DC | PRN

## 2021-02-16 MED ORDER — POVIDONE-IODINE 10 % EX SWAB: 2.0000 "application " | Freq: Once | CUTANEOUS | Status: DC

## 2021-02-16 MED ORDER — SIMETHICONE 80 MG PO CHEW
80.0000 mg | CHEWABLE_TABLET | Freq: Three times a day (TID) | ORAL | Status: DC
  Administered 2021-02-16 – 2021-02-19 (×9): 80 mg via ORAL
  Filled 2021-02-16 (×9): qty 1

## 2021-02-16 MED ORDER — CALCIUM GLUCONATE 10 % IV SOLN
INTRAVENOUS | Status: AC
  Filled 2021-02-16: qty 10

## 2021-02-16 MED ORDER — OXYTOCIN-SODIUM CHLORIDE 30-0.9 UT/500ML-% IV SOLN
INTRAVENOUS | Status: AC
  Administered 2021-02-16: 2.5 [IU]/h via INTRAVENOUS
  Filled 2021-02-16: qty 500

## 2021-02-16 MED ORDER — LIDOCAINE HCL (PF) 1 % IJ SOLN
INTRAMUSCULAR | Status: DC | PRN
  Administered 2021-02-16: 3 mL

## 2021-02-16 MED ORDER — MORPHINE SULFATE (PF) 0.5 MG/ML IJ SOLN
INTRAMUSCULAR | Status: AC
  Filled 2021-02-16: qty 10

## 2021-02-16 MED ORDER — BUPIVACAINE IN DEXTROSE 0.75-8.25 % IT SOLN
INTRATHECAL | Status: DC | PRN
  Administered 2021-02-16: 1.6 mL via INTRATHECAL

## 2021-02-16 MED ORDER — MENTHOL 3 MG MT LOZG
1.0000 | LOZENGE | OROMUCOSAL | Status: DC | PRN
  Filled 2021-02-16: qty 9

## 2021-02-16 MED ORDER — PHENYLEPHRINE HCL (PRESSORS) 10 MG/ML IV SOLN
INTRAVENOUS | Status: DC | PRN
  Administered 2021-02-16 (×8): 100 ug via INTRAVENOUS

## 2021-02-16 MED ORDER — TRANEXAMIC ACID-NACL 1000-0.7 MG/100ML-% IV SOLN
INTRAVENOUS | Status: AC
  Filled 2021-02-16: qty 100

## 2021-02-16 MED ORDER — BUPIVACAINE 0.5 % ON-Q PUMP DUAL CATH 400 ML
INJECTION | Status: AC | PRN
  Administered 2021-02-16: 400 mL

## 2021-02-16 MED ORDER — SODIUM CHLORIDE 0.9 % IV SOLN
INTRAVENOUS | Status: DC | PRN
  Administered 2021-02-16: 30 ug/min via INTRAVENOUS

## 2021-02-16 MED ORDER — MORPHINE SULFATE (PF) 0.5 MG/ML IJ SOLN
INTRAMUSCULAR | Status: DC | PRN
  Administered 2021-02-16: 100 ug via INTRATHECAL

## 2021-02-16 MED ORDER — BUPIVACAINE HCL (PF) 0.5 % IJ SOLN
INTRAMUSCULAR | Status: AC
  Filled 2021-02-16: qty 30

## 2021-02-16 MED ORDER — LACTATED RINGERS IV SOLN: INTRAVENOUS | Status: DC | PRN

## 2021-02-16 MED ORDER — BUPIVACAINE 0.25 % ON-Q PUMP DUAL CATH 400 ML
400.0000 mL | INJECTION | Status: DC
  Filled 2021-02-16: qty 400

## 2021-02-16 MED ORDER — KETOROLAC TROMETHAMINE 30 MG/ML IJ SOLN
30.0000 mg | Freq: Four times a day (QID) | INTRAMUSCULAR | Status: DC
  Administered 2021-02-16 – 2021-02-17 (×3): 30 mg via INTRAVENOUS
  Filled 2021-02-16 (×3): qty 1

## 2021-02-16 MED ORDER — NALBUPHINE HCL 10 MG/ML IJ SOLN
5.0000 mg | Freq: Once | INTRAMUSCULAR | Status: AC | PRN
  Administered 2021-02-16: 5 mg via INTRAVENOUS
  Filled 2021-02-16: qty 1

## 2021-02-16 MED ORDER — DIBUCAINE (PERIANAL) 1 % EX OINT: 1.0000 "application " | TOPICAL_OINTMENT | CUTANEOUS | Status: DC | PRN

## 2021-02-16 MED ORDER — WITCH HAZEL-GLYCERIN EX PADS: 1.0000 "application " | MEDICATED_PAD | CUTANEOUS | Status: DC | PRN

## 2021-02-16 MED ORDER — OXYTOCIN-SODIUM CHLORIDE 30-0.9 UT/500ML-% IV SOLN
INTRAVENOUS | Status: AC
  Filled 2021-02-16: qty 500

## 2021-02-16 MED ORDER — MISOPROSTOL 200 MCG PO TABS
ORAL_TABLET | ORAL | Status: AC
  Filled 2021-02-16: qty 4

## 2021-02-16 MED ORDER — FENTANYL CITRATE (PF) 100 MCG/2ML IJ SOLN
INTRAMUSCULAR | Status: AC
  Filled 2021-02-16: qty 2

## 2021-02-16 MED ORDER — OXYTOCIN-SODIUM CHLORIDE 30-0.9 UT/500ML-% IV SOLN
2.5000 [IU]/h | INTRAVENOUS | Status: DC
  Administered 2021-02-16: 2.5 [IU]/h via INTRAVENOUS
  Filled 2021-02-16: qty 500

## 2021-02-16 MED ORDER — ONDANSETRON HCL 4 MG/2ML IJ SOLN: 4.0000 mg | Freq: Three times a day (TID) | INTRAMUSCULAR | Status: DC | PRN

## 2021-02-16 MED ORDER — ONDANSETRON HCL 4 MG/2ML IJ SOLN
INTRAMUSCULAR | Status: DC | PRN
  Administered 2021-02-16: 4 mg via INTRAVENOUS

## 2021-02-16 MED ORDER — MAGNESIUM SULFATE 40 GM/1000ML IV SOLN
2.0000 g/h | INTRAVENOUS | Status: AC
  Administered 2021-02-17: 2 g/h via INTRAVENOUS
  Filled 2021-02-16: qty 1000

## 2021-02-16 MED ORDER — HYDRALAZINE HCL 20 MG/ML IJ SOLN: 10.0000 mg | INTRAMUSCULAR | Status: DC | PRN

## 2021-02-16 MED ORDER — NALBUPHINE HCL 10 MG/ML IJ SOLN
5.0000 mg | Freq: Once | INTRAMUSCULAR | Status: AC | PRN
Start: 2021-02-16 — End: 2021-02-16

## 2021-02-16 MED ORDER — LABETALOL HCL 5 MG/ML IV SOLN: 20.0000 mg | INTRAVENOUS | Status: DC | PRN

## 2021-02-16 MED ORDER — SODIUM CHLORIDE 0.9 % IV SOLN
2.0000 g | INTRAVENOUS | Status: AC
  Administered 2021-02-16: 2 g via INTRAVENOUS
  Filled 2021-02-16: qty 2

## 2021-02-16 MED ORDER — BUPIVACAINE HCL 0.5 % IJ SOLN
10.0000 mL | Freq: Once | INTRAMUSCULAR | Status: DC
  Filled 2021-02-16: qty 10

## 2021-02-16 MED ORDER — KETOROLAC TROMETHAMINE 30 MG/ML IJ SOLN
INTRAMUSCULAR | Status: DC | PRN
  Administered 2021-02-16: 30 mg via INTRAVENOUS

## 2021-02-16 MED ORDER — ZOLPIDEM TARTRATE 5 MG PO TABS: 5.0000 mg | ORAL_TABLET | Freq: Every evening | ORAL | Status: DC | PRN

## 2021-02-16 MED ORDER — MAGNESIUM SULFATE BOLUS VIA INFUSION
4.0000 g | Freq: Once | INTRAVENOUS | Status: AC
  Filled 2021-02-16: qty 1000

## 2021-02-16 MED ORDER — OXYTOCIN-SODIUM CHLORIDE 30-0.9 UT/500ML-% IV SOLN
INTRAVENOUS | Status: DC | PRN
  Administered 2021-02-16: 500 mL via INTRAVENOUS

## 2021-02-16 MED ORDER — MAGNESIUM SULFATE 40 GM/1000ML IV SOLN
INTRAVENOUS | Status: AC
  Administered 2021-02-16: 4 g via INTRAVENOUS
  Filled 2021-02-16: qty 1000

## 2021-02-16 MED ORDER — FENTANYL CITRATE (PF) 100 MCG/2ML IJ SOLN
INTRAMUSCULAR | Status: DC | PRN
  Administered 2021-02-16: 15 ug via INTRATHECAL

## 2021-02-16 MED ORDER — ENOXAPARIN SODIUM 40 MG/0.4ML IJ SOSY
40.0000 mg | PREFILLED_SYRINGE | INTRAMUSCULAR | Status: DC
  Administered 2021-02-17 – 2021-02-19 (×3): 40 mg via SUBCUTANEOUS
  Filled 2021-02-16 (×3): qty 0.4

## 2021-02-16 MED ORDER — LACTATED RINGERS IV SOLN: INTRAVENOUS | Status: DC

## 2021-02-16 MED ORDER — SOD CITRATE-CITRIC ACID 500-334 MG/5ML PO SOLN
30.0000 mL | ORAL | Status: AC
  Administered 2021-02-16: 30 mL via ORAL
  Filled 2021-02-16: qty 15

## 2021-02-16 MED ORDER — ACETAMINOPHEN 325 MG PO TABS
650.0000 mg | ORAL_TABLET | Freq: Four times a day (QID) | ORAL | Status: AC
  Administered 2021-02-16 – 2021-02-17 (×4): 650 mg via ORAL
  Filled 2021-02-16 (×4): qty 2

## 2021-02-16 MED ORDER — COCONUT OIL OIL
1.0000 "application " | TOPICAL_OIL | Status: DC | PRN
  Filled 2021-02-16: qty 120

## 2021-02-16 MED ORDER — CARBOPROST TROMETHAMINE 250 MCG/ML IM SOLN
INTRAMUSCULAR | Status: AC
  Filled 2021-02-16: qty 1

## 2021-02-16 MED ORDER — KETOROLAC TROMETHAMINE 30 MG/ML IJ SOLN
INTRAMUSCULAR | Status: AC
  Filled 2021-02-16: qty 1

## 2021-02-16 MED ORDER — TETANUS-DIPHTH-ACELL PERTUSSIS 5-2.5-18.5 LF-MCG/0.5 IM SUSY
0.5000 mL | PREFILLED_SYRINGE | Freq: Once | INTRAMUSCULAR | Status: DC
  Filled 2021-02-16: qty 0.5

## 2021-02-16 MED ORDER — PRENATAL MULTIVITAMIN CH
1.0000 | ORAL_TABLET | Freq: Every day | ORAL | Status: DC
  Administered 2021-02-16 – 2021-02-19 (×4): 1 via ORAL
  Filled 2021-02-16 (×4): qty 1

## 2021-02-16 SURGICAL SUPPLY — 25 items
BARRIER ADHS 3X4 INTERCEED (GAUZE/BANDAGES/DRESSINGS) ×2 IMPLANT
CATH KIT ON-Q SILVERSOAK 5IN (CATHETERS) ×4 IMPLANT
CHLORAPREP W/TINT 26 (MISCELLANEOUS) ×4 IMPLANT
COVER WAND RF STERILE (DRAPES) ×2 IMPLANT
DERMABOND ADVANCED (GAUZE/BANDAGES/DRESSINGS) ×1
DERMABOND ADVANCED .7 DNX12 (GAUZE/BANDAGES/DRESSINGS) ×1 IMPLANT
ELECT CAUTERY BLADE 6.4 (BLADE) ×2 IMPLANT
ELECT REM PT RETURN 9FT ADLT (ELECTROSURGICAL) ×2
ELECTRODE REM PT RTRN 9FT ADLT (ELECTROSURGICAL) ×1 IMPLANT
GLOVE SURG NEOPR MICRO LF SZ8 (GLOVE) ×2 IMPLANT
GOWN STRL REUS W/ TWL LRG LVL3 (GOWN DISPOSABLE) ×1 IMPLANT
GOWN STRL REUS W/ TWL XL LVL3 (GOWN DISPOSABLE) ×2 IMPLANT
GOWN STRL REUS W/TWL LRG LVL3 (GOWN DISPOSABLE) ×1
GOWN STRL REUS W/TWL XL LVL3 (GOWN DISPOSABLE) ×2
MANIFOLD NEPTUNE II (INSTRUMENTS) ×2 IMPLANT
MAT PREVALON FULL STRYKER (MISCELLANEOUS) ×2 IMPLANT
NS IRRIG 1000ML POUR BTL (IV SOLUTION) ×2 IMPLANT
PACK C SECTION AR (MISCELLANEOUS) ×2 IMPLANT
PAD OB MATERNITY 4.3X12.25 (PERSONAL CARE ITEMS) ×2 IMPLANT
PAD PREP 24X41 OB/GYN DISP (PERSONAL CARE ITEMS) ×2 IMPLANT
PENCIL SMOKE EVACUATOR (MISCELLANEOUS) ×2 IMPLANT
SUT MAXON ABS #0 GS21 30IN (SUTURE) ×4 IMPLANT
SUT VIC AB 1 CT1 36 (SUTURE) ×6 IMPLANT
SUT VIC AB 2-0 CT1 36 (SUTURE) ×2 IMPLANT
SUT VIC AB 4-0 FS2 27 (SUTURE) ×2 IMPLANT

## 2021-02-16 NOTE — Discharge Instructions (Signed)
Cesarean Delivery, Care After This sheet gives you information about how to care for yourself after your procedure. Your health care provider may also give you more specific instructions. If you have problems or questions, contact your health care provider. What can I expect after the procedure? After the procedure, it is common to have:  A small amount of blood or clear fluid coming from the incision.  Some redness, swelling, and pain in your incision area.  Some abdominal pain and soreness.  Vaginal bleeding (lochia). Even though you did not have a vaginal delivery, you will still have vaginal bleeding and discharge.  Pelvic cramps.  Fatigue. You may have pain, swelling, and discomfort in the tissue between your vagina and your anus (perineum) if:  Your C-section was unplanned, and you were allowed to labor and push.  An incision was made in the area (episiotomy) or the tissue tore during attempted vaginal delivery. Follow these instructions at home: Incision care  Follow instructions from your health care provider about how to take care of your incision. Make sure you: ? Wash your hands with soap and water before you change your bandage (dressing). If soap and water are not available, use hand sanitizer. ? If you have a dressing, change it or remove it as told by your health care provider. ? Leave stitches (sutures), skin staples, skin glue, or adhesive strips in place. These skin closures may need to stay in place for 2 weeks or longer. If adhesive strip edges start to loosen and curl up, you may trim the loose edges. Do not remove adhesive strips completely unless your health care provider tells you to do that.  Check your incision area every day for signs of infection. Check for: ? More redness, swelling, or pain. ? More fluid or blood. ? Warmth. ? Pus or a bad smell.  Do not take baths, swim, or use a hot tub until your health care provider says it's okay. Ask your health  care provider if you can take showers.  When you cough or sneeze, hug a pillow. This helps with pain and decreases the chance of your incision opening up (dehiscing). Do this until your incision heals.   Medicines  Take over-the-counter and prescription medicines only as told by your health care provider.  If you were prescribed an antibiotic medicine, take it as told by your health care provider. Do not stop taking the antibiotic even if you start to feel better.  Do not drive or use heavy machinery while taking prescription pain medicine. Lifestyle  Do not drink alcohol. This is especially important if you are breastfeeding or taking pain medicine.  Do not use any products that contain nicotine or tobacco, such as cigarettes, e-cigarettes, and chewing tobacco. If you need help quitting, ask your health care provider. Eating and drinking  Drink at least 8 eight-ounce glasses of water every day unless told not to by your health care provider. If you breastfeed, you may need to drink even more water.  Eat high-fiber foods every day. These foods may help prevent or relieve constipation. High-fiber foods include: ? Whole grain cereals and breads. ? Brown rice. ? Beans. ? Fresh fruits and vegetables. Activity  If possible, have someone help you care for your baby and help with household activities for at least a few days after you leave the hospital.  Return to your normal activities as told by your health care provider. Ask your health care provider what activities are safe for   you.  Rest as much as possible. Try to rest or take a nap while your baby is sleeping.  Do not lift anything that is heavier than 10 lbs (4.5 kg), or the limit that you were told, until your health care provider says that it is safe.  Talk with your health care provider about when you can engage in sexual activity. This may depend on your: ? Risk of infection. ? How fast you heal. ? Comfort and desire to  engage in sexual activity.   General instructions  Do not use tampons or douches until your health care provider approves.  Wear loose, comfortable clothing and a supportive and well-fitting bra.  Keep your perineum clean and dry. Wipe from front to back when you use the toilet.  If you pass a blood clot, save it and call your health care provider to discuss. Do not flush blood clots down the toilet before you get instructions from your health care provider.  Keep all follow-up visits for you and your baby as told by your health care provider. This is important. Contact a health care provider if:  You have: ? A fever. ? Bad-smelling vaginal discharge. ? Pus or a bad smell coming from your incision. ? Difficulty or pain when urinating. ? A sudden increase or decrease in the frequency of your bowel movements. ? More redness, swelling, or pain around your incision. ? More fluid or blood coming from your incision. ? A rash. ? Nausea. ? Little or no interest in activities you used to enjoy. ? Questions about caring for yourself or your baby.  Your incision feels warm to the touch.  Your breasts turn red or become painful or hard.  You feel unusually sad or worried.  You vomit.  You pass a blood clot from your vagina.  You urinate more than usual.  You are dizzy or light-headed. Get help right away if:  You have: ? Pain that does not go away or get better with medicine. ? Chest pain. ? Difficulty breathing. ? Blurred vision or spots in your vision. ? Thoughts about hurting yourself or your baby. ? New pain in your abdomen or in one of your legs. ? A severe headache.  You faint.  You bleed from your vagina so much that you fill more than one sanitary pad in one hour. Bleeding should not be heavier than your heaviest period. Summary  After the procedure, it is common to have pain at your incision site, abdominal cramping, and slight bleeding from your vagina.  Check  your incision area every day for signs of infection.  Tell your health care provider about any unusual symptoms.  Keep all follow-up visits for you and your baby as told by your health care provider. This information is not intended to replace advice given to you by your health care provider. Make sure you discuss any questions you have with your health care provider. Document Revised: 03/22/2018 Document Reviewed: 03/22/2018 Elsevier Patient Education  2021 Elsevier Inc.  

## 2021-02-16 NOTE — Anesthesia Procedure Notes (Addendum)
Spinal  Patient location during procedure: OR Start time: 02/16/2021 7:40 AM End time: 02/16/2021 7:42 AM Reason for block: surgical anesthesia Staffing Performed: resident/CRNA  Anesthesiologist: Alver Fisher, MD Resident/CRNA: Jeanine Luz, CRNA Preanesthetic Checklist Completed: patient identified, IV checked, site marked, risks and benefits discussed, surgical consent, monitors and equipment checked, pre-op evaluation and timeout performed Spinal Block Patient position: sitting Prep: DuraPrep Patient monitoring: heart rate, continuous pulse ox and blood pressure Approach: midline Location: L3-4 Injection technique: single-shot Needle Needle type: Quincke  Needle gauge: 22 G Needle length: 12.7 cm Assessment Events: CSF return

## 2021-02-16 NOTE — Transfer of Care (Signed)
Immediate Anesthesia Transfer of Care Note  Patient: Sierra Jordan  Procedure(s) Performed: CESAREAN SECTION (N/A )  Patient Location: Mother/Baby  Anesthesia Type:Spinal  Level of Consciousness: awake, alert  and oriented  Airway & Oxygen Therapy: Patient Spontanous Breathing  Post-op Assessment: Report given to RN and Post -op Vital signs reviewed and stable  Post vital signs: Reviewed and stable  Last Vitals:  Vitals Value Taken Time  BP 130/88   Temp    Pulse 86   Resp 15   SpO2 100     Last Pain:  Vitals:   02/16/21 0717  TempSrc:   PainSc: 0-No pain      Patients Stated Pain Goal: 0 (02/10/21 0802)  Complications: No complications documented.

## 2021-02-16 NOTE — Progress Notes (Signed)
History and Physical Interval Note:  02/16/2021 6:59 AM  Sierra Jordan  has presented today for surgery from antepartum where she has been under inpatient monitoring, with the diagnosis of pre-eclampsia, chronic abruption, diabetes.  The various methods of treatment have been discussed with the patient and family. After consideration of risks, benefits and other options for treatment, the patient has consented to  Procedure(s): CESAREAN SECTION (N/A) as a surgical intervention .  She no longer desires tubal ligation.  The patient's history has been reviewed, patient examined, no change in status, stable for surgery.  I have reviewed the patient's chart and labs.  Questions were answered to the patient's satisfaction.    The risks of cesarean section discussed with the patient included but were not limited to: bleeding which may require transfusion or reoperation; infection which may require antibiotics; injury to bowel, bladder, ureters or other surrounding organs; injury to the fetus; need for additional procedures including hysterectomy in the event of a life-threatening hemorrhage; placental abnormalities wth subsequent pregnancies, incisional problems, thromboembolic phenomenon and other postoperative/anesthesia complications. The patient concurred with the proposed plan, giving informed written consent for the procedure.   Plan post partum magnesium therapy for 24 hours due to her preeclampsia and delivery status.  Will continue Procardia as well. Due to risk factors for DVT (major- BMI>40, recent immobility; moderate- preeclampsia, cesarean delivery), will start postpartum Lovenox 24 hours after surgery and plan for 3-6 weeks. Due to diabetes diagnosed during pregnancy, will discontinue Insulin and maintain sliding scale to see if she continues to have any needs for medical therapy postpartum.  Annamarie Major, MD, [facility], [facility] 02/16/2021  6:59 AM

## 2021-02-16 NOTE — Progress Notes (Signed)
RN obtained pt vitals before bringing pt to the OR, initial blood pressure 160/112, repeat Blood pressure after 15 minutes 144/116. Dr. Tiburcio Pea and Jeanine Luz, CRNA notified of BP and RN was given verbal order to not treat BP and bring pt to the OR as the BP can be a result of pt being nervous about the OR and scheduled procedure. Pt brought to the OR at 0732.

## 2021-02-16 NOTE — Discharge Summary (Addendum)
Postpartum Discharge Summary     Patient Name: Sierra Jordan DOB: Dec 12, 1990 MRN: [id number]  Date of admission: 02/01/2021 Delivery date:02/16/2021  Delivering provider: Gae Dry  Date of discharge: 02/19/2021  Admitting diagnosis: Vaginal bleeding in pregnancy, third trimester [O46.93] Intrauterine pregnancy: [redacted]w[redacted]d    Secondary diagnosis:  Active Problems:   Insulin controlled gestational diabetes mellitus (GDM) in third trimester   Mild preeclampsia, third trimester   Vaginal bleeding in pregnancy, third trimester   Pregnancy with poor obstetric history   Antepartum placental abruption   Cesarean delivery delivered  Additional problems: None    Discharge diagnosis: Preterm Pregnancy Delivered, Preeclampsia (severe) and Diabetes (gestational), Abruption, Prior IUFD                                              Post partum procedures:none Augmentation: N/A Complications: None  Hospital course: Sceduled C/S   30y.o. yo [id number] admitted to the hospital 02/01/2021 for bleeding with chronic but stable abruption as a diagnosis and also was shown to have preeclampsia, for both those reasons was kept inpatient on antepartum until 34 weeks at which time a cesarean section could be performed (prior CS, abruption).  She was monitored and treated for her diabetes as well.  She has prior h/o IUFD, obesity. Membrane Rupture Time/Date: 7:57 AM ,02/16/2021   Delivery Method:C-Section, Low Transverse  Details of operation can be found in separate operative note.  Patient had an uncomplicated postpartum course.  Magnesium for 24 hours.  Lovenox for postpartum DVT prophylaxis based on her risk factors.  Monitoring of blood sugars but cessation of Insulin requirements.  Blood pressure monitoring and continuation of her 30 mg Procardia XL.  She was treated for a HA postpartum day 1 and 3, which was responsive to medication therapy. She is ambulating, tolerating a regular diet, passing  flatus, and urinating well. Patient is discharged home in stable condition on  02/19/21        Newborn Data: Birth date:02/16/2021  Birth time:7:57 AM  Gender:Female  Living status:Living  Apgars:7 ,8  Weight:1670 g     Magnesium Sulfate received: Yes: Seizure prophylaxis BMZ received: Yes Rhophylac:No MMR:No T-DaP:Given prenatally Flu: No Transfusion:No  Physical exam  Vitals:   02/18/21 2302 02/19/21 0321 02/19/21 0808 02/19/21 1200  BP: (!) 137/98 115/74 129/83 (!) 124/93  Pulse: (!) 108 98 96 (!) 107  Resp: 18 16    Temp: 97.9 F (36.6 C) 98.7 F (37.1 C) 98.6 F (37 C) (!) 97.5 F (36.4 C)  TempSrc: Oral Oral Oral Oral  SpO2: 95% 96% 100% 96%  Weight:      Height:       General: alert, cooperative and no distress Lochia: appropriate Uterine Fundus: firm Incision: Healing well with no significant drainage, Dressing is clean, dry, and intact DVT Evaluation: No evidence of DVT seen on physical exam. No significant calf/ankle edema. Labs: Lab Results  Component Value Date   WBC 7.6 02/17/2021   HGB 10.0 (L) 02/17/2021   HCT 28.7 (L) 02/17/2021   MCV 79.5 (L) 02/17/2021   PLT 244 02/17/2021   CMP Latest Ref Rng & Units 02/15/2021  Glucose 70 - 99 mg/dL 78  BUN 6 - 20 mg/dL 13  Creatinine 0.44 - 1.00 mg/dL 0.55  Sodium 135 - 145 mmol/L 135  Potassium 3.5 - 5.1  mmol/L 3.9  Chloride 98 - 111 mmol/L 105  CO2 22 - 32 mmol/L 20(L)  Calcium 8.9 - 10.3 mg/dL 8.7(L)  Total Protein 6.5 - 8.1 g/dL 6.6  Total Bilirubin 0.3 - 1.2 mg/dL 0.5  Alkaline Phos 38 - 126 U/L 104  AST 15 - 41 U/L 42(H)  ALT 0 - 44 U/L 81(H)   Flavia Shipper Score: Edinburgh Postnatal Depression Scale Screening Tool 02/18/2021  I have been able to laugh and see the funny side of things. 1  I have looked forward with enjoyment to things. 0  I have blamed myself unnecessarily when things went wrong. 1  I have been anxious or worried for no good reason. 2  I have felt scared or panicky for no good  reason. 1  Things have been getting on top of me. 1  I have been so unhappy that I have had difficulty sleeping. 0  I have felt sad or miserable. 0  I have been so unhappy that I have been crying. 1  The thought of harming myself has occurred to me. 0  Edinburgh Postnatal Depression Scale Total 7    After visit meds:  Allergies as of 02/19/2021   No Known Allergies     Medication List    STOP taking these medications   Accu-Chek FastClix Lancet Kit   Accu-Chek FastClix Lancets Misc   Accu-Chek Guide test strip Generic drug: glucose blood   Accu-Chek Guide w/Device Kit   aspirin 81 MG chewable tablet   insulin glargine 100 UNIT/ML Solostar Pen Commonly known as: LANTUS   Insulin Pen Needle 32G X 6 MM Misc     TAKE these medications   acetaminophen 325 MG tablet Commonly known as: TYLENOL Take 2 tablets (650 mg total) by mouth every 6 (six) hours as needed for mild pain (temperature > 101.5.). What changed:   when to take this  reasons to take this   butalbital-acetaminophen-caffeine 50-325-40 MG tablet Commonly known as: FIORICET Take 1-2 tablets by mouth every 6 (six) hours as needed for headache.   ferrous sulfate 325 (65 FE) MG tablet Take 1 tablet (325 mg total) by mouth daily with breakfast.   NIFEdipine 60 MG 24 hr tablet Commonly known as: ADALAT CC Take 1 tablet (60 mg total) by mouth daily. Start taking on: Feb 20, 2021 What changed:   medication strength  how much to take   oxyCODONE 5 MG immediate release tablet Commonly known as: Oxy IR/ROXICODONE Take 1-2 tablets (5-10 mg total) by mouth every 4 (four) hours as needed for moderate pain.   prenatal multivitamin Tabs tablet Take 1 tablet by mouth daily at 12 noon.       Discharge home in stable condition Infant Feeding: Bottle Infant Disposition: SCN Discharge instruction: per After Visit Summary and Postpartum booklet. Activity: Advance as tolerated. Pelvic rest for 6 weeks.  Diet:  routine diet Anticipated Birth Control: Unsure Postpartum Appointment:1 week Additional Postpartum F/U: 2 hour GTT Future Appointments: Future Appointments  Date Time Provider [place]  02/24/2021 10:50 AM Gae Dry, MD [facility] None   Follow up Visit:  [place]. Go on 02/24/2021.   Why: incision check & BP check @ 1050 am Contact information: [place] [id number] [place], [place], [facility] 02/19/2021 12:03 PM

## 2021-02-16 NOTE — Progress Notes (Signed)
   02/16/21 1045  Clinical Encounter Type  Visited With Patient  Visit Type Follow-up;Spiritual support  Referral From Chaplain  Consult/Referral To Chaplain  Spiritual Encounters  Spiritual Needs Emotional  Chaplain Jahniyah Revere visited LDR-6, Pt Sierra Jordan. Ms. Sedberry had a C-Section this morning and delivered a baby girl, both are doing well.

## 2021-02-16 NOTE — Anesthesia Preprocedure Evaluation (Signed)
Anesthesia Evaluation  Patient identified by MRN, date of birth, ID band Patient awake    Reviewed: Allergy & Precautions, NPO status , Patient's Chart, lab work & pertinent test results  History of Anesthesia Complications Negative for: history of anesthetic complications  Airway Mallampati: II  TM Distance: >3 FB Neck ROM: Full    Dental no notable dental hx.    Pulmonary neg sleep apnea, neg COPD, former smoker,    breath sounds clear to auscultation- rhonchi (-) wheezing      Cardiovascular hypertension (gHTN, on nifedipine), (-) CAD, (-) Past MI, (-) Cardiac Stents and (-) CABG  Rhythm:Regular Rate:Normal - Systolic murmurs and - Diastolic murmurs    Neuro/Psych neg Seizures negative neurological ROS  negative psych ROS   GI/Hepatic negative GI ROS, Neg liver ROS,   Endo/Other  diabetes, Gestational, Insulin Dependent  Renal/GU negative Renal ROS     Musculoskeletal negative musculoskeletal ROS (+)   Abdominal (+) + obese,   Peds  Hematology  (+) anemia ,   Anesthesia Other Findings Past Medical History: No date: Family history of breast cancer     Comment:  12/21 cancer genetic testing letter sent No date: Gestational diabetes 11/02/2016: Gestational hypertension 08/26/2020: History of pregnancy induced hypertension 04/22/2016: Hyperemesis gravidarum with dehydration No date: Hypertension No date: Medical history non-contributory 01/19/2019: Preeclampsia in postpartum period 01/19/2019: Preeclampsia, severe No date: Stillbirth   Reproductive/Obstetrics (+) Pregnancy                             Lab Results  Component Value Date   WBC 7.3 02/15/2021   HGB 11.4 (L) 02/15/2021   HCT 32.6 (L) 02/15/2021   MCV 77.3 (L) 02/15/2021   PLT 306 02/15/2021    Anesthesia Physical Anesthesia Plan  ASA: III  Anesthesia Plan: Spinal   Post-op Pain Management:    Induction:   PONV  Risk Score and Plan: 2 and Ondansetron  Airway Management Planned: Natural Airway  Additional Equipment:   Intra-op Plan:   Post-operative Plan:   Informed Consent: I have reviewed the patients History and Physical, chart, labs and discussed the procedure including the risks, benefits and alternatives for the proposed anesthesia with the patient or authorized representative who has indicated his/her understanding and acceptance.     Dental advisory given  Plan Discussed with: CRNA and Anesthesiologist  Anesthesia Plan Comments:         Anesthesia Quick Evaluation

## 2021-02-16 NOTE — Op Note (Signed)
Cesarean Section Procedure Note Indications: abruptio placenta, preeclampsia, 34 weeks and prior cesarean section  Pre-operative Diagnosis: Intrauterine pregnancy [redacted]w[redacted]d ;  abruptio placenta, preeclampsia and prior cesarean section Post-operative Diagnosis: same, delivered. Procedure: Low Transverse Cesarean Section Surgeon: Annamarie Major, MD, FACOG Assistant(s): Dr Bonney Aid, No other capable assistant available, in surgery requiring high level assistant. Anesthesia: Spinal anesthesia Estimated Blood Loss:120 mL Complications: None; patient tolerated the procedure well. Disposition: PACU - hemodynamically stable. Condition: stable  Findings: A female infant in the cephalic presentation. "[place]" Amniotic fluid - Clear  Birth weight pending.  Apgars of 7 and 8.  Intact placenta with a three-vessel cord. Grossly normal uterus, tubes and ovaries bilaterally. No significant intraabdominal adhesions were noted.  Procedure Details   The patient was taken to Operating Room, identified as the correct patient and the procedure verified as C-Section Delivery. A Time Out was held and the above information confirmed. After induction of anesthesia, the patient was draped and prepped in the usual sterile manner. A Pfannenstiel incision was made and carried down through the subcutaneous tissue to the fascia. Fascial incision was made and extended transversely with the Mayo scissors. The fascia was separated from the underlying rectus tissue superiorly and inferiorly. The peritoneum was identified and entered bluntly. Peritoneal incision was extended longitudinally. The utero-vesical peritoneal reflection was incised transversely and a bladder flap was created digitally.  A low transverse hysterotomy was made. The fetus was delivered atraumatically. The umbilical cord was clamped x2 and cut and the infant was handed to the awaiting pediatricians. The placenta was removed intact and appeared normal with a  3-vessel cord.  The uterus was exteriorized and cleared of all clot and debris. The hysterotomy was closed with running sutures of 0 Vicryl suture. A second imbricating layer was placed with the same suture. Excellent hemostasis was observed. The uterus was returned to the abdomen. The pelvis was irrigated and again, excellent hemostasis was noted.  The On Q Pain pump System was then placed.  Trocars were placed through the abdominal wall into the subfascial space and these were used to thread the silver soaker cathaters into place.The rectus fascia was then reapproximated with running sutures of Maxon, with careful placement not to incorporate the cathaters. Subcutaneous tissues are then irrigated with saline and hemostasis assured.  Skin is then closed with 4-0 vicryl suture in a subcuticular fashion followed by skin adhesive.  The surgical assistant performed tissue retraction, assistance with suturing, and fundal pressure.   The cathaters are flushed each with 5 mL of Bupivicaine and stabilized into place with dressing. Instrument, sponge, and needle counts were correct prior to the abdominal closure and at the conclusion of the case.  The patient tolerated the procedure well and was transferred to the recovery room in stable condition.   Annamarie Major, MD, [facility], [facility] 02/16/2021  8:31 AM

## 2021-02-17 ENCOUNTER — Encounter: Payer: Medicaid Other | Admitting: Obstetrics and Gynecology

## 2021-02-17 ENCOUNTER — Encounter: Payer: Self-pay | Admitting: Obstetrics & Gynecology

## 2021-02-17 LAB — CBC
HCT: 28.7 % — ABNORMAL LOW (ref 36.0–46.0)
Hemoglobin: 10 g/dL — ABNORMAL LOW (ref 12.0–15.0)
MCH: 27.7 pg (ref 26.0–34.0)
MCHC: 34.8 g/dL (ref 30.0–36.0)
MCV: 79.5 fL — ABNORMAL LOW (ref 80.0–100.0)
Platelets: 244 10*3/uL (ref 150–400)
RBC: 3.61 MIL/uL — ABNORMAL LOW (ref 3.87–5.11)
RDW: 13.6 % (ref 11.5–15.5)
WBC: 7.6 10*3/uL (ref 4.0–10.5)
nRBC: 0 % (ref 0.0–0.2)

## 2021-02-17 LAB — GLUCOSE, CAPILLARY
Glucose-Capillary: 90 mg/dL (ref 70–99)
Glucose-Capillary: 134 mg/dL — ABNORMAL HIGH (ref 70–99)
Glucose-Capillary: 94 mg/dL (ref 70–99)
Glucose-Capillary: 139 mg/dL — ABNORMAL HIGH (ref 70–99)

## 2021-02-17 MED ORDER — OXYCODONE HCL 5 MG PO TABS
5.0000 mg | ORAL_TABLET | ORAL | Status: DC | PRN
Start: 2021-02-17 — End: 2021-02-20
  Administered 2021-02-17: 5 mg via ORAL

## 2021-02-17 MED ORDER — ACETAMINOPHEN 325 MG PO TABS
650.0000 mg | ORAL_TABLET | ORAL | Status: DC | PRN
Start: 2021-02-17 — End: 2021-02-20
  Administered 2021-02-17 – 2021-02-19 (×6): 650 mg via ORAL
  Filled 2021-02-17 (×6): qty 2

## 2021-02-17 MED ORDER — BUTALBITAL-APAP-CAFFEINE 50-325-40 MG PO TABS
1.0000 | ORAL_TABLET | Freq: Once | ORAL | Status: AC
  Administered 2021-02-17: 1 via ORAL
  Filled 2021-02-17: qty 1

## 2021-02-17 MED ORDER — OXYCODONE HCL 5 MG PO TABS
5.0000 mg | ORAL_TABLET | ORAL | Status: DC | PRN
  Administered 2021-02-18 – 2021-02-19 (×3): 10 mg via ORAL
  Filled 2021-02-17 (×3): qty 2
  Filled 2021-02-17: qty 1

## 2021-02-17 MED ORDER — MORPHINE SULFATE (PF) 2 MG/ML IV SOLN: 1.0000 mg | INTRAVENOUS | Status: DC | PRN

## 2021-02-17 MED ORDER — OXYCODONE HCL 5 MG PO TABS
10.0000 mg | ORAL_TABLET | ORAL | Status: DC | PRN
  Administered 2021-02-17 – 2021-02-18 (×5): 10 mg via ORAL
  Filled 2021-02-17 (×5): qty 2

## 2021-02-17 MED ORDER — SIMETHICONE 80 MG PO CHEW: 80.0000 mg | CHEWABLE_TABLET | ORAL | Status: DC | PRN

## 2021-02-17 MED ORDER — DIPHENHYDRAMINE HCL 25 MG PO CAPS: 25.0000 mg | ORAL_CAPSULE | Freq: Four times a day (QID) | ORAL | Status: DC | PRN

## 2021-02-17 NOTE — Lactation Note (Signed)
This note was copied from a baby's chart. Lactation Consultation Note  Patient Name: Girl Carmell Elgin ELMRA'J Date: 02/17/2021   Age:31 hours  Lactation in to see mom for the first time since delivery (no LC on 5/23). Mom delivered preterm at 16w0dvia c/s, and has remained on Mag2+ for 24 hours, just now coming off. Mom was set up with a DEBP and kit yesterday by nursing staff and has stayed diligent in her pumping routine although not yet obtaining any measurable output of colostrum. LC at bedside this morning to teach and demonstrate hand expression. Mom is tired, sore, and works to get in upright position. LC able to hand express colostrum easily, mom still in learning phase, has more difficulty. LC encouraged continued pumping efforts/routine, and hand expression practice throughout the day in between pumping session- mom verbalizes understanding. No other concerns or questions expressed at this time.  LC to follow-up as needed.   SLavonia Drafts5/24/2022, 10:52 AM

## 2021-02-17 NOTE — Progress Notes (Signed)
Patient's Magnesium Sulfate D/C'd at 1000. Pt ambulated to bathroom. BMx1 and voided-quantity sufficient. VSS. Pt complaining of severe headache, rated 9/10 with sitting or standing and 4/10 laying flat.  Pain medication given, MD, and ANMD notified. CRNA at bedside to evaluate pt. Will notify in a few hours if pt status unchanged.

## 2021-02-17 NOTE — Anesthesia Postprocedure Evaluation (Signed)
Anesthesia Post Note  Patient: Sierra Jordan  Procedure(s) Performed: CESAREAN SECTION (N/A )  Patient location during evaluation: Mother Baby Anesthesia Type: Spinal Level of consciousness: awake and alert and oriented Pain management: pain level controlled Vital Signs Assessment: post-procedure vital signs reviewed and stable Respiratory status: respiratory function stable and spontaneous breathing Cardiovascular status: stable Postop Assessment: no headache, no backache, patient able to bend at knees, able to ambulate, adequate PO intake and no apparent nausea or vomiting Anesthetic complications: no   No complications documented.   Last Vitals:  Vitals:   02/17/21 0900 02/17/21 0930  BP: 108/89   Pulse: (!) 101   Resp:  16  Temp:    SpO2: 100%     Last Pain:  Vitals:   02/17/21 0830  TempSrc:   PainSc: 4                  Zachary George

## 2021-02-17 NOTE — Progress Notes (Signed)
Admit Date: 02/01/2021 Today's Date: 02/17/2021  Subjective: Postpartum Day 1: Cesarean Delivery Patient reports incisional pain.  Pt has headache.  Objective: Vital signs in last 24 hours: Temp:  [97.6 F (36.4 C)-98.1 F (36.7 C)] 98.1 F (36.7 C) (05/24 0400) Pulse Rate:  [66-112] 98 (05/24 0700) Resp:  [7-26] 14 (05/24 0400) BP: (104-141)/(75-106) 113/91 (05/24 0700) SpO2:  [93 %-100 %] 96 % (05/24 0700)  Physical Exam:  General: alert, cooperative and no distress Lochia: appropriate Uterine Fundus: firm Incision: healing well, no significant drainage, no dehiscence, no significant erythema DVT Evaluation: No evidence of DVT seen on physical exam. Negative Homan's sign. No cords or calf tenderness. No significant calf/ankle edema.  Recent Labs    02/15/21 0935 02/17/21 0423  HGB 11.4* 10.0*  HCT 32.6* 28.7*    Assessment/Plan: Status post Cesarean section. Doing well postoperatively. Cont magnesiun until 24 hours, then transition off and d/c foley.  Lovenox to start today, advised for 3-6 weeks Cont Procardia Cont BS checks for now.  No Insulin uunless sliding scale for spikes.  Do not anticpate needing after discharge Ambulate.  Diet. Infant doing well in SCN.  Weighed 3-10.  Will require several days if not weeks stay.  Pt understands. Mild anemia, plan iron.  Letitia Libra 02/17/2021, 7:37 AM

## 2021-02-18 LAB — GLUCOSE, CAPILLARY
Glucose-Capillary: 73 mg/dL (ref 70–99)
Glucose-Capillary: 114 mg/dL — ABNORMAL HIGH (ref 70–99)
Glucose-Capillary: 123 mg/dL — ABNORMAL HIGH (ref 70–99)
Glucose-Capillary: 96 mg/dL (ref 70–99)
Glucose-Capillary: 90 mg/dL (ref 70–99)

## 2021-02-18 NOTE — Lactation Note (Signed)
This note was copied from a baby's chart. Lactation Consultation Note  Patient Name: Girl Mikiah Durall AXKPV'V Date: 02/18/2021   Age:31 hours   Lactation follow-up with mom. Mom now on MBU, as of yesterday afternoon/evening. Mom has been set-up with DEBP, and was taught hand expression yesterday, however has not been consistent in efforts. Mom reports being discouraged with "only drops" when using the pump and feels that her nipples are becoming sore. LC and mom discussed feeding plan for infant and mom's overall desire. Mom decides today that she will try pumping bedside with daughter in SCN, and spend time skin to skin, to see if this can improve her output and encourage her to keep up her efforts. LC encouraged use of lanolin or given coconut oil to help with tenderness/soreness, and explained th at this could be happening due to the consistency/thickness of colostrum, and the easier it becomes with transitional and mature milk. LC also educated mom on milk supply and demand and normal course of lactation and just encouraged her to continue her efforts consistently or to let someone know if she no longer wishes to pursue a breastfeeding journey.   Danford Bad 02/18/2021, 9:21 AM

## 2021-02-18 NOTE — Progress Notes (Signed)
   02/18/21 1330  Clinical Encounter Type  Visited With Patient and family together  Visit Type Follow-up  Referral From Nurse  Consult/Referral To Chaplain  Spiritual Encounters  Spiritual Needs Emotional  Chaplain Kayloni Rocco visited [place] and her newborn baby girl. Ms. Texeira was in the NICU holding her baby and they both appeared to be doing well. I provided a ministry of presence and emotional support.

## 2021-02-18 NOTE — Progress Notes (Signed)
Obstetric Postpartum/PostOperative Daily Progress Note Subjective:  30 y.o. [id number] post-operative day # 2 status post repeat cesarean delivery.  She is ambulating, is tolerating po, is voiding spontaneously.  Her pain is well controlled on PO pain medications and with On Q pump. Her lochia is less than menses.   Medications SCHEDULED MEDICATIONS  . enoxaparin (LOVENOX) injection  40 mg Subcutaneous Q24H  . insulin aspart  0-16 Units Subcutaneous TID PC  . NIFEdipine  60 mg Oral Daily  . prenatal multivitamin  1 tablet Oral Q1200  . senna-docusate  2 tablet Oral Q24H  . simethicone  80 mg Oral TID PC  . Tdap  0.5 mL Intramuscular Once    MEDICATION INFUSIONS  . bupivacaine 0.25 % ON-Q pump DUAL CATH 400 mL    . bupivacaine ON-Q pain pump    . lactated ringers Stopped (02/17/21 0957)    PRN MEDICATIONS  acetaminophen, coconut oil, witch hazel-glycerin **AND** dibucaine, diphenhydrAMINE, labetalol **AND** labetalol **AND** labetalol **AND** hydrALAZINE **AND** Measure blood pressure, labetalol **AND** labetalol **AND** labetalol **AND** hydrALAZINE **AND** Measure blood pressure, menthol-cetylpyridinium, morphine injection, ondansetron (ZOFRAN) IV, oxyCODONE **OR** oxyCODONE, oxyCODONE, simethicone, zolpidem    Objective:   Vitals:   02/17/21 1931 02/17/21 2323 02/18/21 0312 02/18/21 0751  BP: 102/65 121/85 129/83 126/84  Pulse: 88 (!) 114 (!) 101 88  Resp: 18 18 20 20   Temp: 97.7 F (36.5 C) 98.2 F (36.8 C) 98.7 F (37.1 C) 97.8 F (36.6 C)  TempSrc: Oral Oral Oral Oral  SpO2: 100% 94% 99% 100%  Weight:      Height:        Current Vital Signs 24h Vital Sign Ranges  T 97.8 F (36.6 C) Temp  Avg: 98.1 F (36.7 C)  Min: 97.7 F (36.5 C)  Max: 98.7 F (37.1 C)  BP 126/84 BP  Min: 102/65  Max: 129/83  HR 88 Pulse  Avg: 105.1  Min: 88  Max: 116  RR 20 Resp  Avg: 18.8  Min: 18  Max: 20  SaO2 100 % Room Air SpO2  Avg: 98.8 %  Min: 94 %  Max: 100 %       24 Hour I/O Current  Shift I/O  Time Ins Outs 05/24 0701 - 05/25 0700 In: 1485 [P.O.:1110; I.V.:225] Out: 1025 [Urine:1025] No intake/output data recorded.  General: NAD Pulmonary: no increased work of breathing Abdomen: non-distended, non-tender, fundus firm at level of umbilicus Inc: Clean/dry/intact, On Q intact Extremities: no edema, no erythema, no tenderness  Labs:  Recent Labs  Lab 02/15/21 0935 02/17/21 0423  WBC 7.3 7.6  HGB 11.4* 10.0*  HCT 32.6* 28.7*  PLT 306 244     Assessment:   30 y.o. 02/19/21 postoperative day # 2 status post repeat cesarean section  Plan:  1) Acute blood loss anemia - hemodynamically stable and asymptomatic - po ferrous sulfate  2) A POS / Rubella 9.20 (11/30 1624)/ Varicella Immune  3) TDAP status given antepartum  4) Lovenox daily for 3-6 weeks  5) Continue BS checks until discharge/sliding scale as needed for spikes  6) Continue procardia  7) Discharge to home tomorrow/newborn in SCN   05-20-2000, CNM 02/18/2021 9:33 AM

## 2021-02-19 ENCOUNTER — Encounter: Payer: Medicaid Other | Admitting: Obstetrics

## 2021-02-19 LAB — GLUCOSE, CAPILLARY: Glucose-Capillary: 74 mg/dL (ref 70–99)

## 2021-02-19 MED ORDER — NIFEDIPINE ER 60 MG PO TB24: 60.0000 mg | ORAL_TABLET | Freq: Every day | ORAL | 0 refills | Status: DC

## 2021-02-19 MED ORDER — ACETAMINOPHEN 325 MG PO TABS: 650.0000 mg | ORAL_TABLET | Freq: Four times a day (QID) | ORAL | Status: DC | PRN

## 2021-02-19 MED ORDER — BUTALBITAL-APAP-CAFFEINE 50-325-40 MG PO TABS: 1.0000 | ORAL_TABLET | Freq: Four times a day (QID) | ORAL | 0 refills | Status: DC | PRN

## 2021-02-19 MED ORDER — BUTALBITAL-APAP-CAFFEINE 50-325-40 MG PO TABS
1.0000 | ORAL_TABLET | Freq: Four times a day (QID) | ORAL | Status: DC | PRN
  Administered 2021-02-19 (×2): 2 via ORAL
  Filled 2021-02-19 (×2): qty 2

## 2021-02-19 MED ORDER — OXYCODONE HCL 5 MG PO TABS: 5.0000 mg | ORAL_TABLET | ORAL | 0 refills | Status: DC | PRN

## 2021-02-19 NOTE — Lactation Note (Signed)
This note was copied from a baby's chart. Lactation Consultation Note  Patient Name: Sierra Jordan [id number] Date: 02/19/2021   Age:31 hours  Lactation follow-up this morning. Mom has done minimal pumping with DEBP at bedside, she was hand expressing occasionally yesterday.  Mom asks for assistance this morning with pump set-up. She states that she woke up this morning with her shirt wet, so she wants to try pumping again. LC set-up DEBP at beside within reach for mom, re-educated mom on power/stimulation buttons, changing in suction level with nob, and encouraged starting low and building as able.   Encouraged mom to pump routinely, every 2-3 hours, and hand expression as able. Currently mom has a headache, so she stated that she would finish her ice pop and then try to pumping- calling out if she needs any help.   Danford Bad 02/19/2021, 10:05 AM

## 2021-02-19 NOTE — Progress Notes (Signed)
Patient discharged home, infant remains in SCN. Discharge instructions and prescriptions given and reviewed with patient. F/u appointment Tuesday for BP and incision check. Patient verbalized understanding.

## 2021-02-19 NOTE — Progress Notes (Signed)
RN instructed and demonstrated to pt the hand pump; RN discussed fioricet and percocet use; RN instructed pt to not take both because they both have acetaminophen in them; also, per CNM, only use fioricet for a few days, there can be risk for rebound headache with long term use; RN discharged pt at this time via wheelchair; RN escorted pt to medical mall entrance; a friend of the pt is taking her home; pt knows she can come and visit baby in the SCN at any time

## 2021-02-20 LAB — SURGICAL PATHOLOGY

## 2021-02-24 ENCOUNTER — Ambulatory Visit (INDEPENDENT_AMBULATORY_CARE_PROVIDER_SITE_OTHER): Payer: Medicaid Other | Admitting: Obstetrics & Gynecology

## 2021-02-24 ENCOUNTER — Other Ambulatory Visit: Payer: Self-pay

## 2021-02-24 ENCOUNTER — Encounter: Payer: Self-pay | Admitting: Obstetrics & Gynecology

## 2021-02-24 VITALS — BP 120/80 | Ht 62.0 in | Wt 245.0 lb

## 2021-02-24 DIAGNOSIS — Z8632 Personal history of gestational diabetes: Secondary | ICD-10-CM

## 2021-02-24 DIAGNOSIS — Z98891 History of uterine scar from previous surgery: Secondary | ICD-10-CM

## 2021-02-24 MED ORDER — OXYCODONE-ACETAMINOPHEN 5-325 MG PO TABS: 1.0000 | ORAL_TABLET | ORAL | 0 refills | Status: DC | PRN

## 2021-02-24 NOTE — Progress Notes (Signed)
  Postoperative Follow-up Patient presents post op from recent Cesarean Section performed for Abruption, repeat at 34 weeks done , 1 week ago.   Subjective: Patient reports some improvement in her immediate post op symptoms. Eating a regular diet without difficulty. Pain is controlled with current analgesics. Medications being used: ibuprofen (OTC) and narcotic analgesics including oxycodone (Oxycontin, Oxyir).  Activity: normal activities of daily living. Patient reports additional symptom's since surgery of appropriate lochia, no signs of depression, and no signs of mastitis.  Objective: BP 120/80   Ht 5\' 2"  (1.575 m)   Wt 245 lb (111.1 kg)   BMI 44.81 kg/m  Physical Exam Constitutional:      General: She is not in acute distress.    Appearance: She is well-developed.  Cardiovascular:     Rate and Rhythm: Normal rate.  Pulmonary:     Effort: Pulmonary effort is normal.  Abdominal:     General: There is no distension.     Palpations: Abdomen is soft.     Tenderness: There is no abdominal tenderness.     Comments: Incision Healing Well   Musculoskeletal:        General: Normal range of motion.  Neurological:     Mental Status: She is alert and oriented to person, place, and time.     Cranial Nerves: No cranial nerve deficit.  Skin:    General: Skin is warm and dry.     Assessment: s/p : Cesarean Section for Abruption w 34 week scan as she had prior CS progressing well  Plan: Patient has done well after her Cesarean Section with no apparent complications.  I have discussed the post-operative course to date, and the expected progress moving forward.  The patient understands what complications to be concerned about.  I will see the patient in routine follow up, or sooner if needed.    Activity plan: No heavy lifting.  Pelvic rest. She desires oral progesterone-only contraceptive for postpartum contraception.  To start later, based on whether she continues to breast feed/pump  and how that is going for her.  Abstinence until next appt. Edyn doing well in [facility], to go home soon Cont Procardia for now and monitor BP at home    Likely to discontinue use soon or at 6 week check up Gest DM, no meds now, check glucola nv  Marland Kitchen 02/24/2021, 11:06 AM

## 2021-04-02 ENCOUNTER — Other Ambulatory Visit (HOSPITAL_COMMUNITY)
Admission: RE | Admit: 2021-04-02 | Discharge: 2021-04-02 | Disposition: A | Discharge status: Home / Self Care | Payer: Medicaid Other | Source: Ambulatory Visit | Attending: Obstetrics & Gynecology | Admitting: Obstetrics & Gynecology

## 2021-04-02 ENCOUNTER — Ambulatory Visit (INDEPENDENT_AMBULATORY_CARE_PROVIDER_SITE_OTHER): Payer: Medicaid Other | Admitting: Obstetrics & Gynecology

## 2021-04-02 ENCOUNTER — Other Ambulatory Visit: Payer: Self-pay

## 2021-04-02 ENCOUNTER — Encounter: Payer: Self-pay | Admitting: Obstetrics & Gynecology

## 2021-04-02 DIAGNOSIS — Z124 Encounter for screening for malignant neoplasm of cervix: Secondary | ICD-10-CM

## 2021-04-02 DIAGNOSIS — Z98891 History of uterine scar from previous surgery: Secondary | ICD-10-CM

## 2021-04-02 NOTE — Progress Notes (Signed)
  OBSTETRICS POSTPARTUM CLINIC PROGRESS NOTE  Subjective:     [place] is a 30 y.o. [phone] female who presents for a postpartum visit. She is 6 weeks postpartum following a Preterm pregnancy <37 weeks or Pregnancy complicated by: HTN and delivery by C-section.  I have fully reviewed the prenatal and intrapartum course. Anesthesia: spinal.  Postpartum course has been complicated by complicated by none .  Baby is feeding by Bottle.  Bleeding: patient has  resumed menses.  Bowel function is normal. Bladder function is normal.  Patient is not sexually active. Contraception method desired is  uncertain, considering Nexplanon or BTL .  Postpartum depression screening: negative. Edinburgh 4.     Review of Systems Pertinent items are noted in HPI.  Objective:    BP 130/90   Ht 5\' 2"  (1.575 m)   Wt 243 lb (110.2 kg)   BMI 44.45 kg/m   General:  alert and no distress   Breasts:  inspection negative, no nipple discharge or bleeding, no masses or nodularity palpable  Lungs: clear to auscultation bilaterally  Heart:  regular rate and rhythm, S1, S2 normal, no murmur, click, rub or gallop  Abdomen: soft, non-tender; bowel sounds normal; no masses,  no organomegaly.   Well healed Pfannenstiel incision   Vulva:  normal  Vagina: normal vagina, no discharge, exudate, lesion, or erythema  Cervix:  no cervical motion tenderness and no lesions  Corpus: normal size, contour, position, consistency, mobility, non-tender  Adnexa:  normal adnexa and no mass, fullness, tenderness  Rectal Exam: Not performed.          Assessment:  Post Partum Care visit 1. Postpartum care and examination  2. S/P cesarean section  3. Screening for cervical cancer - Cytology - PAP   Plan:  See orders and Patient Instructions Contraceptive counseling for bilateral tubal ligation, or Nexplanon Follow up in:  12  months or as needed.   , MD, Annamarie Major Ob/Gyn, [facility] 04/02/2021  3:52 PM

## 2021-04-02 NOTE — Patient Instructions (Signed)

## 2021-04-08 LAB — CYTOLOGY - PAP
Comment: NEGATIVE
Diagnosis: NEGATIVE
High risk HPV: NEGATIVE

## 2021-05-22 ENCOUNTER — Other Ambulatory Visit: Payer: Self-pay | Admitting: Obstetrics & Gynecology

## 2021-05-22 DIAGNOSIS — Z8632 Personal history of gestational diabetes: Secondary | ICD-10-CM

## 2022-04-01 IMAGING — US US MFM FETAL BPP W/O NON-STRESS
2 series · 12 of 28 positions shown · non-contrast
Comparison: none

[Series 1: ob us · 8 of 26 slices shown (1 of 2)]
[im 2/26]
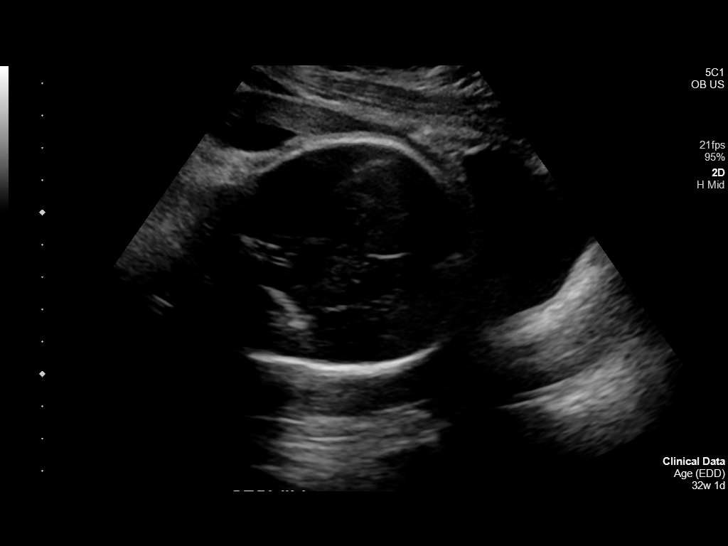
[im 5/26]
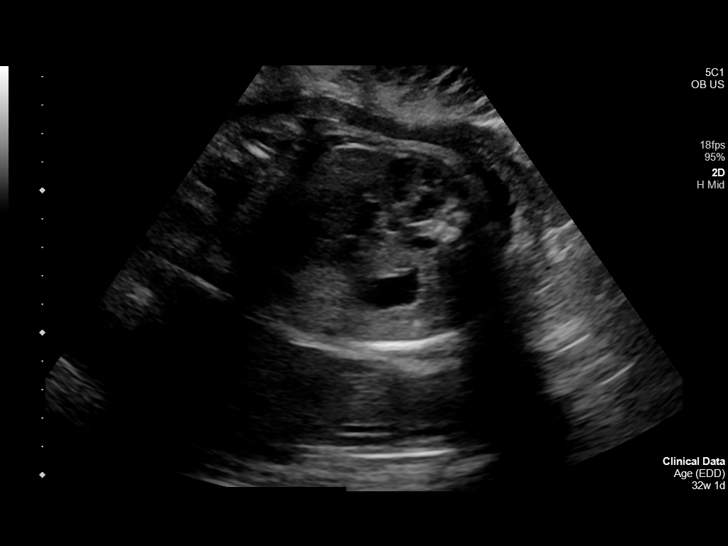
[im 8/26]
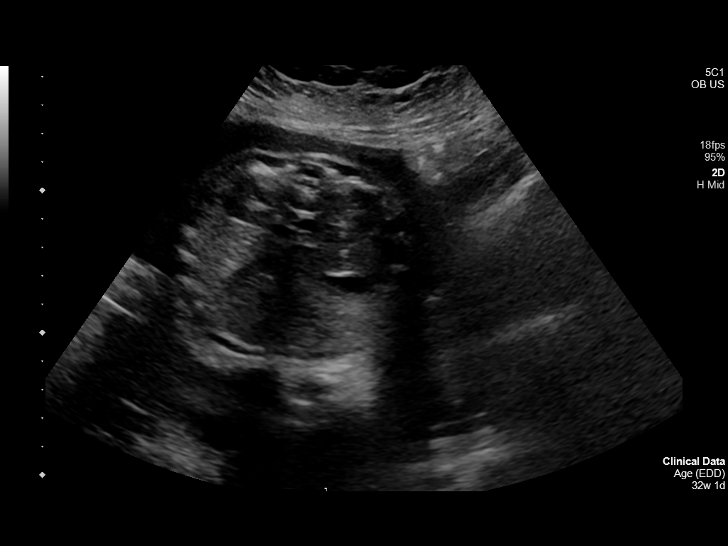
[im 12/26]
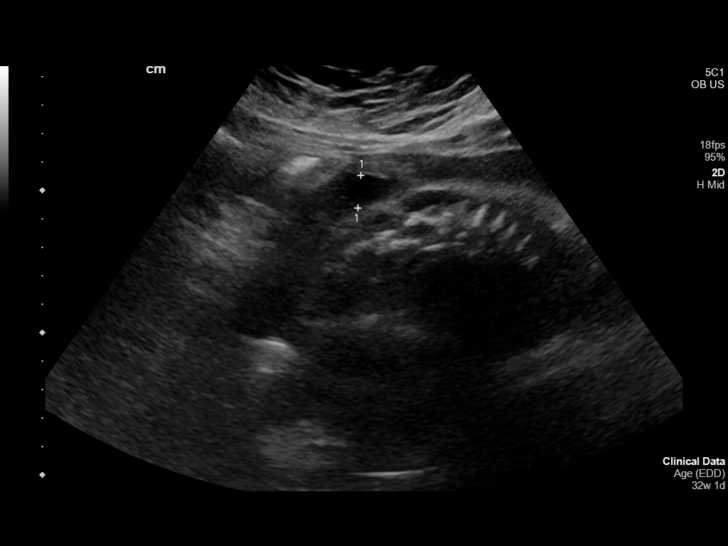
[im 15/26]
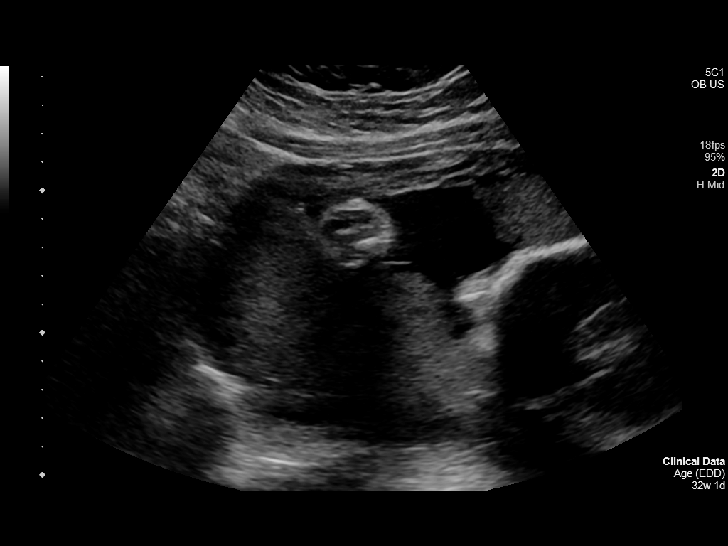
[im 18/26]
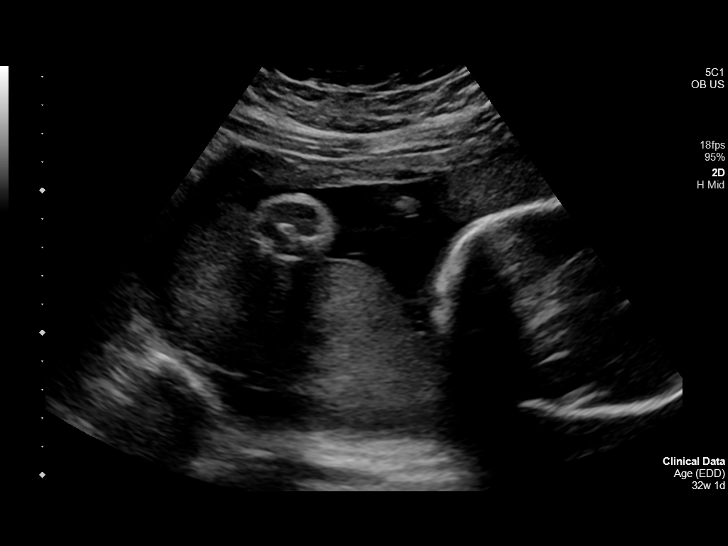
[im 23/26]
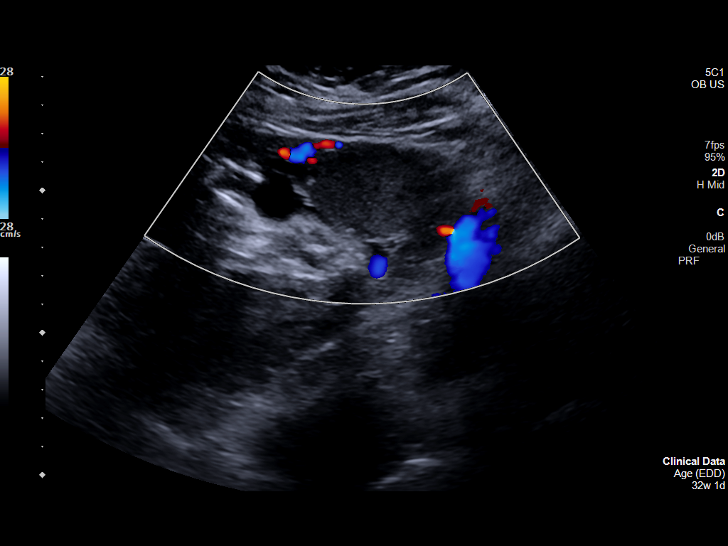
[im 26/26]
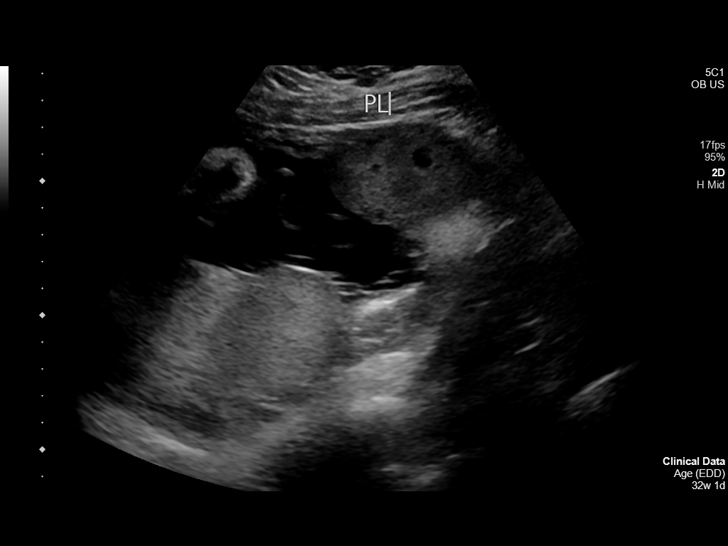

[Series 1001: ob us · 14 acquisitions, 4 frames shown (2 of 2)]
[im 2/14]
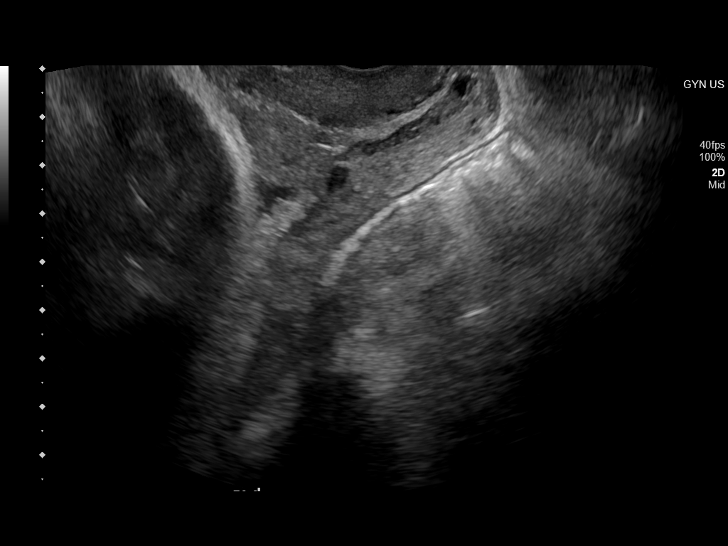
[im 6/14]
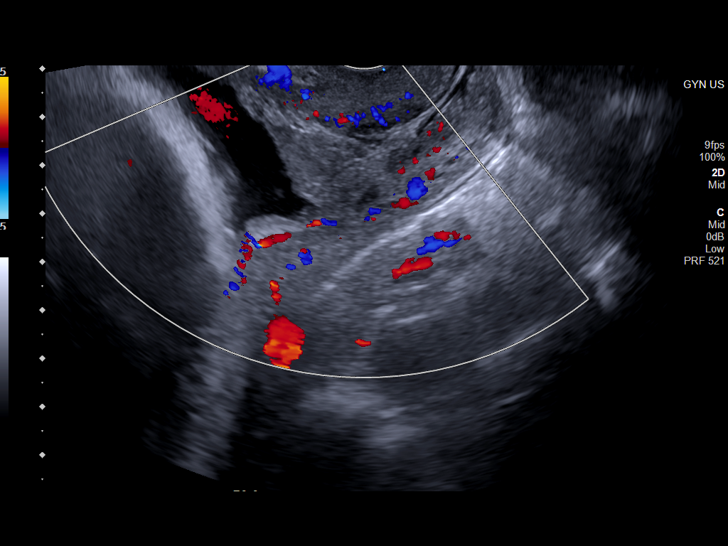
[im 9/14]
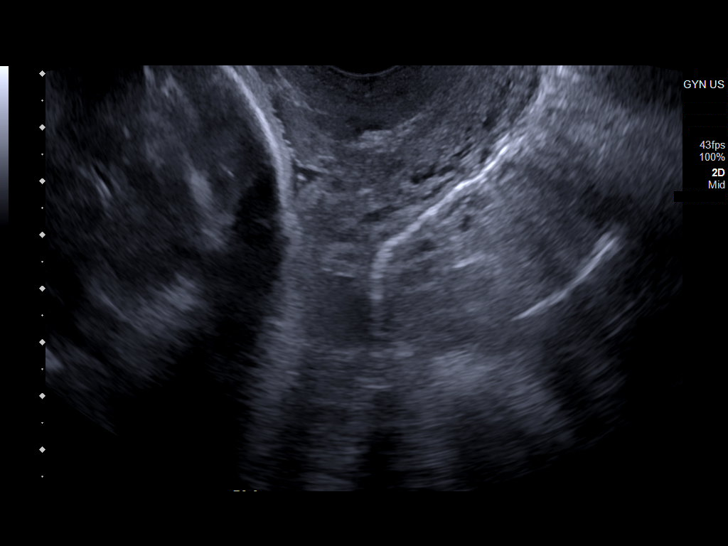
[im 12/14]
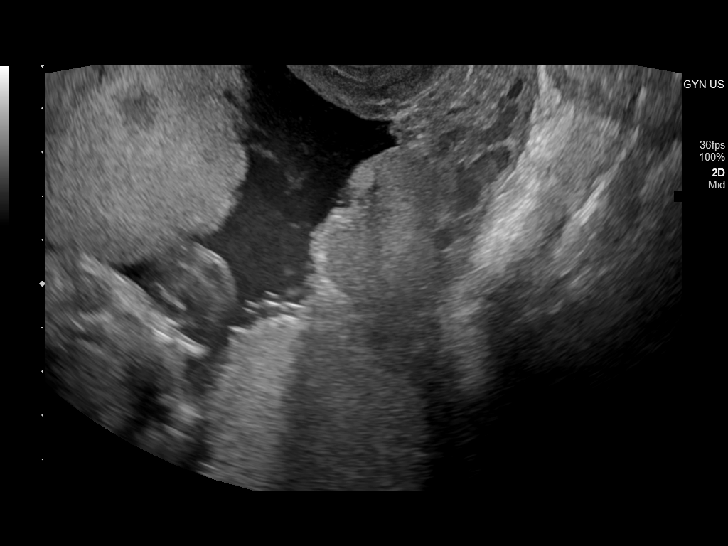

[12 of 28 positions shown; findings below may reference images not displayed]

2  US MFM OB TRANSVAGINAL                76817.2     EZRA WHY

Indications

 32 weeks gestation of pregnancy
 Obesity complicating pregnancy, third
 trimester
 Gestational diabetes in pregnancy, insulin
 controlled
 Poor obstetric history: Previous IUFD
 (stillbirth)
 Vaginal bleeding in pregnancy, third trimester
Fetal Evaluation

 Num Of Fetuses:         1
 Fetal Heart Rate(bpm):  144
 Cardiac Activity:       Observed
 Presentation:           Cephalic
 Placenta:               Posterior Placenta w Succenturiate lobe

 AFI Sum(cm)     %Tile       Largest Pocket(cm)
 12              32

 RUQ(cm)       RLQ(cm)       LUQ(cm)        LLQ(cm)

Biophysical Evaluation

 Amniotic F.V:   Within normal limits       F. Tone:        Observed
 F. Movement:    Observed                   Score:          [DATE]
 F. Breathing:   Observed
Gestational Age

 LMP:           32w 1d        Date:  06/23/20                 EDD:   03/30/21
 Best:          32w 1d     Det. By:  LMP  (06/23/20)          EDD:   03/30/21
Comments

 Ms. Weigel is a G8P1 who is seen in consultation regarding
 vaginal bleeding in the third trimester. Please see formal
 consult in [REDACTED].

 Single intrauterine pregnancy with Biophysical profile [DATE].
 There was a heterogenous collection of likely blood clot near
 the posterior edge of the cervix and also a large anterior 6 x 4
 cm collection that was dynamic element of flow and and a
 posterior vessel. This may be suggest active hemorrhage vs
 recent blood clot.

 Impression/Counseling:

 I discussed with Ms. Weigel that it appears that she may have
 a placental abruption. I defined a placental abruption as a
 painless vaginal bleeding in the third trimester.

 In addition, I reviewed today's images with her in real time
 demonstrating the area of clot both posteriorly and anteriorly.
 I explained the anterior area appears as an accessory lobe
 however, it was not seen on multiple previous exams and due
 to its dynamic nature with irregular borders I suggest that it is
 likely a blood clot.

 We reviewed the the management to include Encontros
 that she has received, maternal and fetal monitoring and
 serial labs (including DIC labs) every 24-48 hours while she
 continues to have evidence of bleeding. Her labs at this time
 are normal again with exception of the elevated UPC.

 We discussed the etiology may include chronic hypertension
 and overall general recurrence given her prior history.

 Management include remaining inpatient through 34 weeks
 with daily testing, with delivery for abnormal fetal testing,
 bleeding and uterine contractions. If she continued to bleed,
 given her history of IUFD we recommend delivery at 34
 weeks. While she remains hospitalized we leave it to the
 providers discretion regarding delivery if any of the above
 mentioned indications are present.

 Ms. Weigel conveyed that her barrier to hospitalization is child
 care for her toddler. However, she will try to find assistance in
 the next 24 hours. I explained that our recommendation is for
 hospitalization but if this is not feasible intensive outpatient
 management is an alternative but suboptimal approach as
 there is a risk for loss of her child if bleeding occurs
 spontaneously. She conveyed her understanding.

 I reviewed this plan of care with Dr. Nawabi who is on call
 and he agrees with this plan of care.

 If she remains stable at 34 weeks we will consider extending
 pregnancy until 35-36 weeks.

 Lastly, continue care protocol for the management of
 superimposed preeclampsia and RUPT2.

 I spent 45 minutes with >50% in face to face consultation and
 care coordination.

 All questions answered.

## 2022-04-03 ENCOUNTER — Other Ambulatory Visit: Payer: Self-pay

## 2022-04-03 ENCOUNTER — Emergency Department
Admission: EM | Admit: 2022-04-03 | Discharge: 2022-04-03 | Disposition: A | Discharge status: Home / Self Care | Payer: Medicaid Other | Attending: Emergency Medicine | Admitting: Emergency Medicine

## 2022-04-03 ENCOUNTER — Emergency Department: Payer: Medicaid Other

## 2022-04-03 DIAGNOSIS — R112 Nausea with vomiting, unspecified: Secondary | ICD-10-CM

## 2022-04-03 DIAGNOSIS — R109 Unspecified abdominal pain: Secondary | ICD-10-CM | POA: Insufficient documentation

## 2022-04-03 DIAGNOSIS — O219 Vomiting of pregnancy, unspecified: Secondary | ICD-10-CM | POA: Insufficient documentation

## 2022-04-03 DIAGNOSIS — O10911 Unspecified pre-existing hypertension complicating pregnancy, first trimester: Secondary | ICD-10-CM | POA: Insufficient documentation

## 2022-04-03 DIAGNOSIS — Z3A01 Less than 8 weeks gestation of pregnancy: Secondary | ICD-10-CM | POA: Insufficient documentation

## 2022-04-03 LAB — COMPREHENSIVE METABOLIC PANEL WITH GFR
ALT: 17 U/L (ref 0–44)
AST: 25 U/L (ref 15–41)
Albumin: 4.2 g/dL (ref 3.5–5.0)
Alkaline Phosphatase: 54 U/L (ref 38–126)
Anion gap: 10 (ref 5–15)
BUN: 12 mg/dL (ref 6–20)
CO2: 22 mmol/L (ref 22–32)
Calcium: 9.2 mg/dL (ref 8.9–10.3)
Chloride: 104 mmol/L (ref 98–111)
Creatinine, Ser: 0.76 mg/dL (ref 0.44–1.00)
GFR, Estimated: >60 mL/min
Glucose, Bld: 205 mg/dL — ABNORMAL HIGH (ref 70–99)
Potassium: 3.2 mmol/L — ABNORMAL LOW (ref 3.5–5.1)
Sodium: 136 mmol/L (ref 135–145)
Total Bilirubin: 0.4 mg/dL (ref 0.3–1.2)
Total Protein: 8.5 g/dL — ABNORMAL HIGH (ref 6.5–8.1)

## 2022-04-03 LAB — URINALYSIS, ROUTINE W REFLEX MICROSCOPIC
Bilirubin Urine: NEGATIVE
Glucose, UA: 50 mg/dL — AB
Hgb urine dipstick: NEGATIVE
Ketones, ur: 80 mg/dL — AB
Leukocytes,Ua: NEGATIVE
Nitrite: NEGATIVE
Protein, ur: >=300 mg/dL — AB
Specific Gravity, Urine: 1.024 (ref 1.005–1.030)
pH: 6 (ref 5.0–8.0)

## 2022-04-03 LAB — HCG, QUANTITATIVE, PREGNANCY: hCG, Beta Chain, Quant, S: 75083 m[IU]/mL — ABNORMAL HIGH (ref <5)

## 2022-04-03 LAB — CHLAMYDIA/NGC RT PCR (ARMC ONLY)
Chlamydia Tr: NOT DETECTED
N gonorrhoeae: NOT DETECTED

## 2022-04-03 LAB — CBC
HCT: 34.1 % — ABNORMAL LOW (ref 36.0–46.0)
Hemoglobin: 11.9 g/dL — ABNORMAL LOW (ref 12.0–15.0)
MCH: 26 pg (ref 26.0–34.0)
MCHC: 34.9 g/dL (ref 30.0–36.0)
MCV: 74.6 fL — ABNORMAL LOW (ref 80.0–100.0)
Platelets: 365 K/uL (ref 150–400)
RBC: 4.57 MIL/uL (ref 3.87–5.11)
RDW: 15.9 % — ABNORMAL HIGH (ref 11.5–15.5)
WBC: 6.8 K/uL (ref 4.0–10.5)
nRBC: 0 % (ref 0.0–0.2)

## 2022-04-03 LAB — POC URINE PREG, ED: Preg Test, Ur: POSITIVE — AB

## 2022-04-03 LAB — LIPASE, BLOOD: Lipase: 25 U/L (ref 11–51)

## 2022-04-03 MED ORDER — ONDANSETRON 4 MG PO TBDP
4.0000 mg | ORAL_TABLET | Freq: Once | ORAL | Status: AC | PRN
  Administered 2022-04-03: 4 mg via ORAL
  Filled 2022-04-03: qty 1

## 2022-04-03 MED ORDER — PROMETHAZINE HCL 25 MG PO TABS: 25.0000 mg | ORAL_TABLET | Freq: Four times a day (QID) | ORAL | 0 refills | Status: DC | PRN

## 2022-04-03 MED ORDER — METOCLOPRAMIDE HCL 5 MG/ML IJ SOLN
10.0000 mg | Freq: Once | INTRAMUSCULAR | Status: AC
  Administered 2022-04-03: 10 mg via INTRAVENOUS
  Filled 2022-04-03: qty 2

## 2022-04-03 MED ORDER — ONDANSETRON HCL 4 MG/2ML IJ SOLN
4.0000 mg | Freq: Once | INTRAMUSCULAR | Status: AC
  Administered 2022-04-03: 4 mg via INTRAVENOUS

## 2022-04-03 MED ORDER — ONDANSETRON 4 MG PO TBDP: 4.0000 mg | ORAL_TABLET | Freq: Three times a day (TID) | ORAL | 0 refills | Status: AC | PRN

## 2022-04-03 MED ORDER — DEXTROSE 5 % IN LACTATED RINGERS IV BOLUS
2000.0000 mL | Freq: Once | INTRAVENOUS | Status: AC
  Administered 2022-04-03: 2000 mL via INTRAVENOUS
  Filled 2022-04-03: qty 2000

## 2022-04-03 MED ORDER — ONDANSETRON HCL 4 MG/2ML IJ SOLN
INTRAMUSCULAR | Status: AC
  Filled 2022-04-03: qty 2

## 2022-04-03 MED ORDER — METOCLOPRAMIDE HCL 10 MG PO TABS: 10.0000 mg | ORAL_TABLET | Freq: Three times a day (TID) | ORAL | 0 refills | Status: DC

## 2022-04-03 MED ORDER — SODIUM CHLORIDE 0.9 % IV BOLUS (SEPSIS)
1000.0000 mL | Freq: Once | INTRAVENOUS | Status: AC
  Administered 2022-04-03: 1000 mL via INTRAVENOUS

## 2022-04-03 NOTE — Discharge Instructions (Signed)
Please take your nausea medication as needed as prescribed.  Please drink plenty of fluids.  Please follow-up with your OB/GYN as soon as possible to discuss your nausea medications.  We do not recommend taking these medications at the same time.  Please choose 1 of these medications and take every 6 hours as needed for nausea.  Return to the emergency department for any symptom personally concerning to yourself.

## 2022-04-03 NOTE — ED Triage Notes (Signed)
Pt with upper abd pain since this am pe rpt with nausea and vomiting. Pt appears in no acute distress. Pt is unsure when LMP was. Pt denies fever.

## 2022-04-03 NOTE — ED Provider Notes (Signed)
[facility] Provider Note    Event Date/Time   First MD Initiated Contact with Patient 04/03/22 [phone]     (approximate)  History   Chief Complaint: Abdominal Pain  HPI  ALEENE SWANNER is a 31 y.o. female with a past medical history of hypertension presents to the emergency department for abdominal discomfort nausea and vomiting.  According to the patient over the past week or so she has been experiencing nausea and vomiting and intermittent abdominal discomfort/cramping.  Patient states she is late with her last menstrual period.  Denies any urinary symptoms no vaginal leading.  Physical Exam   Triage Vital Signs: ED Triage Vitals  Enc Vitals Group     BP 04/03/22 0148 127/83     Pulse Rate 04/03/22 0146 84     Resp 04/03/22 0146 18     Temp 04/03/22 0146 98.5 F (36.9 C)     Temp Source 04/03/22 0146 Oral     SpO2 04/03/22 0146 96 %     Weight 04/03/22 0147 240 lb (108.9 kg)     Height 04/03/22 0147 5\' 2"  (1.575 m)     Head Circumference --      Peak Flow --      Pain Score 04/03/22 0147 6     Pain Loc --      Pain Edu? --      Excl. in GC? --     Most recent vital signs: Vitals:   04/03/22 0340 04/03/22 0711  BP: (!) 138/100 128/72  Pulse: 69 60  Resp: (!) 24 20  Temp:    SpO2: 100% 100%    General: Awake, no distress.  CV:  Good peripheral perfusion.  Regular rate and rhythm  Resp:  Normal effort.  Equal breath sounds bilaterally.  Abd:  No distention.  Soft, nontender.  No rebound or guarding.  Benign abdomen.   ED Results / Procedures / Treatments   EKG  EKG viewed and interpreted by myself shows a normal sinus rhythm at 71 bpm with a narrow QRS, normal axis, normal intervals, no concerning ST changes  RADIOLOGY  Ultrasound shows 7-week 4-day IUP with a heart rate of 155   MEDICATIONS ORDERED IN ED: Medications  ondansetron (ZOFRAN-ODT) disintegrating tablet 4 mg (4 mg Oral Given 04/03/22 0207)  sodium chloride 0.9 % bolus  1,000 mL (0 mLs Intravenous Stopped 04/03/22 0455)  metoCLOPramide (REGLAN) injection 10 mg (10 mg Intravenous Given 04/03/22 0403)  dextrose 5% lactated ringers bolus 2,000 mL (0 mLs Intravenous Stopped 04/03/22 0634)     IMPRESSION / MDM / ASSESSMENT AND PLAN / ED COURSE  I reviewed the triage vital signs and the nursing notes.  Patient's presentation is most consistent with acute presentation with potential threat to life or bodily function.  Patient presents emergency department for nausea vomiting over the past 1 week with abdominal cramping.  Patient found out in the emergency department today that she is pregnant.  Ultrasound confirms 7-week 4-day pregnancy.  Patient states she has had nausea vomiting with her past pregnancies that have been fairly significant requiring promethazine and Zofran and Reglan to be used at different times.  Patient states she will follow-up with OB we will write these medications for the patient.  Patient has no abdominal pain currently.  Lab work today shows a positive urine pregnancy test.  Chemistry overall reassuring.  CBC is reassuring.  Lipase negative.  Urinalysis does not appear to show any urinary tract infection.  Beta hCG of 75,000.  FINAL CLINICAL IMPRESSION(S) / ED DIAGNOSES   Nausea vomiting of pregnancy    Note:  This document was prepared using Dragon voice recognition software and may include unintentional dictation errors.   Minna Antis, MD 04/03/22 [phone]

## 2022-04-03 NOTE — ED Notes (Addendum)
Pt  sitting up on floor of bathroom in waiting room pulling call bell, assisted up to wheelchair by RN x2. Pt vomiting, states she feels weak.  Pt taken to triage room 2 by rachel, rn to have iv initiated, vital signs retaken.

## 2022-04-03 NOTE — ED Notes (Addendum)
Spoke with dr. Katrinka Blazing regarding new pregnancy and zofran administration. Md states ok to administer. Discussed administration with pt, risks of administration with pt, pt verbalizes understanding and states wants zofran. Clarified LMP with pt, she states she had one she thinks in [place] around [place].

## 2023-01-25 ENCOUNTER — Emergency Department
Admission: EM | Admit: 2023-01-25 | Discharge: 2023-01-25 | Disposition: A | Discharge status: Home / Self Care | Payer: Medicaid Other | Attending: Emergency Medicine | Admitting: Emergency Medicine

## 2023-01-25 ENCOUNTER — Other Ambulatory Visit: Payer: Self-pay

## 2023-01-25 DIAGNOSIS — O219 Vomiting of pregnancy, unspecified: Secondary | ICD-10-CM | POA: Diagnosis present

## 2023-01-25 DIAGNOSIS — O23599 Infection of other part of genital tract in pregnancy, unspecified trimester: Secondary | ICD-10-CM

## 2023-01-25 DIAGNOSIS — O99280 Endocrine, nutritional and metabolic diseases complicating pregnancy, unspecified trimester: Secondary | ICD-10-CM | POA: Insufficient documentation

## 2023-01-25 DIAGNOSIS — Z3A00 Weeks of gestation of pregnancy not specified: Secondary | ICD-10-CM | POA: Insufficient documentation

## 2023-01-25 DIAGNOSIS — E876 Hypokalemia: Secondary | ICD-10-CM | POA: Insufficient documentation

## 2023-01-25 DIAGNOSIS — B9689 Other specified bacterial agents as the cause of diseases classified elsewhere: Secondary | ICD-10-CM

## 2023-01-25 LAB — URINALYSIS, ROUTINE W REFLEX MICROSCOPIC
Bilirubin Urine: NEGATIVE
Glucose, UA: NEGATIVE mg/dL
Hgb urine dipstick: NEGATIVE
Ketones, ur: 80 mg/dL — AB
Leukocytes,Ua: NEGATIVE
Nitrite: NEGATIVE
Protein, ur: 100 mg/dL — AB
Specific Gravity, Urine: 1.02 (ref 1.005–1.030)
pH: 5 (ref 5.0–8.0)

## 2023-01-25 LAB — COMPREHENSIVE METABOLIC PANEL
ALT: 21 U/L (ref 0–44)
AST: 23 U/L (ref 15–41)
Albumin: 4.4 g/dL (ref 3.5–5.0)
Alkaline Phosphatase: 50 U/L (ref 38–126)
Anion gap: 11 (ref 5–15)
BUN: 12 mg/dL (ref 6–20)
CO2: 24 mmol/L (ref 22–32)
Calcium: 9 mg/dL (ref 8.9–10.3)
Chloride: 97 mmol/L — ABNORMAL LOW (ref 98–111)
Creatinine, Ser: 0.68 mg/dL (ref 0.44–1.00)
GFR, Estimated: >60 mL/min (ref >60)
Glucose, Bld: 125 mg/dL — ABNORMAL HIGH (ref 70–99)
Potassium: 2.9 mmol/L — ABNORMAL LOW (ref 3.5–5.1)
Sodium: 132 mmol/L — ABNORMAL LOW (ref 135–145)
Total Bilirubin: 1.1 mg/dL (ref 0.3–1.2)
Total Protein: 7.8 g/dL (ref 6.5–8.1)

## 2023-01-25 LAB — POC URINE PREG, ED: Preg Test, Ur: POSITIVE — AB

## 2023-01-25 LAB — WET PREP, GENITAL
Sperm: NONE SEEN
WBC, Wet Prep HPF POC: <10 (ref <10)
Yeast Wet Prep HPF POC: NONE SEEN

## 2023-01-25 LAB — CBC
HCT: 38.5 % (ref 36.0–46.0)
Hemoglobin: 13.5 g/dL (ref 12.0–15.0)
MCH: 27.4 pg (ref 26.0–34.0)
MCHC: 35.1 g/dL (ref 30.0–36.0)
MCV: 78.3 fL — ABNORMAL LOW (ref 80.0–100.0)
Platelets: 344 10*3/uL (ref 150–400)
RBC: 4.92 MIL/uL (ref 3.87–5.11)
RDW: 14.2 % (ref 11.5–15.5)
WBC: 7.9 10*3/uL (ref 4.0–10.5)
nRBC: 0 % (ref 0.0–0.2)

## 2023-01-25 LAB — LIPASE, BLOOD: Lipase: 26 U/L (ref 11–51)

## 2023-01-25 LAB — CHLAMYDIA/NGC RT PCR (ARMC ONLY)
Chlamydia Tr: NOT DETECTED
N gonorrhoeae: NOT DETECTED

## 2023-01-25 MED ORDER — ONDANSETRON HCL 4 MG/2ML IJ SOLN
4.0000 mg | Freq: Once | INTRAMUSCULAR | Status: AC
  Administered 2023-01-25: 4 mg via INTRAVENOUS
  Filled 2023-01-25: qty 2

## 2023-01-25 MED ORDER — POTASSIUM CHLORIDE CRYS ER 20 MEQ PO TBCR
40.0000 meq | EXTENDED_RELEASE_TABLET | Freq: Once | ORAL | Status: AC
  Administered 2023-01-25: 40 meq via ORAL
  Filled 2023-01-25: qty 2

## 2023-01-25 MED ORDER — ONDANSETRON 4 MG PO TBDP: 4.0000 mg | ORAL_TABLET | Freq: Three times a day (TID) | ORAL | 0 refills | Status: AC | PRN

## 2023-01-25 MED ORDER — PROMETHAZINE HCL 12.5 MG PO TABS: 12.5000 mg | ORAL_TABLET | Freq: Four times a day (QID) | ORAL | 0 refills | Status: AC | PRN

## 2023-01-25 MED ORDER — SODIUM CHLORIDE 0.9 % IV BOLUS
1000.0000 mL | Freq: Once | INTRAVENOUS | Status: AC
  Administered 2023-01-25: 1000 mL via INTRAVENOUS

## 2023-01-25 MED ORDER — METOCLOPRAMIDE HCL 10 MG PO TABS: 10.0000 mg | ORAL_TABLET | Freq: Three times a day (TID) | ORAL | 0 refills | Status: AC

## 2023-01-25 MED ORDER — ONDANSETRON 8 MG PO TBDP: 8.0000 mg | ORAL_TABLET | Freq: Three times a day (TID) | ORAL | 0 refills | Status: AC | PRN

## 2023-01-25 MED ORDER — METOCLOPRAMIDE HCL 5 MG/ML IJ SOLN
10.0000 mg | Freq: Once | INTRAMUSCULAR | Status: AC
  Administered 2023-01-25: 10 mg via INTRAVENOUS
  Filled 2023-01-25: qty 2

## 2023-01-25 MED ORDER — METRONIDAZOLE 500 MG/100ML IV SOLN
500.0000 mg | Freq: Once | INTRAVENOUS | Status: AC
  Administered 2023-01-25: 500 mg via INTRAVENOUS
  Filled 2023-01-25: qty 100

## 2023-01-25 NOTE — ED Provider Notes (Signed)
Emergency department handoff note  Care of this patient was signed out to me at the end of the previous provider shift.  All pertinent patient information was conveyed and all questions were answered.  Patient pending wet prep that is positive for trichomoniasis and clue cells.  Will place patient on empiric metronidazole.  The patient has been reexamined and is ready to be discharged.  All diagnostic results have been reviewed and discussed with the patient/family.  Care plan has been outlined and the patient/family understands all current diagnoses, results, and treatment plans.  There are no new complaints, changes, or physical findings at this time.  All questions have been addressed and answered.  All medications, if any, that were given while in the emergency department or any that are being prescribed have been reviewed with the patient/family.  All side effects and adverse reactions have been explained.  Patient was instructed to, and agrees to follow-up with their primary care physician as well as return to the emergency department if any new or worsening symptoms develop.   Merwyn Katos, MD 01/25/23 [phone]

## 2023-01-25 NOTE — ED Provider Notes (Signed)
[facility] Provider Note    Event Date/Time   First MD Initiated Contact with Patient 01/25/23 1157     (approximate)   History   Abdominal Pain   HPI  [place] is a 32 y.o. female who presents to the emergency department today because of concerns for nausea vomiting and abdominal pain.  Patient states that the abdominal pain is a knot in her stomach.  Thinks it is likely secondary to the vomiting.  She did take a home pregnancy test which was negative.  She does state that she has had significant hyperemesis with previous pregnancies. Has been having some abnormal vaginal discharge.     Physical Exam   Triage Vital Signs: ED Triage Vitals  Enc Vitals Group     BP 01/25/23 1039 139/86     Pulse Rate 01/25/23 1039 70     Resp 01/25/23 1039 17     Temp 01/25/23 1039 98.4 F (36.9 C)     Temp Source 01/25/23 1039 Oral     SpO2 01/25/23 1039 100 %     Weight --      Height --      Head Circumference --      Peak Flow --      Pain Score 01/25/23 1045 10     Pain Loc --      Pain Edu? --      Excl. in GC? --     Most recent vital signs: Vitals:   01/25/23 1039  BP: 139/86  Pulse: 70  Resp: 17  Temp: 98.4 F (36.9 C)  SpO2: 100%   General: Awake, alert, oriented. CV:  Good peripheral perfusion. Regular rate and rhythm. Resp:  Normal effort. Lungs clear. Abd:  No distention. Minimal tenderness diffusely.    ED Results / Procedures / Treatments   Labs (all labs ordered are listed, but only abnormal results are displayed) Labs Reviewed  COMPREHENSIVE METABOLIC PANEL - Abnormal; Notable for the following components:      Result Value   Sodium 132 (*)    Potassium 2.9 (*)    Chloride 97 (*)    Glucose, Bld 125 (*)    All other components within normal limits  CBC - Abnormal; Notable for the following components:   MCV 78.3 (*)    All other components within normal limits  URINALYSIS, ROUTINE W REFLEX MICROSCOPIC - Abnormal;  Notable for the following components:   Color, Urine YELLOW (*)    APPearance HAZY (*)    Ketones, ur 80 (*)    Protein, ur 100 (*)    Bacteria, UA RARE (*)    All other components within normal limits  POC URINE PREG, ED - Abnormal; Notable for the following components:   Preg Test, Ur Positive (*)    All other components within normal limits  LIPASE, BLOOD     EKG  None   RADIOLOGY None   PROCEDURES:  Critical Care performed: No   MEDICATIONS ORDERED IN ED: Medications - No data to display   IMPRESSION / MDM / ASSESSMENT AND PLAN / ED COURSE  I reviewed the triage vital signs and the nursing notes.                              Differential diagnosis includes, but is not limited to, hyperemesis, gastroenteritis, pancreatitis  Patient's presentation is most consistent with acute presentation with potential  threat to life or bodily function.   The patient is on the cardiac monitor to evaluate for evidence of arrhythmia and/or significant heart rate changes.  Patient presented to the emergency department today because of concerns for nausea vomiting and some diffuse abdominal discomfort.  On exam patient does have diffuse minimal abdominal tenderness.  Workup is significant for positive pregnancy test.  No concerning leukocytosis or lipase elevation.  Blood work does show hypokalemia and evidence of dehydration.  Will give IV fluids and potassium here in the emergency department.  Will give antibiotics.  In addition will have patient self swab.  Awaiting self swab at time of signout.  Patient is feeling better after IV fluids and nausea medication.      FINAL CLINICAL IMPRESSION(S) / ED DIAGNOSES   Pregnancy Nausea and vomiting  Note:  This document was prepared using Dragon voice recognition software and may include unintentional dictation errors.    Phineas Semen, MD 01/25/23 [phone]

## 2023-01-25 NOTE — ED Triage Notes (Signed)
Pt come with c/o abdominal pain and vomiting since last week. Pt stats sore all over.

## 2023-01-25 NOTE — Discharge Instructions (Addendum)
Please seek medical attention for any high fevers, chest pain, shortness of breath, change in behavior, persistent vomiting, bloody stool or any other new or concerning symptoms.  

## 2023-01-26 LAB — URINE CULTURE: Culture: NO GROWTH

## 2023-01-26 LAB — HIV ANTIBODY (ROUTINE TESTING W REFLEX): HIV Screen 4th Generation wRfx: NONREACTIVE
